# Patient Record
Sex: Female | Born: 1990 | Race: Black or African American | Hispanic: No | Marital: Single | Smoking: Never smoker
Health system: Southern US, Community
[De-identification: ages and names within clinical notes are randomized; demographics above are authoritative.]

## PROBLEM LIST (undated history)

## (undated) DIAGNOSIS — J069 Acute upper respiratory infection, unspecified: Secondary | ICD-10-CM

## (undated) DIAGNOSIS — J45909 Unspecified asthma, uncomplicated: Secondary | ICD-10-CM

## (undated) DIAGNOSIS — T783XXA Angioneurotic edema, initial encounter: Secondary | ICD-10-CM

## (undated) DIAGNOSIS — L509 Urticaria, unspecified: Secondary | ICD-10-CM

## (undated) DIAGNOSIS — B009 Herpesviral infection, unspecified: Secondary | ICD-10-CM

## (undated) DIAGNOSIS — R569 Unspecified convulsions: Secondary | ICD-10-CM

## (undated) DIAGNOSIS — I959 Hypotension, unspecified: Secondary | ICD-10-CM

## (undated) DIAGNOSIS — F32A Depression, unspecified: Secondary | ICD-10-CM

## (undated) HISTORY — DX: Acute upper respiratory infection, unspecified: J06.9

## (undated) HISTORY — DX: Angioneurotic edema, initial encounter: T78.3XXA

## (undated) HISTORY — DX: Urticaria, unspecified: L50.9

---

## 2014-11-10 ENCOUNTER — Emergency Department (HOSPITAL_COMMUNITY)
Admission: EM | Admit: 2014-11-10 | Discharge: 2014-11-10 | Disposition: A | Discharge status: Home / Self Care | Payer: Self-pay | Attending: Emergency Medicine | Admitting: Emergency Medicine

## 2014-11-10 ENCOUNTER — Encounter (HOSPITAL_COMMUNITY): Payer: Self-pay | Admitting: Emergency Medicine

## 2014-11-10 ENCOUNTER — Emergency Department (HOSPITAL_COMMUNITY): Payer: Self-pay

## 2014-11-10 DIAGNOSIS — Z3202 Encounter for pregnancy test, result negative: Secondary | ICD-10-CM | POA: Insufficient documentation

## 2014-11-10 DIAGNOSIS — R0789 Other chest pain: Secondary | ICD-10-CM | POA: Insufficient documentation

## 2014-11-10 DIAGNOSIS — Z7951 Long term (current) use of inhaled steroids: Secondary | ICD-10-CM | POA: Insufficient documentation

## 2014-11-10 DIAGNOSIS — R0781 Pleurodynia: Secondary | ICD-10-CM

## 2014-11-10 DIAGNOSIS — Z79899 Other long term (current) drug therapy: Secondary | ICD-10-CM | POA: Insufficient documentation

## 2014-11-10 DIAGNOSIS — J45901 Unspecified asthma with (acute) exacerbation: Secondary | ICD-10-CM | POA: Insufficient documentation

## 2014-11-10 HISTORY — DX: Unspecified asthma, uncomplicated: J45.909

## 2014-11-10 LAB — COMPREHENSIVE METABOLIC PANEL
ALT: 15 U/L (ref 14–54)
AST: 19 U/L (ref 15–41)
Albumin: 3.9 g/dL (ref 3.5–5.0)
Alkaline Phosphatase: 51 U/L (ref 38–126)
Anion gap: 8 (ref 5–15)
BUN: 12 mg/dL (ref 6–20)
CO2: 26 mmol/L (ref 22–32)
Calcium: 9.1 mg/dL (ref 8.9–10.3)
Chloride: 104 mmol/L (ref 101–111)
Creatinine, Ser: 0.81 mg/dL (ref 0.44–1.00)
GFR calc Af Amer: >60 mL/min (ref >60)
GFR calc non Af Amer: >60 mL/min (ref >60)
Glucose, Bld: 87 mg/dL (ref 65–99)
Potassium: 3.8 mmol/L (ref 3.5–5.1)
Sodium: 138 mmol/L (ref 135–145)
Total Bilirubin: 0.6 mg/dL (ref 0.3–1.2)
Total Protein: 7.2 g/dL (ref 6.5–8.1)

## 2014-11-10 LAB — D-DIMER, QUANTITATIVE: D-Dimer, Quant: 0.42 ug/mL-FEU (ref 0.00–0.48)

## 2014-11-10 LAB — CBC WITH DIFFERENTIAL/PLATELET
Basophils Absolute: 0 10*3/uL (ref 0.0–0.1)
Basophils Relative: 0 % (ref 0–1)
Eosinophils Absolute: 0.2 10*3/uL (ref 0.0–0.7)
Eosinophils Relative: 4 % (ref 0–5)
HCT: 37.5 % (ref 36.0–46.0)
Hemoglobin: 12.4 g/dL (ref 12.0–15.0)
Lymphocytes Relative: 38 % (ref 12–46)
Lymphs Abs: 2.1 10*3/uL (ref 0.7–4.0)
MCH: 27.1 pg (ref 26.0–34.0)
MCHC: 33.1 g/dL (ref 30.0–36.0)
MCV: 81.9 fL (ref 78.0–100.0)
Monocytes Absolute: 0.4 10*3/uL (ref 0.1–1.0)
Monocytes Relative: 7 % (ref 3–12)
Neutro Abs: 2.8 10*3/uL (ref 1.7–7.7)
Neutrophils Relative %: 51 % (ref 43–77)
Platelets: 288 10*3/uL (ref 150–400)
RBC: 4.58 MIL/uL (ref 3.87–5.11)
RDW: 12.7 % (ref 11.5–15.5)
WBC: 5.6 10*3/uL (ref 4.0–10.5)

## 2014-11-10 LAB — I-STAT TROPONIN, ED: Troponin i, poc: 0 ng/mL (ref 0.00–0.08)

## 2014-11-10 LAB — POC URINE PREG, ED: Preg Test, Ur: NEGATIVE

## 2014-11-10 MED ORDER — IPRATROPIUM-ALBUTEROL 0.5-2.5 (3) MG/3ML IN SOLN
3.0000 mL | Freq: Once | RESPIRATORY_TRACT | Status: AC
  Administered 2014-11-10: 3 mL via RESPIRATORY_TRACT
  Filled 2014-11-10: qty 3

## 2014-11-10 MED ORDER — TRAMADOL HCL 50 MG PO TABS: 50.0000 mg | ORAL_TABLET | Freq: Four times a day (QID) | ORAL | Status: DC | PRN

## 2014-11-10 MED ORDER — OXYCODONE-ACETAMINOPHEN 5-325 MG PO TABS
1.0000 | ORAL_TABLET | Freq: Once | ORAL | Status: AC
  Administered 2014-11-10: 1 via ORAL
  Filled 2014-11-10: qty 1

## 2014-11-10 NOTE — Discharge Instructions (Signed)
°Emergency Department Resource Guide °1) Find a Doctor and Pay Out of Pocket °Although you won't have to find out who is covered by your insurance plan, it is a good idea to ask around and get recommendations. You will then need to call the office and see if the doctor you have chosen will accept you as a new patient and what types of options they offer for patients who are self-pay. Some doctors offer discounts or will set up payment plans for their patients who do not have insurance, but you will need to ask so you aren't surprised when you get to your appointment. ° °2) Contact Your Local Health Department °Not all health departments have doctors that can see patients for sick visits, but many do, so it is worth a call to see if yours does. If you don't know where your local health department is, you can check in your phone book. The CDC also has a tool to help you locate your state's health department, and many state websites also have listings of all of their local health departments. ° °3) Find a Walk-in Clinic °If your illness is not likely to be very severe or complicated, you may want to try a walk in clinic. These are popping up all over the country in pharmacies, drugstores, and shopping centers. They're usually staffed by nurse practitioners or physician assistants that have been trained to treat common illnesses and complaints. They're usually fairly quick and inexpensive. However, if you have serious medical issues or chronic medical problems, these are probably not your best option. ° °No Primary Care Doctor: °- Call [facility] at  832-8000 - they can help you locate a primary care doctor that  accepts your insurance, provides certain services, etc. °- Physician Referral Service- 1-800-533-3463 ° °Chronic Pain Problems: °Organization         Address  Phone   Notes  °[facility]  (336) 297-2271 Patients need to be referred by their primary care doctor.  ° °Medication  Assistance: °Organization         Address  Phone   Notes  °[place] [facility] [address] °[place], [place] [address] (336) 641-8030 --Must be a resident of [place] °-- Must have NO insurance coverage whatsoever (no Medicaid/ Medicare, etc.) °-- The pt. MUST have a primary care doctor that directs their care regularly and follows them in the community °  °[facility]  (866) 331-1348   °[employer]  (888) 892-1162   ° °Agencies that provide inexpensive medical care: °Organization         Address  Phone   Notes  °[facility]  (336) 832-8035   °[facility]    (336) 832-7272   °[facility] [address] °[place], [place] [address] (336) 832-4777   °[facility] [address], °[place] (336) 271-4999   °[facility]    (336) 373-0678   °[facility]    (336) 272-1050   °[facility] ° [address], [place] Phone:  (336) 832-4444, Fax:  (336) 832-4440 Hours of Operation:  9 am - 6 pm, M-F.  Also accepts Medicaid/Medicare and self-pay.  °[facility] ° [address],  Phone: (336) 832-3150, Fax: (336) 832-3151. Hours of Operation:  8:30 am - 5:30 pm, M-F.  Also accepts Medicaid and self-pay.  °[facility] [place] [address], [place] Phone: (336) 878-6027   °[facility] [address], [place], [place] (336)723-1848, Ext. 123 Mondays & Thursdays: 7-9 AM.  First 15 patients are seen on a first come, first serve basis. °  ° °Medicaid-accepting [place] Providers: ° °Organization         Address  Phone   Notes  °[facility] [address], [place] (336) 641-2100 Also accepts self-pay patients.  °[facility] [address], [place] ° (336) 856-9996   °[facility] [address], [place]  (336) 288-8857   °[facility] [address] [place] [address], [place] (336) 299-7000   °Veita Bland [address], [place]  ° (336) 373-1557 Only accepts [place] Access Medicaid patients after they have their name applied to their card.  ° °Self-Pay (no insurance) in [place]: ° °Organization         Address  Phone   Notes  °Sickle Cell Patients, [facility] [address], [place] (336) 832-1970   °[facility] [address], [place] (336) 832-4400   °[facility] ° [address], [place] (336) 992-4800   °[facility]/Dr. Osei-Bonsu ° [address] [place] [address], [place] or [address], [place] (336) 841-8500 Phone number for both [place] and [place] locations is the same.  °[facility] [address], [place] (336) 299-0000   °[facility] [address] [place] [address], [place] or [address] (336) 852-7530 °(336) 878-2260   °[facility] [address], [place] (336) 350-1642, phone; (336) 294-5005, fax Sees patients 1st and 3rd Saturday of every month.  Must not qualify for public or private insurance (i.e. Medicaid, Medicare, [place] Health Choice, Veterans' Benefits) • Household income should be no more than 200% of the poverty level •The clinic cannot treat you if you are pregnant or think you are pregnant • Sexually transmitted diseases are not treated at the clinic.  ° ° °Dental Care: °Organization         Address  Phone  Notes  °[place] [facility] [address], [place] (336) 641-6152 Accepts children up to age 21 who are enrolled in Medicaid or [place] Health Choice; pregnant women with a Medicaid card; and children who have applied for Medicaid or [place] Health Choice, but were declined, whose parents can pay a reduced fee at time of service.  °[place]  [facility] [place]  [address], [place] (336) 641-7733 Accepts children up to age 21 who are enrolled in Medicaid or [place] Health Choice; pregnant women with a Medicaid card; and children who have applied for Medicaid or  Health Choice, but were declined, whose parents can pay a reduced fee at time of service.  °[facility] ° [address], [place] (336) 641-4533 Patients are seen by appointment only. Walk-ins are not accepted. [facility] will see patients 18 years of age and older. °Monday - Tuesday (8am-5pm) °Most Wednesdays (8:30-5pm) °$30 per visit, cash only  °[facility] ° [address], [place] (336) 641-4533 Patients are seen by appointment only. Walk-ins are not accepted. [facility] will see patients 18 years of age and older. °One   Wednesday Evening (Monthly: Volunteer Based).  $30 per visit, cash only  °[facility]  (919) 537-3737 for adults; Children under age 4, call Graduate Pediatric Dentistry at (919) [phone]. Children aged 4-14, please call (919) [phone] to request a pediatric application. ° Dental services are provided in all areas of dental care including fillings, crowns and bridges, complete and partial dentures, implants, gum treatment, root canals, and extractions. Preventive care is also provided. Treatment is provided to both adults and children. °Patients are selected via a lottery and there is often a waiting list. °  °[facility] [address], °[place] ° (336) 763-8833 [email or web address] °  °[facility] [address], [place], [place] (336)723-1848, Ext. 123 Second and Fourth Thursday of each month, opens at 6:30 AM; Clinic ends at 9 AM.  Patients are seen on a first-come first-served basis, and a limited number are seen during each clinic.  ° °[facility] ° [address], [place], [place] (336) 723-7904    Eligibility Requirements °You must have lived in [place], [place], or [place] counties for at least the last three months. °  You cannot be eligible for state or federal sponsored healthcare insurance, including Veterans Administration, Medicaid, or Medicare. °  You generally cannot be eligible for healthcare insurance through your employer.  °  How to apply: °Eligibility screenings are held every Tuesday and Wednesday afternoon from 1:00 pm until 4:00 pm. You do not need an appointment for the interview!  °[facility] [address], [place], [place] 336-631-2330   °[facility] [facility]  336-342-8273   °[facility]  336-703-3100   °[facility]  336-570-6415   ° °Behavioral Health Resources in the Community: °Intensive Outpatient Programs °Organization         Address  Phone  Notes  °[facility] [address], [place], [place] 336-878-6098   °[facility] [address], [place], [place] 336-832-9800   °[facility] [address], [place], [place] ° 336-882-2125   °[facility] [address],  °[place], [place] 1-800-853-5163 or 336-641-4981   °Substance Abuse Resources °Organization         Address  Phone  Notes  °[facility]  336-882-2125   °[facility]  336-784-9470   °[facility]  336-285-9073   °[facility]  336-845-3988   °Residential & Outpatient Substance Abuse Program  1-800-659-3381   °Psychological Services °Organization         Address  Phone  Notes  °[facility]  336- 832-9600   °[facility]  336- 378-7881   °[facility] [address], [place] 1-800-853-5163 or 336-641-4981   ° °Mobile Crisis Teams °Organization         Address  Phone  Notes  °[facility], Mobile Crisis Care Unit  1-877-626-1772   °Assertive °[facility] ° [address]  [place], [place] 336-834-9664   °Sharon DeEsch [address] °[place] 336-554-5454   ° °Self-Help/Support Groups °Organization         Address  Phone             Notes  °[facility]  - variety of support groups  336- 373-1402 Call for more information  °Narcotics Anonymous (NA), [facility] [address], °[place]   2 meetings at this location  ° °  Residential Treatment Programs °Organization         Address  Phone  Notes  °[facility] [address],    °[place]  1-866-801-8205   °[facility] ° [address], [place], [place] 704-293-8524   °[facility] [address], [place] 336-845-3988 Admissions: 8am-3pm M-F  °[facility] [address]    °[place], [place] 336-841-1104   °[facility] [address], [place], [place] 336-379-7146   °[facility] [address]  °[place], [place] 336-285-9073   °[facility] Intensive Outpatient [address], [place], [place] 336-852-3033   °[facility] [address]  °[place], [place] 1-877-615-2722 or 336-784-9470   °[facility] [address] [place], [place] 336-227-7417 Accepts Medicaid  °[facility] [address]  °[place] 1-800-659-3381 Substance Abuse/Addiction Treatment  ° °[place] Behavioral Health Resources °Organization         Address  Phone  Notes  °[facility]  (888) 581-9988   °Julie Brannon, PhD [address] [place], [place]   (336) 349-5553 or (336) 951-0000   °[facility]   [address] °[place], [place] (336) 349-4454   °[facility] [address], [place], [place] (336) 342-8316 Insurance/Medicaid/sponsorship through [facility]  °[facility] [address]                                    [place], [place] (336) 342-8316 Therapy/tele-psych/case    °[facility] [address]  ° [place], [place] (336) 349-2233    °Dr. Arfeen  (336) 349-4544   °[facility] [place]  [employer] [place] [facility] 1) [address], [place] °2) [address], [place] °3)  [address], [place] (336) 349-3220 °(336) 342-7768 ° °(336) 342-8140   °[place] [facility] (336) 342-1394 or (336) 342-3537 (After Hours)    ° ° °

## 2014-11-10 NOTE — ED Provider Notes (Signed)
CSN: [id number]     Arrival date & time 11/10/14  0827 History   First MD Initiated Contact with Patient 11/10/14 [phone]     Chief Complaint  Patient presents with  . Asthma     (Consider location/radiation/quality/duration/timing/severity/associated sxs/prior Treatment) Patient is a 24 y.o. female presenting with asthma and shortness of breath. The history is provided by the patient.  Asthma Associated symptoms include chest pain and shortness of breath. Pertinent negatives include no abdominal pain and no headaches.  Shortness of Breath Severity:  Mild Onset quality:  Sudden Duration:  1 day Timing:  Constant Progression:  Worsening Chronicity:  Recurrent Context comment:  At rest Relieved by:  Nothing Worsened by:  Nothing tried Ineffective treatments: inhaler, ibuprofen. Associated symptoms: chest pain and cough   Associated symptoms: no abdominal pain, no fever, no headaches, no neck pain and no vomiting     Past Medical History  Diagnosis Date  . Asthma    Past Surgical History  Procedure Laterality Date  . Cesarean section     No family history on file. History  Substance Use Topics  . Smoking status: Never Smoker   . Smokeless tobacco: Not on file  . Alcohol Use: No   OB History    No data available     Review of Systems  Constitutional: Negative for fever and fatigue.  HENT: Negative for congestion and drooling.   Eyes: Negative for pain.  Respiratory: Positive for cough and shortness of breath.   Cardiovascular: Positive for chest pain.  Gastrointestinal: Negative for nausea, vomiting, abdominal pain and diarrhea.  Genitourinary: Negative for dysuria and hematuria.  Musculoskeletal: Negative for back pain, gait problem and neck pain.  Skin: Negative for color change.  Neurological: Negative for dizziness and headaches.  Hematological: Negative for adenopathy.  Psychiatric/Behavioral: Negative for behavioral problems.  All other systems reviewed and  are negative.     Allergies  Effexor  Home Medications   Prior to Admission medications   Medication Sig Start Date End Date Taking? Authorizing Provider  albuterol (PROVENTIL HFA;VENTOLIN HFA) 108 (90 BASE) MCG/ACT inhaler Inhale 2 puffs into the lungs every 6 (six) hours as needed for wheezing or shortness of breath.   Yes Historical Provider, MD  budesonide-formoterol (SYMBICORT) 160-4.5 MCG/ACT inhaler Inhale 2 puffs into the lungs 2 (two) times daily.   Yes Historical Provider, MD  ibuprofen (ADVIL,MOTRIN) 200 MG tablet Take 400 mg by mouth every 6 (six) hours as needed for moderate pain.   Yes Historical Provider, MD   BP 105/64 mmHg  Pulse 63  Temp(Src) 98.4 F (36.9 C) (Oral)  Resp 20  SpO2 100%  LMP 10/19/2014 Physical Exam  Constitutional: She is oriented to person, place, and time. She appears well-developed and well-nourished.  HENT:  Head: Normocephalic.  Mouth/Throat: Oropharynx is clear and moist. No oropharyngeal exudate.  Eyes: Conjunctivae and EOM are normal. Pupils are equal, round, and reactive to light.  Neck: Normal range of motion. Neck supple.  Cardiovascular: Normal rate, regular rhythm, normal heart sounds and intact distal pulses.  Exam reveals no gallop and no friction rub.   No murmur heard. Pulmonary/Chest: Effort normal and breath sounds normal. No respiratory distress. She has no wheezes. She exhibits tenderness (mild tenderness to palpation of the left anterior chest below the left breast.).  Abdominal: Soft. Bowel sounds are normal. There is no tenderness. There is no rebound and no guarding.  Musculoskeletal: Normal range of motion. She exhibits no edema or tenderness.  Neurological: She is alert and oriented to person, place, and time.  Skin: Skin is warm and dry.  Psychiatric: She has a normal mood and affect. Her behavior is normal.  Nursing note and vitals reviewed.   ED Course  Procedures (including critical care time) Labs  Review Labs Reviewed  CBC WITH DIFFERENTIAL/PLATELET  COMPREHENSIVE METABOLIC PANEL  D-DIMER, QUANTITATIVE (NOT AT [facility])  POC URINE PREG, ED  Rosezena SensorI-STAT TROPOININ, ED    Imaging Review Dg Chest 2 View  11/10/2014   CLINICAL DATA:  Shortness of breath and left-sided chest pain for 1 day  EXAM: CHEST  2 VIEW  COMPARISON:  None.  FINDINGS: Lungs are clear. Heart size and pulmonary vascularity are normal. No adenopathy. No pneumothorax. No bone lesions. There is slight upper lumbar levoscoliosis.  IMPRESSION: No abnormality noted beyond slight upper lumbar levoscoliosis.   Electronically Signed   By: Bretta BangWilliam  Woodruff III M.D.   On: 11/10/2014 09:27     EKG Interpretation   Date/Time:  Wednesday November 10 2014 09:32:30 EDT Ventricular Rate:  63 PR Interval:  141 QRS Duration: 89 QT Interval:  421 QTC Calculation: 431 R Axis:   33 Text Interpretation:  Sinus rhythm Atrial premature complexes in couplets  Confirmed by Thanos Cousineau  MD, Marvine Encalade ([id number]) on 11/10/2014 9:44:35 AM      MDM   Final diagnoses:  Pleuritic pain    9:18 AM 24 y.o. female with a history of asthma and pleurisy who presents with left-sided pleuritic chest pain under her left breast since yesterday. She notes she has had similar symptoms several times per month since she was 24 years old. She also notes a history of a blood clot in her leg previously. She is not on anticoagulation. She states the pain started yesterday suddenly while at work. She initially had some wheezing but this resolved with albuterol inhaler. Vital signs unremarkable here. Low risk wells. Will get labs/imaging, pain control, d-dimer. Lung sounds clear.   12:53 PM: I interpreted/reviewed the labs and/or imaging which were non-contributory.  Pt continues to appear well.  I have discussed the diagnosis/risks/treatment options with the patient and believe the pt to be eligible for discharge home to follow-up with and estab w/ a pcp. We also discussed  returning to the ED immediately if new or worsening sx occur. We discussed the sx which are most concerning (e.g., worsening pain, sob, fever) that necessitate immediate return. Medications administered to the patient during their visit and any new prescriptions provided to the patient are listed below.  Medications given during this visit Medications  ipratropium-albuterol (DUONEB) 0.5-2.5 (3) MG/3ML nebulizer solution 3 mL (3 mLs Nebulization Given 11/10/14 0940)  oxyCODONE-acetaminophen (PERCOCET/ROXICET) 5-325 MG per tablet 1 tablet (1 tablet Oral Given 11/10/14 0929)    New Prescriptions   TRAMADOL (ULTRAM) 50 MG TABLET    Take 1 tablet (50 mg total) by mouth every 6 (six) hours as needed.     Purvis SheffieldForrest Columbus Ice, MD 11/10/14 1254

## 2014-11-10 NOTE — ED Notes (Signed)
Per pt, history of asthma, states she was diagnosed with pleurisy-recently moved here from [place]-wheezing and chest tightness

## 2014-11-12 ENCOUNTER — Emergency Department (HOSPITAL_COMMUNITY)
Admission: EM | Admit: 2014-11-12 | Discharge: 2014-11-12 | Disposition: A | Discharge status: Home / Self Care | Payer: Self-pay | Attending: Emergency Medicine | Admitting: Emergency Medicine

## 2014-11-12 ENCOUNTER — Encounter (HOSPITAL_COMMUNITY): Payer: Self-pay | Admitting: Emergency Medicine

## 2014-11-12 DIAGNOSIS — B9689 Other specified bacterial agents as the cause of diseases classified elsewhere: Secondary | ICD-10-CM

## 2014-11-12 DIAGNOSIS — R0781 Pleurodynia: Secondary | ICD-10-CM

## 2014-11-12 DIAGNOSIS — Z3202 Encounter for pregnancy test, result negative: Secondary | ICD-10-CM | POA: Insufficient documentation

## 2014-11-12 DIAGNOSIS — N760 Acute vaginitis: Secondary | ICD-10-CM | POA: Insufficient documentation

## 2014-11-12 DIAGNOSIS — Z79899 Other long term (current) drug therapy: Secondary | ICD-10-CM | POA: Insufficient documentation

## 2014-11-12 DIAGNOSIS — J45901 Unspecified asthma with (acute) exacerbation: Secondary | ICD-10-CM | POA: Insufficient documentation

## 2014-11-12 DIAGNOSIS — R0789 Other chest pain: Secondary | ICD-10-CM | POA: Insufficient documentation

## 2014-11-12 LAB — URINALYSIS, ROUTINE W REFLEX MICROSCOPIC
Bilirubin Urine: NEGATIVE
Glucose, UA: NEGATIVE mg/dL
Ketones, ur: NEGATIVE mg/dL
Nitrite: NEGATIVE
Protein, ur: NEGATIVE mg/dL
Specific Gravity, Urine: 1.019 (ref 1.005–1.030)
Urobilinogen, UA: 0.2 mg/dL (ref 0.0–1.0)
pH: 6.5 (ref 5.0–8.0)

## 2014-11-12 LAB — URINE MICROSCOPIC-ADD ON

## 2014-11-12 LAB — WET PREP, GENITAL
Trich, Wet Prep: NONE SEEN
Yeast Wet Prep HPF POC: NONE SEEN

## 2014-11-12 LAB — PREGNANCY, URINE: Preg Test, Ur: NEGATIVE

## 2014-11-12 MED ORDER — IPRATROPIUM-ALBUTEROL 0.5-2.5 (3) MG/3ML IN SOLN
3.0000 mL | RESPIRATORY_TRACT | Status: DC
  Administered 2014-11-12: 3 mL via RESPIRATORY_TRACT
  Filled 2014-11-12: qty 3

## 2014-11-12 MED ORDER — METRONIDAZOLE 500 MG PO TABS: 500.0000 mg | ORAL_TABLET | Freq: Two times a day (BID) | ORAL | Status: DC

## 2014-11-12 MED ORDER — ALBUTEROL SULFATE HFA 108 (90 BASE) MCG/ACT IN AERS
2.0000 | INHALATION_SPRAY | Freq: Once | RESPIRATORY_TRACT | Status: AC
  Administered 2014-11-12: 2 via RESPIRATORY_TRACT
  Filled 2014-11-12: qty 6.7

## 2014-11-12 MED ORDER — OXYCODONE-ACETAMINOPHEN 5-325 MG PO TABS: 1.0000 | ORAL_TABLET | Freq: Once | ORAL | Status: DC

## 2014-11-12 NOTE — ED Notes (Signed)
Unable to obtain labs due to pt not laying still in bed rn aware

## 2014-11-12 NOTE — ED Notes (Signed)
RN and phlebotomist attempted lab draw.

## 2014-11-12 NOTE — ED Provider Notes (Signed)
CSN: [id number]     Arrival date & time 11/12/14  1734 History   First MD Initiated Contact with Patient 11/12/14 1744     Chief Complaint  Patient presents with  . Chest Pain  . Vaginal Discharge     (Consider location/radiation/quality/duration/timing/severity/associated sxs/prior Treatment) HPI Danielle Velez is a 24 y.o. female who comes in for evaluation of chest pain and vaginal discharge. Patient states that she has had "pleuritic chest pain" chronically throughout her life, was seen in the ED 2 days ago and diagnosed with the same. She reports left-sided rib pain under her axilla that is exactly the same as her previous pleuritic chest pain. She denies any nausea, vomiting, diaphoresis, or shortness of breath. She also reports a history of asthma and is out of her albuterol inhaler. She also reports associated vaginal discharge that she just noted recently. Characterizes a white thick discharge without any overt smell. Last menstrual period June 5 and was "spotty" she attributes this to recently coming off birth control.  Past Medical History  Diagnosis Date  . Asthma    Past Surgical History  Procedure Laterality Date  . Cesarean section     History reviewed. No pertinent family history. History  Substance Use Topics  . Smoking status: Never Smoker   . Smokeless tobacco: Not on file  . Alcohol Use: No   OB History    No data available     Review of Systems A 10 point review of systems was completed and was negative except for pertinent positives and negatives as mentioned in the history of present illness     Allergies  Effexor  Home Medications   Prior to Admission medications   Medication Sig Start Date End Date Taking? Authorizing Provider  albuterol (PROVENTIL HFA;VENTOLIN HFA) 108 (90 BASE) MCG/ACT inhaler Inhale 2 puffs into the lungs every 6 (six) hours as needed for wheezing or shortness of breath.   Yes Historical Provider, MD  budesonide-formoterol  (SYMBICORT) 160-4.5 MCG/ACT inhaler Inhale 2 puffs into the lungs 2 (two) times daily.   Yes Historical Provider, MD  ibuprofen (ADVIL,MOTRIN) 200 MG tablet Take 400 mg by mouth every 6 (six) hours as needed for moderate pain.   Yes Historical Provider, MD  traMADol (ULTRAM) 50 MG tablet Take 1 tablet (50 mg total) by mouth every 6 (six) hours as needed. 11/10/14  Yes Purvis Sheffield, MD  metroNIDAZOLE (FLAGYL) 500 MG tablet Take 1 tablet (500 mg total) by mouth 2 (two) times daily. 11/12/14   Joycie Peek, PA-C   BP 123/76 mmHg  Pulse 73  Temp(Src) 98.5 F (36.9 C) (Oral)  Resp 16  SpO2 100%  LMP 10/19/2014 Physical Exam  Constitutional: She is oriented to person, place, and time. She appears well-developed and well-nourished.  HENT:  Head: Normocephalic and atraumatic.  Mouth/Throat: Oropharynx is clear and moist.  Eyes: Conjunctivae are normal. Pupils are equal, round, and reactive to light. Right eye exhibits no discharge. Left eye exhibits no discharge. No scleral icterus.  Neck: Neck supple.  Cardiovascular: Normal rate, regular rhythm and normal heart sounds.   Pulmonary/Chest: Effort normal and breath sounds normal. No respiratory distress. She has no rales.  Mild inspiratory wheezing. No evidence of rash or distress. No other adventitious lung sounds  Abdominal: Soft. There is no tenderness.  Genitourinary:  Chaperone was present for the entire genital exam. No lesions or rashes appreciated on vulva. Cervix visualized on speculum exam and appropriate cultures sampled. Scant blood in vaginal vault.  Discharge: mild white Upon bi manual exam- mild bil TTP of the adnexa, no cervical motion tenderness. No fullness or masses appreciated. No abnormalities appreciated in structural anatomy.   Musculoskeletal: She exhibits no tenderness.  Neurological: She is alert and oriented to person, place, and time.  Cranial Nerves II-XII grossly intact  Skin: Skin is warm and dry. No rash  noted.  Psychiatric: She has a normal mood and affect.  Nursing note and vitals reviewed.   ED Course  Procedures (including critical care time) Labs Review Labs Reviewed  WET PREP, GENITAL - Abnormal; Notable for the following:    Clue Cells Wet Prep HPF POC MODERATE (*)    WBC, Wet Prep HPF POC FEW (*)    All other components within normal limits  URINALYSIS, ROUTINE W REFLEX MICROSCOPIC (NOT AT [facility]) - Abnormal; Notable for the following:    APPearance CLOUDY (*)    Hgb urine dipstick TRACE (*)    Leukocytes, UA SMALL (*)    All other components within normal limits  URINE MICROSCOPIC-ADD ON - Abnormal; Notable for the following:    Squamous Epithelial / LPF FEW (*)    All other components within normal limits  PREGNANCY, URINE  GC/CHLAMYDIA PROBE AMP ([place]) NOT AT [facility]    Imaging Review No results found.   EKG Interpretation   Date/Time:  Friday November 12 2014 17:41:11 EDT Ventricular Rate:  75 PR Interval:  136 QRS Duration: 85 QT Interval:  370 QTC Calculation: 413 R Axis:   48 Text Interpretation:  Sinus arrhythmia Probable left atrial enlargement  Low voltage, precordial leads Borderline T abnormalities, anterior leads  When compared with ECG of 11/10/2014 Borderline Nonspecific T wave  abnormality is now present Confirmed by [facility]  MD, Nicholos JohnsKATHLEEN [phone]([id number]) on  11/12/2014 6:25:25 PM     Meds given in ED:  Medications  albuterol (PROVENTIL HFA;VENTOLIN HFA) 108 (90 BASE) MCG/ACT inhaler 2 puff (2 puffs Inhalation Given 11/12/14 2009)    Discharge Medication List as of 11/12/2014  7:58 PM    START taking these medications   Details  metroNIDAZOLE (FLAGYL) 500 MG tablet Take 1 tablet (500 mg total) by mouth 2 (two) times daily., Starting 11/12/2014, Until Discontinued, Print       Filed Vitals:   11/12/14 1739 11/12/14 2040  BP: 101/66 123/76  Pulse: 75 73  Temp: 97.5 F (36.4 C) 98.5 F (36.9 C)  TempSrc: Oral Oral  Resp: 16 16  SpO2: 97% 100%      MDM  Vitals stable - WNL -afebrile Pt resting comfortably in ED. PE--physical exam is not concerning. Normal lung exam. Labwork--moderate clue cells on wet prep, otherwise labs noncontributory Patient had full lab workup done 2 days ago for same problem without any any acute changes. D-dimer was negative. Chest x-ray with no acute cardio pulmonary pathology  DDX--patient reports discomfort that is chronic for her and unchanged from previous episodes. She is only concerned that the pain medicine that she is using now has not relieving her pain. Encouraged her to follow-up with primary care and continue to use medicine she received in ED 2 days ago. Will treat BV and DC with albuterol inhaler. [facility] the patient may establish primary care.  I discussed all relevant lab findings and imaging results with pt and they verbalized understanding. Discussed f/u with PCP within 48 hrs and return precautions, pt very amenable to plan.  Final diagnoses:  BV (bacterial vaginosis)  Pleuritic chest pain      Joycie Peek, PA-C 11/12/14 2349  Samuel Jester, DO 11/15/14 [phone]

## 2014-11-12 NOTE — ED Notes (Signed)
Patient ambulated to room from triage independently without difficulty.

## 2014-11-12 NOTE — ED Notes (Signed)
Pt reports L side CP since Wednesday with SOB. This am L hand was numb. Pt speaking in full sentences with no difficulty. Also reports thick vaginal discharge.

## 2014-11-12 NOTE — Progress Notes (Signed)
EDCM spoke to patient at bedside. Patient confirms she does not have a pcp or insurance living in [place].  EDCM provided patient with pamphlet to [facility], informed patient of services there and walk in times.  EDCM also provided patient with list of pcps who accept self pay patients, list of discount pharmacies and websites needymeds.org and GoodRX.com for medication assistance, phone number to inquire about the orange card, phone number to inquire about Mediciad, phone number to inquire about the Affordable Care Act, financial resources in the community such as local churches, [employer], urban ministries, and dental assistance for uninsured patients.  Patient thankful for resources.  No further EDCM needs at this time. 

## 2014-11-12 NOTE — ED Notes (Signed)
Lab called to draw blood

## 2014-11-12 NOTE — Discharge Instructions (Signed)
Bacterial Vaginosis Bacterial vaginosis is a vaginal infection that occurs when the normal balance of bacteria in the vagina is disrupted. It results from an overgrowth of certain bacteria. This is the most common vaginal infection in women of childbearing age. Treatment is important to prevent complications, especially in pregnant women, as it can cause a premature delivery. CAUSES  Bacterial vaginosis is caused by an increase in harmful bacteria that are normally present in smaller amounts in the vagina. Several different kinds of bacteria can cause bacterial vaginosis. However, the reason that the condition develops is not fully understood. RISK FACTORS Certain activities or behaviors can put you at an increased risk of developing bacterial vaginosis, including:  Having a new sex partner or multiple sex partners.  Douching.  Using an intrauterine device (IUD) for contraception. Women do not get bacterial vaginosis from toilet seats, bedding, swimming pools, or contact with objects around them. SIGNS AND SYMPTOMS  Some women with bacterial vaginosis have no signs or symptoms. Common symptoms include:  Grey vaginal discharge.  A fishlike odor with discharge, especially after sexual intercourse.  Itching or burning of the vagina and vulva.  Burning or pain with urination. DIAGNOSIS  Your health care provider will take a medical history and examine the vagina for signs of bacterial vaginosis. A sample of vaginal fluid may be taken. Your health care provider will look at this sample under a microscope to check for bacteria and abnormal cells. A vaginal pH test may also be done.  TREATMENT  Bacterial vaginosis may be treated with antibiotic medicines. These may be given in the form of a pill or a vaginal cream. A second round of antibiotics may be prescribed if the condition comes back after treatment.  HOME CARE INSTRUCTIONS   Only take over-the-counter or prescription medicines as  directed by your health care provider.  If antibiotic medicine was prescribed, take it as directed. Make sure you finish it even if you start to feel better.  Do not have sex until treatment is completed.  Tell all sexual partners that you have a vaginal infection. They should see their health care provider and be treated if they have problems, such as a mild rash or itching.  Practice safe sex by using condoms and only having one sex partner. SEEK MEDICAL CARE IF:   Your symptoms are not improving after 3 days of treatment.  You have increased discharge or pain.  You have a fever. MAKE SURE YOU:   Understand these instructions.  Will watch your condition.  Will get help right away if you are not doing well or get worse. FOR MORE INFORMATION  Centers for Disease Control and Prevention, Division of STD Prevention: SolutionApps.co.zawww.cdc.gov/std American Sexual Health Association (ASHA): www.ashastd.org  Document Released: 04/30/2005 Document Revised: 02/18/2013 Document Reviewed: 12/10/2012 [facility] Patient Information 2015 [employer], [place]. This information is not intended to replace advice given to you by your health care provider. Make sure you discuss any questions you have with your health care provider.  Pleurodynia Pleurodynia is a sharp pain in the muscles between your ribs (intercostal muscles). This condition makes it painful to breathe. Pleurodynia is sometimes described as an iron grip around the rib cage. Pleurodynia attacks are unpredictable. CAUSES  Pleurodynia is commonly caused by a viral infection. A virus, called coxsackievirus B, attacks the intercostal muscles. However, getting pleurodynia from this virus is rare. Most people infected with coxsackievirus B have no symptoms. In some people, the virus causes a mild sore throat, cough,  or diarrhea. Coxsackievirus B can live in body fluids, such as saliva and mucus. It is easily spread from person to person through coughing or  sneezing. Coming in contact with the stool of an infected person can also spread the virus. SYMPTOMS  Symptoms usually start 3 to 6 days after you have been infected with the virus. Very bad chest pain is the main symptom of pleurodynia. The pain is usually felt on one side of the body, along the lower ribs. It starts suddenly and may last from a few seconds to 1 minute. It is hard to breathe when the pain strikes. You might feel pain again a few minutes or hours later. In most cases, the pain keeps coming back for 3 to 5 days. Then it goes away. In some cases, the pain keeps coming back every so often for up to 1 month. Other symptoms of pleurodynia may include:   Fever.  Rapid heartbeat.  Sore throat.  Cough.  Headache.  Stomach pain.  Nausea.  Vomiting.  Diarrhea.  Feeling very tired.  Rash.  For males, pain in the testicles. DIAGNOSIS  If you have had very bad chest pain, your caregiver will probably order some tests to determine whether you have pleurodynia. These tests may include:  A throat swab. Your caregiver may rub the back of your throat with a cotton swab. The cotton swab can then be tested for coxsackievirus B.  Urine and stool samples. These samples will be tested for coxsackievirus B.  Blood tests. These tests can tell if you have muscle damage.  Chest X-rays.  Electrocardiography (ECG). This test checks your heartbeat. TREATMENT  There is no treatment for an infection caused by coxsackievirus B. However, pleurodynia usually goes away on its own. It may take up to 1 month to fully recover. You may be given nonsteroidal anti-inflammatory drugs (NSAIDs) to control your pain. If your chest pain continues, you may need to see a pain specialist to discuss possibly using nerve block injections to relieve your pain. HOME CARE INSTRUCTIONS  Only take over-the-counter or prescription medicines for pain, fever, or discomfort as directed by your caregiver.  Return  to your regular activities slowly.  Wash your hands often. This helps prevent coxsackievirus B from spreading.  Do not smoke.  Keep all follow-up appointments as directed by your caregiver. SEEK MEDICAL CARE IF:  You have new symptoms.  Your symptoms are getting worse.  You develop a cough.  You have a sore throat.  You have a rash.  You have abdominal pain.  You vomit.  You have diarrhea. SEEK IMMEDIATE MEDICAL CARE IF:  You have very bad chest pain that is getting worse.  You have trouble breathing.  You have a fever. MAKE SURE YOU:  Understand these instructions.  Will watch your condition.  Will get help right away if you are not doing well or get worse. Document Released: 04/19/2011 Document Revised: 07/23/2011 Document Reviewed: 04/19/2011 [facility] Patient Information 2015 [place], [place]. This information is not intended to replace advice given to you by your health care provider. Make sure you discuss any questions you have with your health care provider.

## 2014-11-16 ENCOUNTER — Telehealth (HOSPITAL_BASED_OUTPATIENT_CLINIC_OR_DEPARTMENT_OTHER): Payer: Self-pay | Admitting: Emergency Medicine

## 2014-11-16 LAB — GC/CHLAMYDIA PROBE AMP (~~LOC~~) NOT AT ARMC
Chlamydia: POSITIVE — AB
Neisseria Gonorrhea: NEGATIVE

## 2014-11-16 NOTE — Telephone Encounter (Signed)
Chart handoff to EDP for treatment plan + chlamydia

## 2014-11-17 ENCOUNTER — Telehealth (HOSPITAL_COMMUNITY): Payer: Self-pay

## 2014-11-17 NOTE — ED Notes (Signed)
Spoke with pt. Verified Id. Informed of lab results. Medication called to [facility] on [address].  Advised to abstain from sexual activity x 10 days after taking medication and to notify partner(s) for testing and treatment.

## 2014-11-17 NOTE — ED Notes (Signed)
Chart returned from EDP office. Chart reviewed by Ebbie Ridgehris Lawyer. Pt needs zithromax 1 gram po x 1. Attempting to contact pt.

## 2015-02-26 ENCOUNTER — Encounter (HOSPITAL_COMMUNITY): Payer: Self-pay | Admitting: Emergency Medicine

## 2015-02-26 ENCOUNTER — Emergency Department (HOSPITAL_COMMUNITY)
Admission: EM | Admit: 2015-02-26 | Discharge: 2015-02-27 | Disposition: A | Discharge status: Home / Self Care | Payer: BLUE CROSS/BLUE SHIELD | Attending: Emergency Medicine | Admitting: Emergency Medicine

## 2015-02-26 DIAGNOSIS — A5901 Trichomonal vulvovaginitis: Secondary | ICD-10-CM | POA: Insufficient documentation

## 2015-02-26 DIAGNOSIS — J45909 Unspecified asthma, uncomplicated: Secondary | ICD-10-CM | POA: Insufficient documentation

## 2015-02-26 DIAGNOSIS — Z79899 Other long term (current) drug therapy: Secondary | ICD-10-CM | POA: Insufficient documentation

## 2015-02-26 DIAGNOSIS — R51 Headache: Secondary | ICD-10-CM | POA: Insufficient documentation

## 2015-02-26 DIAGNOSIS — R1030 Lower abdominal pain, unspecified: Secondary | ICD-10-CM | POA: Diagnosis present

## 2015-02-26 DIAGNOSIS — Z3202 Encounter for pregnancy test, result negative: Secondary | ICD-10-CM | POA: Diagnosis not present

## 2015-02-26 DIAGNOSIS — N939 Abnormal uterine and vaginal bleeding, unspecified: Secondary | ICD-10-CM | POA: Diagnosis not present

## 2015-02-26 NOTE — ED Notes (Signed)
Pt arrived to the ED with a complaint of lower abdominal pain and cramping.  Pt states she also has been having clear vaginal discharge. Pt also has had slight spotting this am.  Pt complains of a headache. Pt states that he symptoms have been present for 2 days.  Pt states upper leg cramping also has been going on for two days.

## 2015-02-27 LAB — URINALYSIS, ROUTINE W REFLEX MICROSCOPIC
Bilirubin Urine: NEGATIVE
Glucose, UA: NEGATIVE mg/dL
Ketones, ur: NEGATIVE mg/dL
Nitrite: NEGATIVE
Protein, ur: NEGATIVE mg/dL
Specific Gravity, Urine: 1.015 (ref 1.005–1.030)
Urobilinogen, UA: 1 mg/dL (ref 0.0–1.0)
pH: 7 (ref 5.0–8.0)

## 2015-02-27 LAB — CBC
HCT: 38.9 % (ref 36.0–46.0)
Hemoglobin: 13 g/dL (ref 12.0–15.0)
MCH: 27 pg (ref 26.0–34.0)
MCHC: 33.4 g/dL (ref 30.0–36.0)
MCV: 80.7 fL (ref 78.0–100.0)
Platelets: 323 10*3/uL (ref 150–400)
RBC: 4.82 MIL/uL (ref 3.87–5.11)
RDW: 13.4 % (ref 11.5–15.5)
WBC: 7.4 10*3/uL (ref 4.0–10.5)

## 2015-02-27 LAB — COMPREHENSIVE METABOLIC PANEL
ALT: 15 U/L (ref 14–54)
AST: 23 U/L (ref 15–41)
Albumin: 4.4 g/dL (ref 3.5–5.0)
Alkaline Phosphatase: 63 U/L (ref 38–126)
Anion gap: 8 (ref 5–15)
BUN: 15 mg/dL (ref 6–20)
CO2: 23 mmol/L (ref 22–32)
Calcium: 9.2 mg/dL (ref 8.9–10.3)
Chloride: 108 mmol/L (ref 101–111)
Creatinine, Ser: 0.84 mg/dL (ref 0.44–1.00)
GFR calc Af Amer: >60 mL/min (ref >60)
GFR calc non Af Amer: >60 mL/min (ref >60)
Glucose, Bld: 92 mg/dL (ref 65–99)
Potassium: 4.1 mmol/L (ref 3.5–5.1)
Sodium: 139 mmol/L (ref 135–145)
Total Bilirubin: 0.4 mg/dL (ref 0.3–1.2)
Total Protein: 8.2 g/dL — ABNORMAL HIGH (ref 6.5–8.1)

## 2015-02-27 LAB — LIPASE, BLOOD: Lipase: 50 U/L (ref 22–51)

## 2015-02-27 LAB — RAPID HIV SCREEN (HIV 1/2 AB+AG)
HIV 1/2 Antibodies: NONREACTIVE
HIV-1 P24 Antigen - HIV24: NONREACTIVE

## 2015-02-27 LAB — URINE MICROSCOPIC-ADD ON

## 2015-02-27 LAB — WET PREP, GENITAL
Clue Cells Wet Prep HPF POC: NONE SEEN
Yeast Wet Prep HPF POC: NONE SEEN

## 2015-02-27 LAB — RPR: RPR Ser Ql: NONREACTIVE

## 2015-02-27 LAB — POC URINE PREG, ED: Preg Test, Ur: NEGATIVE

## 2015-02-27 MED ORDER — METRONIDAZOLE 500 MG PO TABS: 500.0000 mg | ORAL_TABLET | Freq: Two times a day (BID) | ORAL | Status: DC

## 2015-02-27 MED ORDER — METRONIDAZOLE 500 MG PO TABS
ORAL_TABLET | ORAL | Status: DC
Start: 2015-02-27 — End: 2015-02-27
  Filled 2015-02-27: qty 1

## 2015-02-27 MED ORDER — METRONIDAZOLE 500 MG PO TABS
500.0000 mg | ORAL_TABLET | Freq: Once | ORAL | Status: AC
  Administered 2015-02-27: 500 mg via ORAL

## 2015-02-27 NOTE — Discharge Instructions (Signed)
Trichomoniasis Trichomoniasis is an infection caused by an organism called Trichomonas. The infection can affect both women and men. In women, the outer female genitalia and the vagina are affected. In men, the penis is mainly affected, but the prostate and other reproductive organs can also be involved. Trichomoniasis is a sexually transmitted infection (STI) and is most often passed to another person through sexual contact.  RISK FACTORS  Having unprotected sexual intercourse.  Having sexual intercourse with an infected partner. SIGNS AND SYMPTOMS  Symptoms of trichomoniasis in women include:  Abnormal gray-green frothy vaginal discharge.  Itching and irritation of the vagina.  Itching and irritation of the area outside the vagina. Symptoms of trichomoniasis in men include:   Penile discharge with or without pain.  Pain during urination. This results from inflammation of the urethra. DIAGNOSIS  Trichomoniasis may be found during a Pap test or physical exam. Your health care provider may use one of the following methods to help diagnose this infection:  Testing the pH of the vagina with a test tape.  Using a vaginal swab test that checks for the Trichomonas organism. A test is available that provides results within a few minutes.  Examining a urine sample.  Testing vaginal secretions. Your health care provider may test you for other STIs, including HIV. TREATMENT   You may be given medicine to fight the infection. Women should inform their health care provider if they could be or are pregnant. Some medicines used to treat the infection should not be taken during pregnancy.  Your health care provider may recommend over-the-counter medicines or creams to decrease itching or irritation.  Your sexual partner will need to be treated if infected.  Your health care provider may test you for infection again 3 months after treatment. HOME CARE INSTRUCTIONS   Take medicines only as  directed by your health care provider.  Take over-the-counter medicine for itching or irritation as directed by your health care provider.  Do not have sexual intercourse while you have the infection.  Women should not douche or wear tampons while they have the infection.  Discuss your infection with your partner. Your partner may have gotten the infection from you, or you may have gotten it from your partner.  Have your sex partner get examined and treated if necessary.  Practice safe, informed, and protected sex.  See your health care provider for other STI testing. SEEK MEDICAL CARE IF:   You still have symptoms after you finish your medicine.  You develop abdominal pain.  You have pain when you urinate.  You have bleeding after sexual intercourse.  You develop a rash.  Your medicine makes you sick or makes you throw up (vomit). MAKE SURE YOU:  Understand these instructions.  Will watch your condition.  Will get help right away if you are not doing well or get worse.   This information is not intended to replace advice given to you by your health care provider. Make sure you discuss any questions you have with your health care provider.   Document Released: 10/24/2000 Document Revised: 05/21/2014 Document Reviewed: 02/09/2013 Elsevier Interactive Patient Education 2016 [employer].  

## 2015-02-27 NOTE — ED Provider Notes (Signed)
CSN: [id number]     Arrival date & time 02/26/15  2124 History  By signing my name below, I, Marica Otter, attest that this documentation has been prepared under the direction and in the presence of Azalia Bilis, MD. Electronically Signed: Marica Otter, ED Scribe. 02/27/2015. 12:58 AM.  Chief Complaint  Patient presents with  . Abdominal Pain   The history is provided by the patient. No language interpreter was used.   PCP: No primary care provider on file. HPI Comments: Danielle Velez is a 24 y.o. female, with PMHx noted below, who presents to the Emergency Department with multiple complaints.   First, pt complains of 7/10, sharp/cramping, lower abd pain onset two days ago. Pt notes her menstrual cycle ended recently. Associated sx include intermittent nausea.   Second, pt complains of clear/white vaginal discharge onset two days ago with associated vaginal itching (worsens after urinating) and spotting (one episode yesterday).   Third, pt complains of headache onset two days ago.   Lastly, pt complains of cramping, bilateral upper leg pain onset two days ago.   Pt reports taking ibuprofen and ingesting cranberry juice at home with minimal relief.   Pt denies fever, chills, dysuria, vomiting or any other Sx at this time.   Past Medical History  Diagnosis Date  . Asthma    Past Surgical History  Procedure Laterality Date  . Cesarean section     History reviewed. No pertinent family history. Social History  Substance Use Topics  . Smoking status: Never Smoker   . Smokeless tobacco: None  . Alcohol Use: No   OB History    No data available     Review of Systems  Constitutional: Negative for fever and chills.  Gastrointestinal: Positive for abdominal pain. Negative for vomiting.  Genitourinary: Positive for vaginal bleeding and vaginal discharge. Negative for dysuria.  Musculoskeletal: Positive for myalgias (Upper leg cramping ).  Neurological: Positive for headaches.   All other systems reviewed and are negative.  Allergies  Effexor  Home Medications   Prior to Admission medications   Medication Sig Start Date End Date Taking? Authorizing Provider  albuterol (PROVENTIL HFA;VENTOLIN HFA) 108 (90 BASE) MCG/ACT inhaler Inhale 2 puffs into the lungs every 6 (six) hours as needed for wheezing or shortness of breath.    Historical Provider, MD  budesonide-formoterol (SYMBICORT) 160-4.5 MCG/ACT inhaler Inhale 2 puffs into the lungs 2 (two) times daily.    Historical Provider, MD  ibuprofen (ADVIL,MOTRIN) 200 MG tablet Take 400 mg by mouth every 6 (six) hours as needed for moderate pain.    Historical Provider, MD  metroNIDAZOLE (FLAGYL) 500 MG tablet Take 1 tablet (500 mg total) by mouth 2 (two) times daily. 11/12/14   Joycie Peek, PA-C  traMADol (ULTRAM) 50 MG tablet Take 1 tablet (50 mg total) by mouth every 6 (six) hours as needed. 11/10/14   Purvis Sheffield, MD   Triage Vitals: BP 114/59 mmHg  Pulse 64  Temp(Src) 98.2 F (36.8 C) (Oral)  Resp 16  Ht 5' (1.524 m)  Wt 172 lb (78.019 kg)  BMI 33.59 kg/m2  SpO2 99%  LMP 02/21/2015 (Within Days) Physical Exam  Constitutional: She is oriented to person, place, and time. She appears well-developed and well-nourished. No distress.  HENT:  Head: Normocephalic and atraumatic.  Eyes: EOM are normal.  Neck: Normal range of motion.  Cardiovascular: Normal rate, regular rhythm and normal heart sounds.   Pulmonary/Chest: Effort normal and breath sounds normal.  Abdominal: Soft. She exhibits  no distension. There is tenderness in the suprapubic area.  Genitourinary:   Exam performed by Azalia BilisKevin Haim Hansson, MD,  exam chaperoned Date: 02/27/2015 Pelvic exam: normal external genitalia without evidence of trauma. VULVA: normal appearing vulva with no masses, tenderness or lesion. VAGINA: normal appearing vagina with normal color and discharge, no lesions. CERVIX: normal appearing cervix without lesions, cervical  motion tenderness absent, cervical os closed with out purulent discharge; vaginal discharge - white, Wet prep and DNA probe for chlamydia and GC obtained.   ADNEXA: normal adnexa in size, nontender and no masses UTERUS: uterus is normal size, shape, consistency and nontender.    Musculoskeletal: Normal range of motion.  Neurological: She is alert and oriented to person, place, and time.  Skin: Skin is warm and dry.  Psychiatric: She has a normal mood and affect. Judgment normal.  Nursing note and vitals reviewed.   ED Course  Procedures (including critical care time) DIAGNOSTIC STUDIES: Oxygen Saturation is 99% on ra, nl by my interpretation.    COORDINATION OF CARE 12:53 AM: Discussed treatment plan which includes pelvic exam with pt at bedside; patient verbalizes understanding and agrees with treatment plan. Pt also offered meds for pain and nausea but declined.   Labs Review Labs Reviewed  WET PREP, GENITAL - Abnormal; Notable for the following:    Trich, Wet Prep FEW (*)    WBC, Wet Prep HPF POC MANY (*)    All other components within normal limits  URINALYSIS, ROUTINE W REFLEX MICROSCOPIC (NOT AT [facility]) - Abnormal; Notable for the following:    APPearance CLOUDY (*)    Hgb urine dipstick TRACE (*)    Leukocytes, UA LARGE (*)    All other components within normal limits  COMPREHENSIVE METABOLIC PANEL - Abnormal; Notable for the following:    Total Protein 8.2 (*)    All other components within normal limits  LIPASE, BLOOD  CBC  URINE MICROSCOPIC-ADD ON  RAPID HIV SCREEN (HIV 1/2 AB+AG)  RPR  POC URINE PREG, ED  GC/CHLAMYDIA PROBE AMP ([place]) NOT AT [facility]   I have personally reviewed and evaluated these lab results as part of my medical decision-making.  MDM   Final diagnoses:  None    Patient with trichomoniasis.  Patient be started on Flagyl.  Gynecologic follow-up.  Patient understands return to ER for new or worsening symptoms.  I, Genowefa Morga M,  personally performed the services described in this documentation. All medical record entries made by the scribe were at my direction and in my presence.  I have reviewed the chart and discharge instructions and agree that the record reflects my personal performance and is accurate and complete. Deedra Pro M.  02/27/2015. 2:23 AM.       Azalia BilisKevin Demar Shad, MD 02/27/15 [phone]

## 2015-02-28 LAB — GC/CHLAMYDIA PROBE AMP (~~LOC~~) NOT AT ARMC
Chlamydia: NEGATIVE
Neisseria Gonorrhea: NEGATIVE

## 2015-03-09 ENCOUNTER — Encounter: Payer: Self-pay | Admitting: Allergy and Immunology

## 2015-03-09 ENCOUNTER — Ambulatory Visit (INDEPENDENT_AMBULATORY_CARE_PROVIDER_SITE_OTHER): Payer: BLUE CROSS/BLUE SHIELD | Admitting: Allergy and Immunology

## 2015-03-09 VITALS — BP 110/66 | HR 64 | Temp 98.7°F | Resp 16 | Ht 60.24 in | Wt 168.7 lb

## 2015-03-09 DIAGNOSIS — J4541 Moderate persistent asthma with (acute) exacerbation: Secondary | ICD-10-CM | POA: Insufficient documentation

## 2015-03-09 DIAGNOSIS — J45901 Unspecified asthma with (acute) exacerbation: Secondary | ICD-10-CM

## 2015-03-09 DIAGNOSIS — L501 Idiopathic urticaria: Secondary | ICD-10-CM | POA: Diagnosis not present

## 2015-03-09 DIAGNOSIS — J3089 Other allergic rhinitis: Secondary | ICD-10-CM | POA: Diagnosis not present

## 2015-03-09 HISTORY — DX: Moderate persistent asthma with (acute) exacerbation: J45.41

## 2015-03-09 MED ORDER — BUDESONIDE-FORMOTEROL FUMARATE 160-4.5 MCG/ACT IN AERO: 2.0000 | INHALATION_SPRAY | Freq: Two times a day (BID) | RESPIRATORY_TRACT | Status: DC

## 2015-03-09 MED ORDER — ALBUTEROL SULFATE HFA 108 (90 BASE) MCG/ACT IN AERS: 2.0000 | INHALATION_SPRAY | RESPIRATORY_TRACT | Status: DC | PRN

## 2015-03-09 MED ORDER — IPRATROPIUM BROMIDE 0.02 % IN SOLN
0.5000 mg | Freq: Once | RESPIRATORY_TRACT | Status: AC
Start: 2015-03-09 — End: 2015-03-09
  Administered 2015-03-09: 0.5 mg via RESPIRATORY_TRACT

## 2015-03-09 MED ORDER — EPINEPHRINE 0.3 MG/0.3ML IJ SOAJ: 0.3000 mg | Freq: Once | INTRAMUSCULAR | Status: DC

## 2015-03-09 MED ORDER — LEVALBUTEROL HCL 1.25 MG/3ML IN NEBU
1.2500 mg | INHALATION_SOLUTION | Freq: Once | RESPIRATORY_TRACT | Status: AC
  Administered 2015-03-09: 1.25 mg via RESPIRATORY_TRACT

## 2015-03-09 NOTE — Assessment & Plan Note (Signed)
   We were unable to perform skin tests today due to recent administration of antihistamine.   The patient is scheduled to return next week for allergy skin testing after having been off of antihistamines for at least 3 days.  Further recommendations will be made at that time based upon skin test results.

## 2015-03-09 NOTE — Assessment & Plan Note (Addendum)
   Prednisone has been provided, 20 mg x 4 days, 10 mg x1 day, then stop.  A sample and prescription have been provided for Symbicort 160/4.5 g, 2 inhalations twice a day.To maximize pulmonary deposition, a spacer has been provided along with instructions for its proper administration with an HFA inhaler.  Continue albuterol HFA, 1-2 inhalations every 4-6 hours as needed and 15 minutes prior to exercise.  Subjective and objective measures of pulmonary function will be followed and the treatment plan will be adjusted accordingly.

## 2015-03-09 NOTE — Patient Instructions (Addendum)
Asthma with acute exacerbation  Prednisone has been provided, 20 mg x 4 days, 10 mg x1 day, then stop.  A sample and prescription have been provided for Symbicort 160/4.5 g, 2 inhalations twice a day.To maximize pulmonary deposition, a spacer has been provided along with instructions for its proper administration with an HFA inhaler.  Continue albuterol HFA, 1-2 inhalations every 4-6 hours as needed and 15 minutes prior to exercise.  Subjective and objective measures of pulmonary function will be followed and the treatment plan will be adjusted accordingly.  Idiopathic urticaria  We were unable to perform skin tests today due to recent administration of antihistamine.   The patient is scheduled to return next week for allergy skin testing after having been off of antihistamines for at least 3 days.  Further recommendations will be made at that time based upon skin test results.  Allergic rhinitis  The patient is scheduled to return next week for allergy skin testing after having been off of antihistamines for at least 3 days.  Further recommendations will be made at that time based upon skin test results.    Return in about 2 weeks (around 03/23/2015), or On November 8 for skin test. Be off of all antihistamines for at least 3 days prior to the skin test.

## 2015-03-09 NOTE — Assessment & Plan Note (Signed)
   The patient is scheduled to return next week for allergy skin testing after having been off of antihistamines for at least 3 days.  Further recommendations will be made at that time based upon skin test results. 

## 2015-03-09 NOTE — Progress Notes (Signed)
History of present illness: HPI Comments: Danielle Velez is a 24 y.o. female who presents today for her initial evaluation of hives, asthma, and rhinitis.  Over the past year, Danielle Velez has experienced recurrent episodes of hives. Typical distribution includes the chest, back, arms and legs.  The lesions are described as erythematous, raised, and pruritic.  Individual hives last less than 24 hours without leaving residual pigmentation or bruising. The patient does not experience concomitant cardiopulmonary or GI symptoms. The patient has not experienced unexpected weight loss, recurrent fevers or drenching night sweats. No specific medication, food or environmental triggers have been identified. The symptoms do not seem to" correlate with NSAIDs use. Symptoms do not seem to correlate with emotional stress. The patient did not have signs or symptoms of infection at the time of symptom onset. Danielle Velez has tried to control symptoms with the following medications: OTC antihistamines. These medications have offered adequate temporary relief of symptoms. The patient has not required Emergency Room evaluation and treatment for these symptoms. Skin biopsy has not been performed.   Danielle Velez experiences nasal congestion, rhinorrhea, postnasal drainage, and sinus pressure.  These symptoms occur year round but seem more severe in the springtime.  She reports that she has for sinus infection per year on average.  She was diagnosed with asthma as an infant.  Specific triggers for her asthma include exercise, upper respiratory tract infections, cold weather, and weather changes.  She had been on Symbicort 160/4.5 g, 2 inhalations twice a day with adequate control of her asthma, however she discontinued this medication 2 months ago when she moved to [place] from [place].  Currently she has been experiencing increased symptoms and albuterol requirement.  She has asthma symptoms daily and nocturnal awakenings due to  lower respiratory symptoms 2 or 3 times per week.   Assessment and plan: Asthma with acute exacerbation  Prednisone has been provided, 20 mg x 4 days, 10 mg x1 day, then stop.  A sample and prescription have been provided for Symbicort 160/4.5 g, 2 inhalations twice a day.To maximize pulmonary deposition, a spacer has been provided along with instructions for its proper administration with an HFA inhaler.  Continue albuterol HFA, 1-2 inhalations every 4-6 hours as needed and 15 minutes prior to exercise.  Subjective and objective measures of pulmonary function will be followed and the treatment plan will be adjusted accordingly.  Idiopathic urticaria  We were unable to perform skin tests today due to recent administration of antihistamine.   The patient is scheduled to return next week for allergy skin testing after having been off of antihistamines for at least 3 days.  Further recommendations will be made at that time based upon skin test results.  Allergic rhinitis  The patient is scheduled to return next week for allergy skin testing after having been off of antihistamines for at least 3 days.  Further recommendations will be made at that time based upon skin test results.    Medications ordered this encounter: Meds ordered this encounter  Medications  . levalbuterol (XOPENEX) nebulizer solution 1.25 mg    Sig:   . ipratropium (ATROVENT) nebulizer solution 0.5 mg    Sig:     Diagnositics: Spirometry: Obstructive pattern with significant postbronchodilator improvement. Allergy skin testing: We were unable to perform skin tests today due to recent administration of antihistamine.     Physical examination: Blood pressure 110/66, pulse 64, temperature 98.7 F (37.1 C), temperature source Oral, resp. rate 16, height 5' 0.24" (1.53 m), weight  168 lb 10.4 oz (76.5 kg), last menstrual period 02/21/2015.  General: Alert, interactive, in no acute distress. HEENT: TMs pearly  gray, turbinates moderately edematous without discharge, post-pharynx erythematous. Neck: Supple without lymphadenopathy. Lungs: Mildly decreased breath sounds bilaterally without wheezing, rhonchi or rales. CV: Normal S1, S2 without murmurs. Abdomen: Nondistended, nontender. Skin: Warm and dry, without lesions or rashes. Extremities:  No clubbing, cyanosis or edema. Neuro:   Grossly intact.  Review of systems: Review of Systems  Constitutional: Negative for fever, chills and weight loss.  HENT: Negative for nosebleeds.   Eyes: Negative for blurred vision.  Respiratory: Negative for hemoptysis.   Cardiovascular: Negative for chest pain.  Gastrointestinal: Negative for diarrhea and constipation.  Genitourinary: Negative for dysuria.  Musculoskeletal: Negative for myalgias and joint pain.  Neurological: Negative for dizziness.  Endo/Heme/Allergies: Does not bruise/bleed easily.    Past medical history: Past Medical History  Diagnosis Date  . Asthma   . Angio-edema   . Recurrent upper respiratory infection (URI)   . Urticaria     Past surgical history: Past Surgical History  Procedure Laterality Date  . Cesarean section    . Cesarean section  09/16/2010    Family history: Family History  Problem Relation Age of Onset  . Asthma Maternal Grandmother   . Allergic rhinitis Neg Hx   . Angioedema Neg Hx   . Atopy Neg Hx   . Eczema Neg Hx   . Immunodeficiency Neg Hx   . Urticaria Neg Hx     Social history: Social History   Social History  . Marital Status: Single    Spouse Name: N/A  . Number of Children: N/A  . Years of Education: N/A   Occupational History  . Not on file.   Social History Main Topics  . Smoking status: Never Smoker   . Smokeless tobacco: Never Used  . Alcohol Use: No  . Drug Use: No  . Sexual Activity: Not on file   Other Topics Concern  . Not on file   Social History Narrative   Environmental History:  Danielle Velez lives in a  23 year old condominium with carpeting throughout, window air conditioning units, and central heat.  She is a nonsmoker and is not exposed to significant secondhand cigarette smoke.  Known medication allergies: Allergies  Allergen Reactions  . Effexor [Venlafaxine] Other (See Comments)    Shaky   . Other Hives    DAIRY and WHEAT.  REACTION HIVES AND ITCHING.  Marland Kitchen. Shellfish Allergy Itching and Swelling    Outpatient medications:   Medication List       This list is accurate as of: 03/09/15  5:19 PM.  Always use your most recent med list.               albuterol 108 (90 BASE) MCG/ACT inhaler  Commonly known as:  PROVENTIL HFA;VENTOLIN HFA  Inhale 2 puffs into the lungs every 6 (six) hours as needed for wheezing or shortness of breath.     budesonide-formoterol 160-4.5 MCG/ACT inhaler  Commonly known as:  SYMBICORT  Inhale 2 puffs into the lungs 2 (two) times daily. Patient is out of this medication     diphenhydrAMINE 25 MG tablet  Commonly known as:  BENADRYL  Take 50 mg by mouth every 6 (six) hours as needed (for allergic reation).     EPIPEN 2-PAK 0.3 mg/0.3 mL Soaj injection  Generic drug:  EPINEPHrine  Inject 0.3 mg into the muscle once.     ibuprofen 200  MG tablet  Commonly known as:  ADVIL,MOTRIN  Take 400 mg by mouth every 6 (six) hours as needed for moderate pain.     metroNIDAZOLE 500 MG tablet  Commonly known as:  FLAGYL  Take 1 tablet (500 mg total) by mouth 2 (two) times daily.        I appreciate the opportunity to take part in this [facility] care. Please do not hesitate to contact me with questions.  Sincerely,   R. Jorene Guest, MD

## 2015-03-17 ENCOUNTER — Other Ambulatory Visit: Payer: Self-pay

## 2015-03-17 DIAGNOSIS — J45901 Unspecified asthma with (acute) exacerbation: Secondary | ICD-10-CM

## 2015-03-17 DIAGNOSIS — L501 Idiopathic urticaria: Secondary | ICD-10-CM

## 2015-03-17 MED ORDER — EPINEPHRINE 0.3 MG/0.3ML IJ SOAJ: 0.3000 mg | Freq: Once | INTRAMUSCULAR | Status: DC

## 2015-03-17 MED ORDER — BUDESONIDE-FORMOTEROL FUMARATE 160-4.5 MCG/ACT IN AERO: 2.0000 | INHALATION_SPRAY | Freq: Two times a day (BID) | RESPIRATORY_TRACT | Status: DC

## 2015-03-17 MED ORDER — ALBUTEROL SULFATE HFA 108 (90 BASE) MCG/ACT IN AERS: 2.0000 | INHALATION_SPRAY | RESPIRATORY_TRACT | Status: DC | PRN

## 2015-03-17 NOTE — Telephone Encounter (Signed)
PATIENT CAME IN TO LET US KNOW SHE DID NOT RECEIVE HER MEDS TO [facility] ON [address]. PLEASE SEND ALL MEDS TO THIS [facility], NOT THE ONE ON [address]  THANKS

## 2015-03-17 NOTE — Telephone Encounter (Signed)
[facility] [address] and changed pharmacy in epic. Patient notified.

## 2015-03-22 ENCOUNTER — Other Ambulatory Visit: Payer: Self-pay | Admitting: *Deleted

## 2015-03-22 ENCOUNTER — Ambulatory Visit (INDEPENDENT_AMBULATORY_CARE_PROVIDER_SITE_OTHER): Payer: BLUE CROSS/BLUE SHIELD | Admitting: Allergy and Immunology

## 2015-03-22 ENCOUNTER — Encounter: Payer: Self-pay | Admitting: Allergy and Immunology

## 2015-03-22 VITALS — BP 140/90 | HR 80 | Resp 16

## 2015-03-22 DIAGNOSIS — L501 Idiopathic urticaria: Secondary | ICD-10-CM

## 2015-03-22 DIAGNOSIS — T7800XA Anaphylactic reaction due to unspecified food, initial encounter: Secondary | ICD-10-CM

## 2015-03-22 DIAGNOSIS — J3089 Other allergic rhinitis: Secondary | ICD-10-CM | POA: Diagnosis not present

## 2015-03-22 DIAGNOSIS — J4540 Moderate persistent asthma, uncomplicated: Secondary | ICD-10-CM | POA: Diagnosis not present

## 2015-03-22 DIAGNOSIS — K2970 Gastritis, unspecified, without bleeding: Secondary | ICD-10-CM | POA: Diagnosis not present

## 2015-03-22 DIAGNOSIS — J45901 Unspecified asthma with (acute) exacerbation: Secondary | ICD-10-CM

## 2015-03-22 HISTORY — DX: Anaphylactic reaction due to unspecified food, initial encounter: T78.00XA

## 2015-03-22 MED ORDER — LEVOCETIRIZINE DIHYDROCHLORIDE 5 MG PO TABS: 5.0000 mg | ORAL_TABLET | Freq: Every evening | ORAL | Status: DC

## 2015-03-22 MED ORDER — FLUTICASONE PROPIONATE 50 MCG/ACT NA SUSP: 2.0000 | Freq: Every day | NASAL | Status: DC

## 2015-03-22 NOTE — Progress Notes (Signed)
History of present illness: HPI Comments: Danielle Velez is a 24 y.o. female with asthma, chronic urticaria, and rhinitis who presents today for follow up and allergy skin testing.  She was unable to undergo skin testing during her initial evaluation 2 weeks ago due to asthma exacerbation and recent administration of antihistamine.  She reports that with the prednisone burst/taper and Symbicort that her asthma symptoms improved significantly and have been well-controlled over the past 10 days.  She reports that she still experiences hives daily.  Despite this, she discontinued antihistamines 3 days ago in anticipation of today's skin tests.  She continues to experience nasal congestion, rhinorrhea, postnasal drainage, and occasional sinus pressure.  She reports that she avoids shellfish because in the past she has experienced dyspnea and the sensation of her throat closing with the consumption of shellfish.  She has access to an epinephrine autoinjector 2 pack.   Assessment and plan: Idiopathic urticaria Chronic urticaria. There is no obvious etiology identified. Skin tests to select food allergens were negative today, with the exception of shellfish which she avoids. NSAIDs and emotional stress commonly exacerbate urticaria but are not the underlying etiology in this case. Physical urticarias are negative by history (i.e. pressure-induced, temperature, vibration, solar, etc.). There are no concomitant symptoms concerning for anaphylaxis or constitutional symptoms worrisome for an underlying malignancy. We will rule out other potential etiologies with labs. For symptom relief, patient is to take oral antihistamines as directed.  The following labs have been ordered: FCeRI antibody, TSH, anti-thyroglobulin antibody, thyroid peroxidase antibody, tryptase, urea breath test, ESR, ANA, and galactose-alpha-1,3-galactose IgE level.  The patient will be called with further recommendations after lab results have  returned.  Instructions have been provided and discussed for H1/H2 receptor blockade with titration to find lowest effective dose.  A prescription has been provided for levocetirizine, 5 mg daily as needed.  A journal is to be kept recording any foods eaten, beverages consumed, medications taken within a 6 hour period prior to the onset of symptoms, as well as record activities being performed, and environmental conditions. For any symptoms concerning for anaphylaxis, epinephrine is to be administered and 911 is to be called immediately.  Food allergy Food allergy. The patient's history suggests shellfish allergy and positive skin test results today confirm this diagnosis.  Meticulous avoidance of shellfish as discussed.  Continue to have access to epinephrine autoinjector 2 pack.  A food allergy action plan has been provided and discussed.  Medic Alert identification is recommended.   Allergic rhinitis Perennial and seasonal allergic rhinoconjunctivitis.  Aeroallergen avoidance measures have been discussed and provided in written form.  A prescription has been provided for fluticasone nasal spray, 2 sprays per nostril daily as needed. Proper nasal spray technique has been discussed and demonstrated.  A prescription has been provided for levocetirizine 5 mg daily as needed.  If allergen avoidance measures and medications fail to adequately relieve symptoms, we will consider immunotherapy.   Moderate persistent asthma Improved and controlled with current regimen.  For now, continue Symbicort 160/4.5, 2 inhalations via spacer device twice a day, and albuterol HFA, 1-2 inhalations every 4-6 hours as needed.  Subjective and objective measures of pulmonary function will be followed and the treatment plan will be adjusted accordingly.    Medications ordered this encounter: Meds ordered this encounter  Medications  . levocetirizine (XYZAL) 5 MG tablet    Sig: Take 1 tablet (5 mg  total) by mouth every evening.    Dispense:  30 tablet  Refill:  5  . fluticasone (FLONASE) 50 MCG/ACT nasal spray    Sig: Place 2 sprays into both nostrils daily.    Dispense:  16 g    Refill:  5    Diagnositics: Food allergen skin tests: Positive to shellfish mix and shrimp. Environmental skin tests: Aeroallergen skin tests are positive to grass pollen, weed pollen, ragweed pollen, tree pollen, mold, cat hair, dog epithelia, dust mite, cockroach antigen. Spirometry: FVC is 2.60 L (96% predicted) and FEV1 is 2.36 L (98% predicted).    Physical examination: Blood pressure 140/90, pulse 80, resp. rate 16, last menstrual period 02/21/2015.  General: Alert, interactive, in no acute distress. HEENT: TMs pearly gray, turbinates moderately edematous with clear discharge, post-pharynx moderately erythematous. Neck: Supple without lymphadenopathy. Lungs: Clear to auscultation without wheezing, rhonchi or rales. CV: Normal S1, S2 without murmurs. Skin: Small, scattered urticarial eruptions on the neck and along the hairline of the scalp.  The following portions of the patient's history were reviewed and updated as appropriate: allergies, current medications, past family history, past medical history, past social history, past surgical history and problem list.  Review of systems: Constitutional: Negative for fever, chills and weight loss.  HENT: Negative for nosebleeds.   Eyes: Negative for blurred vision.  Respiratory: Negative for hemoptysis.   Cardiovascular: Negative for chest pain.  Gastrointestinal: Negative for diarrhea and constipation.  Genitourinary: Negative for dysuria.  Musculoskeletal: Negative for myalgias and joint pain.  Neurological: Negative for dizziness.  Endo/Heme/Allergies: Does not bruise/bleed easily.   Outpatient medications:   Medication List       This list is accurate as of: 03/22/15  5:53 PM.  Always use your most recent med list.                albuterol 108 (90 BASE) MCG/ACT inhaler  Commonly known as:  PROVENTIL HFA;VENTOLIN HFA  Inhale 2 puffs into the lungs every 4 (four) hours as needed for wheezing or shortness of breath (cough).     budesonide-formoterol 160-4.5 MCG/ACT inhaler  Commonly known as:  SYMBICORT  Inhale 2 puffs into the lungs 2 (two) times daily. Patient is out of this medication     diphenhydrAMINE 25 MG tablet  Commonly known as:  BENADRYL  Take 50 mg by mouth every 6 (six) hours as needed (for allergic reation).     EPINEPHrine 0.3 mg/0.3 mL Soaj injection  Commonly known as:  EPIPEN 2-PAK  Inject 0.3 mLs (0.3 mg total) into the muscle once.     fluticasone 50 MCG/ACT nasal spray  Commonly known as:  FLONASE  Place 2 sprays into both nostrils daily.     ibuprofen 200 MG tablet  Commonly known as:  ADVIL,MOTRIN  Take 400 mg by mouth every 6 (six) hours as needed for moderate pain.     levocetirizine 5 MG tablet  Commonly known as:  XYZAL  Take 1 tablet (5 mg total) by mouth every evening.     metroNIDAZOLE 500 MG tablet  Commonly known as:  FLAGYL  Take 1 tablet (500 mg total) by mouth 2 (two) times daily.        Known medication allergies: Allergies  Allergen Reactions  . Effexor [Venlafaxine] Other (See Comments)    Shaky   . Other Hives    DAIRY and WHEAT.  REACTION HIVES AND ITCHING.  Marland Kitchen Shellfish Allergy Itching and Swelling    I appreciate the opportunity to take part in this [facility] care. Please do not hesitate to  contact me with questions.  Sincerely,   R. Edgar Frisk, MD

## 2015-03-22 NOTE — Assessment & Plan Note (Signed)
Food allergy. The patient's history suggests shellfish allergy and positive skin test results today confirm this diagnosis.  Meticulous avoidance of shellfish as discussed.  Continue to have access to epinephrine autoinjector 2 pack.  A food allergy action plan has been provided and discussed.  Medic Alert identification is recommended.

## 2015-03-22 NOTE — Assessment & Plan Note (Addendum)
Perennial and seasonal allergic rhinoconjunctivitis.  Aeroallergen avoidance measures have been discussed and provided in written form.  A prescription has been provided for fluticasone nasal spray, 2 sprays per nostril daily as needed. Proper nasal spray technique has been discussed and demonstrated.  A prescription has been provided for levocetirizine 5 mg daily as needed.  If allergen avoidance measures and medications fail to adequately relieve symptoms, we will consider immunotherapy.

## 2015-03-22 NOTE — Assessment & Plan Note (Addendum)
Improved and controlled with current regimen.  For now, continue Symbicort 160/4.5, 2 inhalations via spacer device twice a day, and albuterol HFA, 1-2 inhalations every 4-6 hours as needed.  Subjective and objective measures of pulmonary function will be followed and the treatment plan will be adjusted accordingly.

## 2015-03-22 NOTE — Patient Instructions (Addendum)
Idiopathic urticaria Chronic urticaria. There is no obvious etiology identified. Skin tests to select food allergens were negative today, with the exception of shellfish which she avoids. NSAIDs and emotional stress commonly exacerbate urticaria but are not the underlying etiology in this case. Physical urticarias are negative by history (i.e. pressure-induced, temperature, vibration, solar, etc.). There are no concomitant symptoms concerning for anaphylaxis or constitutional symptoms worrisome for an underlying malignancy. We will rule out other potential etiologies with labs. For symptom relief, patient is to take oral antihistamines as directed.  The following labs have been ordered: FCeRI antibody, TSH, anti-thyroglobulin antibody, thyroid peroxidase antibody, tryptase, urea breath test, ESR, ANA, and galactose-alpha-1,3-galactose IgE level.  The patient will be called with further recommendations after lab results have returned.  Instructions have been provided and discussed for H1/H2 receptor blockade with titration to find lowest effective dose.  A prescription has been provided for levocetirizine, 5 mg daily as needed.  A journal is to be kept recording any foods eaten, beverages consumed, medications taken within a 6 hour period prior to the onset of symptoms, as well as record activities being performed, and environmental conditions. For any symptoms concerning for anaphylaxis, epinephrine is to be administered and 911 is to be called immediately.  Food allergy Food allergy. The patient's history suggests shellfish allergy and positive skin test results today confirm this diagnosis.  Meticulous avoidance of shellfish as discussed.  Continue to have access to epinephrine autoinjector 2 pack.  A food allergy action plan has been provided and discussed.  Medic Alert identification is recommended.   Allergic rhinitis Perennial and seasonal allergic rhinoconjunctivitis.  Aeroallergen  avoidance measures have been discussed and provided in written form.  A prescription has been provided for fluticasone nasal spray, 2 sprays per nostril daily as needed. Proper nasal spray technique has been discussed and demonstrated.  A prescription has been provided for levocetirizine 5 mg daily as needed.  If allergen avoidance measures and medications fail to adequately relieve symptoms, we will consider immunotherapy.   Moderate persistent asthma Improved and controlled with current regimen.  For now, continue Symbicort 160/4.5, 2 inhalations via spacer device twice a day, and albuterol HFA, 1-2 inhalations every 4-6 hours as needed.  Subjective and objective measures of pulmonary function will be followed and the treatment plan will be adjusted accordingly.    Return in about 4 weeks (around 04/19/2015), or if symptoms worsen or fail to improve.    Urticaria (Hives)  . Levocetirizine (Xyzal) 5 mg in morning and Cetirizine (Zyrtec) 92m at night and ranitidine (Zantac) 150 mg twice a day. If no symptoms for 7-14 days then decrease to. . Levocetirizine (Xyzal) 5 mg in morning and Cetirizine (Zyrtec) 185mat night and ranitidine (Zantac) 150 mg once a day.  If no symptoms for 7-14 days then decrease to. . Levocetirizine (Xyzal) 5 mg in morning and Cetirizine (Zyrtec) 1046mt night.  If no symptoms for 7-14 days then decrease to. . Levocetirizine (Xyzal) 5 mg once a day.  May use Benadryl (diphenhydramine) as needed for breakthrough symptoms       If symptoms return, then step up dosage  Reducing Pollen Exposure  The American Academy of Allergy, Asthma and Immunology suggests the following steps to reduce your exposure to pollen during allergy seasons.    1. Do not hang sheets or clothing out to dry; pollen may collect on these items. 2. Do not mow lawns or spend time around freshly cut grass; mowing stirs up pollen.  3. Keep windows closed at night.  Keep car windows closed  while driving. 4. Minimize morning activities outdoors, a time when pollen counts are usually at their highest. 5. Stay indoors as much as possible when pollen counts or humidity is high and on windy days when pollen tends to remain in the air longer. 6. Use air conditioning when possible.  Many air conditioners have filters that trap the pollen spores. 7. Use a HEPA room air filter to remove pollen form the indoor air you breathe.   Control of House Dust Mite Allergen  House dust mites play a major role in allergic asthma and rhinitis.  They occur in environments with high humidity wherever human skin, the food for dust mites is found. High levels have been detected in dust obtained from mattresses, pillows, carpets, upholstered furniture, bed covers, clothes and soft toys.  The principal allergen of the house dust mite is found in its feces.  A gram of dust may contain 1,000 mites and 250,000 fecal particles.  Mite antigen is easily measured in the air during house cleaning activities.    1. Encase mattresses, including the box spring, and pillow, in an air tight cover.  Seal the zipper end of the encased mattresses with wide adhesive tape. 2. Wash the bedding in water of 130 degrees Farenheit weekly.  Avoid cotton comforters/quilts and flannel bedding: the most ideal bed covering is the dacron comforter. 3. Remove all upholstered furniture from the bedroom. 4. Remove carpets, carpet padding, rugs, and non-washable window drapes from the bedroom.  Wash drapes weekly or use plastic window coverings. 5. Remove all non-washable stuffed toys from the bedroom.  Wash stuffed toys weekly. 6. Have the room cleaned frequently with a vacuum cleaner and a damp dust-mop.  The patient should not be in a room which is being cleaned and should wait 1 hour after cleaning before going into the room. 7. Close and seal all heating outlets in the bedroom.  Otherwise, the room will become filled with dust-laden air.   An electric heater can be used to heat the room. 8. Reduce indoor humidity to less than 50%.  Do not use a humidifier.  Control of Dog or Cat Allergen  Avoidance is the best way to manage a dog or cat allergy. If you have a dog or cat and are allergic to dog or cats, consider removing the dog or cat from the home. If you have a dog or cat but don't want to find it a new home, or if your family wants a pet even though someone in the household is allergic, here are some strategies that may help keep symptoms at bay:  1. Keep the pet out of your bedroom and restrict it to only a few rooms. Be advised that keeping the dog or cat in only one room will not limit the allergens to that room. 2. Don't pet, hug or kiss the dog or cat; if you do, wash your hands with soap and water. 3. High-efficiency particulate air (HEPA) cleaners run continuously in a bedroom or living room can reduce allergen levels over time. 4. Regular use of a high-efficiency vacuum cleaner or a central vacuum can reduce allergen levels. 5. Giving your dog or cat a bath at least once a week can reduce airborne allergen.  Control of Mold Allergen  Mold and fungi can grow on a variety of surfaces provided certain temperature and moisture conditions exist.  Outdoor molds grow on plants, decaying vegetation  and soil.  The major outdoor mold, Alternaria dn Cladosporium, are found in very high numbers during hot and dry conditions.  Generally, a late Summer - Fall peak is seen for common outdoor fungal spores.  Rain will temporarily lower outdoor mold spore count, but counts rise rapidly when the rainy period ends.  The most important indoor molds are Aspergillus and Penicillium.  Dark, humid and poorly ventilated basements are ideal sites for mold growth.  The next most common sites of mold growth are the bathroom and the kitchen.  Outdoor [employer] 3. Use air conditioning and keep windows closed 4. Avoid exposure to decaying  vegetation. 5. Avoid leaf raking. 6. Avoid grain handling. 7. Consider wearing a face mask if working in moldy areas.  Indoor Mold Control 1. Maintain humidity below 50%. 2. Clean washable surfaces with 5% bleach solution. 3. Remove sources e.g. Contaminated carpets.  Control of Cockroach Allergen  Cockroach allergen has been identified as an important cause of acute attacks of asthma, especially in urban settings.  There are fifty-five species of cockroach that exist in the [place], however only three, the [place], [place] species produce allergen that can affect patients with Asthma.  Allergens can be obtained from fecal particles, egg casings and secretions from cockroaches.    1. Remove food sources. 2. Reduce access to water. 3. Seal access and entry points. 4. Spray runways with 0.5-1% Diazinon or Chlorpyrifos 5. Blow boric acid power under stoves and refrigerator. 6. Place bait stations (hydramethylnon) at feeding sites.

## 2015-03-22 NOTE — Assessment & Plan Note (Addendum)
Chronic urticaria. There is no obvious etiology identified. Skin tests to select food allergens were negative today, with the exception of shellfish which she avoids. NSAIDs and emotional stress commonly exacerbate urticaria but are not the underlying etiology in this case. Physical urticarias are negative by history (i.e. pressure-induced, temperature, vibration, solar, etc.). There are no concomitant symptoms concerning for anaphylaxis or constitutional symptoms worrisome for an underlying malignancy. We will rule out other potential etiologies with labs. For symptom relief, patient is to take oral antihistamines as directed.  The following labs have been ordered: FCeRI antibody, TSH, anti-thyroglobulin antibody, thyroid peroxidase antibody, tryptase, urea breath test, ESR, ANA, and galactose-alpha-1,3-galactose IgE level.  The patient will be called with further recommendations after lab results have returned.  Instructions have been provided and discussed for H1/H2 receptor blockade with titration to find lowest effective dose.  A prescription has been provided for levocetirizine, 5 mg daily as needed.  A journal is to be kept recording any foods eaten, beverages consumed, medications taken within a 6 hour period prior to the onset of symptoms, as well as record activities being performed, and environmental conditions. For any symptoms concerning for anaphylaxis, epinephrine is to be administered and 911 is to be called immediately.

## 2015-04-12 ENCOUNTER — Ambulatory Visit: Payer: BLUE CROSS/BLUE SHIELD | Admitting: Allergy and Immunology

## 2015-04-19 ENCOUNTER — Ambulatory Visit: Payer: BLUE CROSS/BLUE SHIELD | Admitting: Allergy and Immunology

## 2015-04-26 ENCOUNTER — Ambulatory Visit: Payer: BLUE CROSS/BLUE SHIELD | Admitting: Allergy and Immunology

## 2015-07-28 ENCOUNTER — Emergency Department (HOSPITAL_COMMUNITY)
Admission: EM | Admit: 2015-07-28 | Discharge: 2015-07-28 | Disposition: A | Discharge status: Home / Self Care | Payer: Managed Care, Other (non HMO) | Source: Home / Self Care | Attending: Family Medicine | Admitting: Family Medicine

## 2015-07-28 ENCOUNTER — Other Ambulatory Visit (HOSPITAL_COMMUNITY)
Admission: RE | Admit: 2015-07-28 | Discharge: 2015-07-28 | Disposition: A | Discharge status: Home / Self Care | Payer: Managed Care, Other (non HMO) | Source: Ambulatory Visit | Attending: Family Medicine | Admitting: Family Medicine

## 2015-07-28 ENCOUNTER — Encounter (HOSPITAL_COMMUNITY): Payer: Self-pay

## 2015-07-28 DIAGNOSIS — J45901 Unspecified asthma with (acute) exacerbation: Secondary | ICD-10-CM

## 2015-07-28 DIAGNOSIS — J4521 Mild intermittent asthma with (acute) exacerbation: Secondary | ICD-10-CM | POA: Diagnosis not present

## 2015-07-28 LAB — POCT RAPID STREP A: Streptococcus, Group A Screen (Direct): NEGATIVE

## 2015-07-28 MED ORDER — ALBUTEROL SULFATE HFA 108 (90 BASE) MCG/ACT IN AERS: 2.0000 | INHALATION_SPRAY | RESPIRATORY_TRACT | Status: DC | PRN

## 2015-07-28 MED ORDER — PREDNISONE 10 MG PO TABS: 50.0000 mg | ORAL_TABLET | Freq: Every day | ORAL | Status: DC

## 2015-07-28 MED ORDER — HYDROCODONE-HOMATROPINE 5-1.5 MG/5ML PO SYRP: 5.0000 mL | ORAL_SOLUTION | Freq: Four times a day (QID) | ORAL | Status: DC | PRN

## 2015-07-28 NOTE — Discharge Instructions (Signed)
It is a pleasure to see you today.    For the cough related to an asthma exacerbation, I am prescribing you PREDNISONE 10mg  tablets, take 5 tablets by mouth daily for a total of seven days.   Albuterol inhaler 2 puffs every 4 hours as needed for shortness of breath.   Hycodan cough syrup, 1 teaspoon by mouth every six hours as needed for cough.   I recommend you establish care with a primary care practice in the area.  I am giving you the contact information for one such practice.

## 2015-07-28 NOTE — ED Provider Notes (Signed)
CSN: [id number]     Arrival date & time 07/28/15  1840 History   First MD Initiated Contact with Patient 07/28/15 2052     Chief Complaint  Patient presents with  . Cough   (Consider location/radiation/quality/duration/timing/severity/associated sxs/prior Treatment) Patient is a 25 y.o. female presenting with cough. The history is provided by the patient. No language interpreter was used.  Cough Patient presents with 2 days of hoarseness, scratchy sore throat, and dry cough that is reminscent of her asthma.  She recently had a flu like illness 2 weeks ago, seemed to improve until a few days ago.  Has not had rhinorrhea with this illness. Some shortness of breath with exertion, pain along diaphragm and chest wall with coughing. Son age 854 in day care, is sick with cold.   Social Hx moved here recently from [place].  No contact with cigarette smoke. No established PCP in the area.   Past Medical History  Diagnosis Date  . Asthma   . Angio-edema   . Recurrent upper respiratory infection (URI)   . Urticaria    Past Surgical History  Procedure Laterality Date  . Cesarean section    . Cesarean section  09/16/2010   Family History  Problem Relation Age of Onset  . Asthma Maternal Grandmother   . Allergic rhinitis Neg Hx   . Angioedema Neg Hx   . Atopy Neg Hx   . Eczema Neg Hx   . Immunodeficiency Neg Hx   . Urticaria Neg Hx    Social History  Substance Use Topics  . Smoking status: Never Smoker   . Smokeless tobacco: Never Used  . Alcohol Use: No   OB History    No data available     Review of Systems  Respiratory: Positive for cough.     Allergies  Effexor; Other; and Shellfish allergy  Home Medications   Prior to Admission medications   Medication Sig Start Date End Date Taking? Authorizing Provider  albuterol (PROVENTIL HFA;VENTOLIN HFA) 108 (90 Base) MCG/ACT inhaler Inhale 2 puffs into the lungs every 4 (four) hours as needed for wheezing or shortness of breath  (cough). 07/28/15   Barbaraann BarthelJames O Drayson Dorko, MD  budesonide-formoterol [facility](SYMBICORT) 160-4.5 MCG/ACT inhaler Inhale 2 puffs into the lungs 2 (two) times daily. Patient is out of this medication 03/17/15   Cristal Fordalph Carter Bobbitt, MD  diphenhydrAMINE (BENADRYL) 25 MG tablet Take 50 mg by mouth every 6 (six) hours as needed (for allergic reation).    Historical Provider, MD  EPINEPHrine (EPIPEN 2-PAK) 0.3 mg/0.3 mL IJ SOAJ injection Inject 0.3 mLs (0.3 mg total) into the muscle once. 03/17/15   Cristal Fordalph Carter Bobbitt, MD  fluticasone (FLONASE) 50 MCG/ACT nasal spray Place 2 sprays into both nostrils daily. 03/22/15   Cristal Fordalph Carter Bobbitt, MD  HYDROcodone-homatropine [facility](HYCODAN) 5-1.5 MG/5ML syrup Take 5 mLs by mouth every 6 (six) hours as needed for cough. 07/28/15   Barbaraann BarthelJames O Yanisa Goodgame, MD  ibuprofen (ADVIL,MOTRIN) 200 MG tablet Take 400 mg by mouth every 6 (six) hours as needed for moderate pain.    Historical Provider, MD  levocetirizine (XYZAL) 5 MG tablet Take 1 tablet (5 mg total) by mouth every evening. 03/22/15   Cristal Fordalph Carter Bobbitt, MD  metroNIDAZOLE (FLAGYL) 500 MG tablet Take 1 tablet (500 mg total) by mouth 2 (two) times daily. Patient not taking: Reported on 03/09/2015 02/27/15   Azalia BilisKevin Campos, MD  predniSONE (DELTASONE) 10 MG tablet Take 5 tablets (50 mg total) by mouth daily with breakfast.  07/28/15   Barbaraann Barthel, MD   Meds Ordered and Administered this Visit  Medications - No data to display  BP 125/76 mmHg  Pulse 64  Temp(Src) 98.7 F (37.1 C) (Oral)  Resp 16  SpO2 98% No data found.   Physical Exam  Constitutional: She appears well-developed and well-nourished. No distress.  HENT:  Head: Normocephalic and atraumatic.  Right Ear: External ear normal.  Left Ear: External ear normal.  Nose: Nose normal.  Mouth/Throat: Oropharynx is clear and moist. No oropharyngeal exudate.  Eyes: Conjunctivae and EOM are normal. Pupils are equal, round, and reactive to light. Right eye exhibits no discharge. Left eye  exhibits no discharge.  Neck: Normal range of motion. Neck supple. No tracheal deviation present. No thyromegaly present.  Cardiovascular: Normal rate, regular rhythm and normal heart sounds.   No murmur heard. Pulmonary/Chest: Effort normal. No stridor. No respiratory distress. She has no wheezes. She has no rales. She exhibits no tenderness.  Speaking fluidly in no apparent distress. Occasionally takes deep breath, speaking in full sentences with no increased work of breathing.   Good air movement on exam; no wheezes or rales audible.   Abdominal: Soft.  Lymphadenopathy:    She has cervical adenopathy.  Skin: She is not diaphoretic.    ED Course  Procedures (including critical care time)  Labs Review Labs Reviewed  POCT RAPID STREP A    Imaging Review No results found.   Visual Acuity Review  Right Eye Distance:   Left Eye Distance:   Bilateral Distance:    Right Eye Near:   Left Eye Near:    Bilateral Near:         MDM   1. Asthma with acute exacerbation, mild intermittent   2. Asthma with acute exacerbation, unspecified asthma severity    Patient with cough and associated chest wall pain; moving air well on exam, no wheezes.  POx 98% on room air.  Prednisone /day burst for 5 days. Last time she had this was nearly 1 year ago, not hospitalized for asthma since childhood.  Albuterol HFA 2 puffs q4 hrs as needed Hycodan cough syrup for cough suppression, to sleep.  Tylenol and motrin as needed for soreness.  To establish PCP.   Paula Compton, MD   Barbaraann Barthel, MD 07/28/15 2111

## 2015-07-28 NOTE — ED Notes (Signed)
Patient complains of cough congestion sore throat and some tightness in her chest Patient does have asthma and used her inhaler about two hours ago

## 2015-07-31 LAB — CULTURE, GROUP A STREP (THRC)

## 2015-09-02 ENCOUNTER — Other Ambulatory Visit: Payer: Self-pay | Admitting: Nurse Practitioner

## 2015-09-02 DIAGNOSIS — N6452 Nipple discharge: Secondary | ICD-10-CM

## 2015-09-15 ENCOUNTER — Other Ambulatory Visit: Payer: Managed Care, Other (non HMO)

## 2015-09-19 ENCOUNTER — Ambulatory Visit
Admission: RE | Admit: 2015-09-19 | Discharge: 2015-09-19 | Disposition: A | Discharge status: Home / Self Care | Payer: Managed Care, Other (non HMO) | Source: Ambulatory Visit | Attending: Nurse Practitioner | Admitting: Nurse Practitioner

## 2015-09-19 DIAGNOSIS — N6452 Nipple discharge: Secondary | ICD-10-CM

## 2015-09-22 ENCOUNTER — Other Ambulatory Visit: Payer: Self-pay

## 2015-09-22 ENCOUNTER — Telehealth: Payer: Self-pay

## 2015-09-22 DIAGNOSIS — J45901 Unspecified asthma with (acute) exacerbation: Secondary | ICD-10-CM

## 2015-09-22 MED ORDER — BUDESONIDE-FORMOTEROL FUMARATE 160-4.5 MCG/ACT IN AERO: 2.0000 | INHALATION_SPRAY | Freq: Two times a day (BID) | RESPIRATORY_TRACT | Status: DC

## 2015-09-22 NOTE — Telephone Encounter (Signed)
Patient  Stated she received a sample of symbicort and would like  It sent in as a prescription to pick up from [facility] [address]  Please Advise

## 2015-09-22 NOTE — Telephone Encounter (Signed)
Sent in symbicort with 5 refills

## 2015-10-14 ENCOUNTER — Emergency Department (HOSPITAL_COMMUNITY)
Admission: EM | Admit: 2015-10-14 | Discharge: 2015-10-15 | Disposition: A | Discharge status: Home / Self Care | Payer: Managed Care, Other (non HMO) | Attending: Emergency Medicine | Admitting: Emergency Medicine

## 2015-10-14 ENCOUNTER — Encounter (HOSPITAL_COMMUNITY): Payer: Self-pay | Admitting: Oncology

## 2015-10-14 DIAGNOSIS — X58XXXA Exposure to other specified factors, initial encounter: Secondary | ICD-10-CM | POA: Insufficient documentation

## 2015-10-14 DIAGNOSIS — Z791 Long term (current) use of non-steroidal anti-inflammatories (NSAID): Secondary | ICD-10-CM | POA: Diagnosis not present

## 2015-10-14 DIAGNOSIS — Y939 Activity, unspecified: Secondary | ICD-10-CM | POA: Insufficient documentation

## 2015-10-14 DIAGNOSIS — Y999 Unspecified external cause status: Secondary | ICD-10-CM | POA: Insufficient documentation

## 2015-10-14 DIAGNOSIS — Y929 Unspecified place or not applicable: Secondary | ICD-10-CM | POA: Diagnosis not present

## 2015-10-14 DIAGNOSIS — T192XXA Foreign body in vulva and vagina, initial encounter: Secondary | ICD-10-CM | POA: Insufficient documentation

## 2015-10-14 DIAGNOSIS — Z79899 Other long term (current) drug therapy: Secondary | ICD-10-CM | POA: Diagnosis not present

## 2015-10-14 DIAGNOSIS — J45909 Unspecified asthma, uncomplicated: Secondary | ICD-10-CM | POA: Insufficient documentation

## 2015-10-14 NOTE — ED Provider Notes (Signed)
CSN: [id number]     Arrival date & time 10/14/15  2043 History  By signing my name below, I, Ronney Lion, attest that this documentation has been prepared under the direction and in the presence of [employer], PA-C. Electronically Signed: Ronney Lion, ED Scribe. 10/14/2015. 12:19 AM.    Chief Complaint  Patient presents with  . Foreign Body in Vagina   The history is provided by the patient. No language interpreter was used.   HPI Comments: Danielle Velez is a 25 y.o. female who presents to the Emergency Department complaining of gradual-onset, constant, gradually worsening, burning vaginal pain and swelling due to a foreign body in her vagina that began about 10 hours ago, after inserting an herbal vaginal detox product into her vagina about 16 hours ago, at 8:30 AM. She also reports associated, watery, slightly brown, vaginal discharge. Patient states she had ordered a product online called Yoni Goddess Vaginal Detox pearls. Per product package that she brought in with her, she was supposed to insert only one pearl into her vagina for 72 hours, but she reports she had inserted three pearls at once, following instructions that she had read on the internet. She states she had attempted to remove the product after her pain developed, but was unable to, as her vagina was swollen. She reports no symptoms prior to today. She denies nausea, vomiting, or fever.   Past Medical History  Diagnosis Date  . Asthma   . Angio-edema   . Recurrent upper respiratory infection (URI)   . Urticaria    Past Surgical History  Procedure Laterality Date  . Cesarean section    . Cesarean section  09/16/2010   Family History  Problem Relation Age of Onset  . Asthma Maternal Grandmother   . Allergic rhinitis Neg Hx   . Angioedema Neg Hx   . Atopy Neg Hx   . Eczema Neg Hx   . Immunodeficiency Neg Hx   . Urticaria Neg Hx    Social History  Substance Use Topics  . Smoking status: Never Smoker   . Smokeless  tobacco: Never Used  . Alcohol Use: No   OB History    No data available      Review of Systems  Constitutional: Negative for fever.  Gastrointestinal: Negative for nausea and vomiting.  Genitourinary: Positive for vaginal discharge and vaginal pain.  All other systems reviewed and are negative.   Allergies  Effexor; Other; and Shellfish allergy  Home Medications   Prior to Admission medications   Medication Sig Start Date End Date Taking? Authorizing Provider  albuterol (PROVENTIL HFA;VENTOLIN HFA) 108 (90 Base) MCG/ACT inhaler Inhale 2 puffs into the lungs every 4 (four) hours as needed for wheezing or shortness of breath (cough). 07/28/15  Yes Barbaraann Barthel, MD  budesonide-formoterol [facility]) 160-4.5 MCG/ACT inhaler Inhale 2 puffs into the lungs 2 (two) times daily. Patient is out of this medication 09/22/15  Yes Cristal Ford, MD  EPINEPHrine (EPIPEN 2-PAK) 0.3 mg/0.3 mL IJ SOAJ injection Inject 0.3 mLs (0.3 mg total) into the muscle once. 03/17/15  Yes Cristal Ford, MD  ibuprofen (ADVIL,MOTRIN) 200 MG tablet Take 400 mg by mouth every 6 (six) hours as needed for moderate pain.   Yes Historical Provider, MD  fluticasone (FLONASE) 50 MCG/ACT nasal spray Place 2 sprays into both nostrils daily. Patient not taking: Reported on 10/14/2015 03/22/15   Cristal Ford, MD  HYDROcodone-homatropine [facility]) 5-1.5 MG/5ML syrup Take 5 mLs by mouth every  6 (six) hours as needed for cough. Patient not taking: Reported on 10/14/2015 07/28/15   Barbaraann BarthelJames O Breen, MD  levocetirizine (XYZAL) 5 MG tablet Take 1 tablet (5 mg total) by mouth every evening. Patient not taking: Reported on 10/14/2015 03/22/15   Cristal Fordalph Carter Bobbitt, MD  predniSONE (DELTASONE) 20 MG tablet Take 2 tablets (40 mg total) by mouth daily. 10/15/15   Antony MaduraKelly Savina Olshefski, PA-C   BP 128/70 mmHg  Pulse 66  Temp(Src) 98.8 F (37.1 C) (Oral)  Resp 16   Physical Exam  Constitutional: She is oriented to person, place, and  time. She appears well-developed and well-nourished. No distress.  HENT:  Head: Normocephalic and atraumatic.  Eyes: Conjunctivae and EOM are normal. No scleral icterus.  Neck: Normal range of motion.  Cardiovascular: Normal rate, regular rhythm and intact distal pulses.   Pulmonary/Chest: Effort normal. No respiratory distress.  Respirations even and unlabored  Abdominal: Soft. She exhibits no distension. There is no tenderness. There is no rebound.  Soft, nontender, nondistended.  Genitourinary: There is no rash, tenderness, lesion or injury on the right labia. There is no rash, tenderness, lesion or injury on the left labia. There is tenderness in the vagina. No bleeding in the vagina. There is a foreign body in the vagina. Vaginal discharge (scant white/clear) found.  Significant vaginal wall edema. Able to palpate foreign bodies in the vaginal canal. Unable to remove manually. Speculum able to be carefully inserted in order to visualize foreign bodies. Foreign bodies removed with ring forceps. No bleeding noted.  Musculoskeletal: Normal range of motion.  Neurological: She is alert and oriented to person, place, and time. She exhibits normal muscle tone. Coordination normal.  Skin: Skin is warm and dry. No rash noted. She is not diaphoretic. No erythema. No pallor.  Psychiatric: She has a normal mood and affect. Her behavior is normal.  Nursing note and vitals reviewed.   ED Course  .Foreign Body Removal Date/Time: 10/15/2015 2:52 AM Performed by: Antony MaduraHUMES, Cannon Arreola Authorized by: Antony MaduraHUMES, Jaeshaun Riva Consent: The procedure was performed in an emergent situation. Verbal consent obtained. Written consent not obtained. Risks and benefits: risks, benefits and alternatives were discussed Consent given by: patient Patient understanding: patient states understanding of the procedure being performed Patient consent: the patient's understanding of the procedure matches consent given Procedure consent:  procedure consent matches procedure scheduled Relevant documents: relevant documents present and verified Test results: test results available and properly labeled Site marked: the operative site was marked Imaging studies: imaging studies available Required items: required blood products, implants, devices, and special equipment available Patient identity confirmed: verbally with patient and arm band Time out: Immediately prior to procedure a "time out" was called to verify the correct patient, procedure, equipment, support staff and site/side marked as required. Body area: vagina Patient sedated: no Patient restrained: no Patient cooperative: yes Localization method: palpated and visualized. Removal mechanism: ring forceps Complexity: complex (made complicated by degree of vaginal edema) 3 objects recovered. Objects recovered: Yoni pearls Post-procedure assessment: foreign body removed Patient tolerance: Patient tolerated the procedure well with no immediate complications   (including critical care time)  COORDINATION OF CARE: 12:08 AM - Discussed treatment plan with pt at bedside which includes removal of pearls from vagina. Pt verbalized understanding and agreed to plan.   MDM   Final diagnoses:  Foreign body in vagina, initial encounter    [age] year old female presents to the emergency department for significant vaginal edema and vaginal itching after inserting 3 Yoni pearls in  her vaginal canal today. After multiple attempts, foreign bodies were finally able to be visualized and removed with ring forceps. Patient tolerated well with no immediate complications. No residual foreign bodies noted. Given degree of vaginal edema, will start patient on a 5 day course of prednisone to try and lessen inflammation. Have advised NSAIDs for pain and OB/GYN follow-up for reevaluation on Monday. Return precautions given at discharge. Patient agreeable to plan with no unaddressed concerns;  discharged in satisfactory condition.  I personally performed the services described in this documentation, which was scribed in my presence. The recorded information has been reviewed and is accurate.       Antony Madura, PA-C 10/15/15 0258  Richardean Canal, MD 10/15/15 [phone]

## 2015-10-14 NOTE — ED Notes (Signed)
Pt used a natural herbal vaginal detox.  Per the directions on the package she inserted product into the vagina and was to leave in place for 72 hours.  Pt c/o swelling and pain to her vagina.  Pt states she has tried to remove product however was unsuccessful d/t how painful it was.

## 2015-10-15 MED ORDER — PREDNISONE 20 MG PO TABS: 40.0000 mg | ORAL_TABLET | Freq: Every day | ORAL | Status: DC

## 2015-10-15 MED ORDER — NAPROXEN 500 MG PO TABS
500.0000 mg | ORAL_TABLET | Freq: Once | ORAL | Status: AC
  Administered 2015-10-15: 500 mg via ORAL
  Filled 2015-10-15: qty 1

## 2015-10-15 NOTE — ED Notes (Signed)
Pelvic ready at bedside. 

## 2015-10-15 NOTE — Discharge Instructions (Signed)
Vaginal Foreign Body °A vaginal foreign body is any object that gets stuck or left inside the vagina. Vaginal foreign bodies left in the vagina for a long time can cause irritation and infection. In most cases, symptoms go away once the vaginal foreign body is found and removed. Rarely, a foreign object can break through the walls of the vagina and cause a serious infection inside the abdomen.  °CAUSES  °The most common vaginal foreign bodies are: °· Tampons. °· Contraceptive devices. °· Toilet tissue left in the vagina. °· Small objects that were placed in the vagina out of curiosity and got stuck. °· A result of sexual abuse.  °SIGNS AND SYMPTOMS °· Light vaginal bleeding. °· Blood-tinged vaginal fluid (discharge). °· Vaginal discharge that smells bad. °· Vaginal itching or burning. °· Redness, swelling, or rash near the opening of the vagina. °· Abdominal pain. °· Fever. °· Burning or frequent urination. °DIAGNOSIS  °Your health care provider may be able to diagnose a vaginal foreign body based on the information you provide, your symptoms, and a physical exam. Your health care provider may also perform the following tests to check for infection: °· A swab of the discharge to check under a microscope for bacteria (culture). °· A urine culture. °· An examination of the vagina with a small, lighted scope (vaginoscopy). °· Imaging tests to get a picture of the inside of your vagina, such as: °¨ Ultrasound. °¨ X-ray. °¨ MRI. °TREATMENT  °In most cases, a vaginal foreign body can be easily removed and the symptoms usually go away very quickly. Other treatment may include:  °· If the vaginal foreign body is not easily removed, medicine may be given to make you go to sleep (general anesthesia) to have the object removed. °· Emergency surgery may be necessary if an infection spreads through the walls of the vagina into the abdomen (acute abdomen). This is rare. °· You may need to take antibiotic medicine if you have a  vaginal or urinary tract infection. °HOME CARE INSTRUCTIONS  °· Take medicines only as directed by your health care provider. °· If you were prescribed an antibiotic medicine, finish it all even if you start to feel better. °· Do not have sex or use tampons until your health care provider says it is okay. °· Do not douche or use vaginal rinses unless your health care provider recommends it. °· Keep all follow-up visits as directed by your health care provider. This is important. °SEEK MEDICAL CARE IF: °· You have abdominal pain or burning pain when urinating. °· You have a fever. °SEEK IMMEDIATE MEDICAL CARE IF: °· You have heavy vaginal bleeding or discharge.   °· You have very bad abdominal pain.   °MAKE SURE YOU: °· Understand these instructions. °· Will watch your condition. °· Will get help right away if you are not doing well or get worse. °  °This information is not intended to replace advice given to you by your health care provider. Make sure you discuss any questions you have with your health care provider. °  °Document Released: 09/14/2013 Document Reviewed: 09/14/2013 °Elsevier Interactive Patient Education ©2016 [employer] Inc[employer] ° °

## 2015-10-29 ENCOUNTER — Encounter (HOSPITAL_COMMUNITY): Payer: Self-pay | Admitting: *Deleted

## 2015-10-29 ENCOUNTER — Ambulatory Visit (HOSPITAL_COMMUNITY)
Admission: EM | Admit: 2015-10-29 | Discharge: 2015-10-29 | Disposition: A | Discharge status: Home / Self Care | Payer: Managed Care, Other (non HMO) | Attending: Family Medicine | Admitting: Family Medicine

## 2015-10-29 DIAGNOSIS — L500 Allergic urticaria: Secondary | ICD-10-CM | POA: Diagnosis not present

## 2015-10-29 DIAGNOSIS — Z91010 Allergy to peanuts: Secondary | ICD-10-CM

## 2015-10-29 MED ORDER — DIPHENHYDRAMINE HCL 50 MG/ML IJ SOLN: 50.0000 mg | Freq: Once | INTRAMUSCULAR | Status: DC

## 2015-10-29 MED ORDER — EPINEPHRINE HCL 1 MG/ML IJ SOLN
0.3000 mg | Freq: Once | INTRAMUSCULAR | Status: AC
  Administered 2015-10-29: 0.3 mg via INTRAMUSCULAR

## 2015-10-29 MED ORDER — EPINEPHRINE HCL 1 MG/ML IJ SOLN
INTRAMUSCULAR | Status: AC
  Filled 2015-10-29: qty 1

## 2015-10-29 MED ORDER — METHYLPREDNISOLONE SODIUM SUCC 125 MG IJ SOLR
125.0000 mg | Freq: Once | INTRAMUSCULAR | Status: AC
  Administered 2015-10-29: 125 mg via INTRAMUSCULAR

## 2015-10-29 MED ORDER — PREDNISONE 50 MG PO TABS: ORAL_TABLET | ORAL | Status: DC

## 2015-10-29 MED ORDER — METHYLPREDNISOLONE SODIUM SUCC 125 MG IJ SOLR
INTRAMUSCULAR | Status: AC
  Filled 2015-10-29: qty 2

## 2015-10-29 NOTE — ED Notes (Signed)
Pt  Is  Allergic  To  Peanuts    And  She  Ate a  Peanut    Bar      Yesterday      She  Developed  Hives   And  Is  itching   And  Has   Some  Swelling            she  Took  An  Epi  Pen last  Pm            As  Well  As  Benadryl

## 2015-10-29 NOTE — ED Provider Notes (Signed)
CSN: [id number]     Arrival date & time 10/29/15  1313 History   None    Chief Complaint  Patient presents with  . Urticaria   (Consider location/radiation/quality/duration/timing/severity/associated sxs/prior Treatment) Patient is a 25 y.o. female presenting with urticaria. The history is provided by the patient.  Urticaria This is a new problem. The current episode started yesterday (known peanut allergy and ate bar  thurs which triggered rxn.). The problem has not changed since onset.Pertinent negatives include no shortness of breath. Associated symptoms comments: Pruritis, hives.    Past Medical History  Diagnosis Date  . Asthma   . Angio-edema   . Recurrent upper respiratory infection (URI)   . Urticaria    Past Surgical History  Procedure Laterality Date  . Cesarean section    . Cesarean section  09/16/2010   Family History  Problem Relation Age of Onset  . Asthma Maternal Grandmother   . Allergic rhinitis Neg Hx   . Angioedema Neg Hx   . Atopy Neg Hx   . Eczema Neg Hx   . Immunodeficiency Neg Hx   . Urticaria Neg Hx    Social History  Substance Use Topics  . Smoking status: Never Smoker   . Smokeless tobacco: Never Used  . Alcohol Use: No   OB History    No data available     Review of Systems  Constitutional: Negative.   Respiratory: Negative for shortness of breath.   All other systems reviewed and are negative.   Allergies  Effexor; Other; and Shellfish allergy  Home Medications   Prior to Admission medications   Medication Sig Start Date End Date Taking? Authorizing Provider  albuterol (PROVENTIL HFA;VENTOLIN HFA) 108 (90 Base) MCG/ACT inhaler Inhale 2 puffs into the lungs every 4 (four) hours as needed for wheezing or shortness of breath (cough). 07/28/15   Barbaraann Barthel, MD  budesonide-formoterol [facility]) 160-4.5 MCG/ACT inhaler Inhale 2 puffs into the lungs 2 (two) times daily. Patient is out of this medication 09/22/15   Cristal Ford,  MD  EPINEPHrine (EPIPEN 2-PAK) 0.3 mg/0.3 mL IJ SOAJ injection Inject 0.3 mLs (0.3 mg total) into the muscle once. 03/17/15   Cristal Ford, MD  fluticasone (FLONASE) 50 MCG/ACT nasal spray Place 2 sprays into both nostrils daily. Patient not taking: Reported on 10/14/2015 03/22/15   Cristal Ford, MD  HYDROcodone-homatropine [facility]) 5-1.5 MG/5ML syrup Take 5 mLs by mouth every 6 (six) hours as needed for cough. Patient not taking: Reported on 10/14/2015 07/28/15   Barbaraann Barthel, MD  ibuprofen (ADVIL,MOTRIN) 200 MG tablet Take 400 mg by mouth every 6 (six) hours as needed for moderate pain.    Historical Provider, MD  levocetirizine (XYZAL) 5 MG tablet Take 1 tablet (5 mg total) by mouth every evening. Patient not taking: Reported on 10/14/2015 03/22/15   Cristal Ford, MD  predniSONE (DELTASONE) 50 MG tablet Start on sun, take until finished.1 tab daily for 2 days then 1/2 tab daily for 2 days. 10/29/15   Linna Hoff, MD   Meds Ordered and Administered this Visit   Medications  diphenhydrAMINE (BENADRYL) injection 50 mg (not administered)  EPINEPHrine (ADRENALIN) injection 0.3 mg (not administered)  methylPREDNISolone sodium succinate (SOLU-MEDROL) 125 mg/2 mL injection 125 mg (not administered)    BP 87/52 mmHg  Pulse 69  Temp(Src) 98.6 F (37 C) (Oral)  SpO2 96% No data found.   Physical Exam  Constitutional: She is oriented to person, place, and  time. She appears well-developed and well-nourished. No distress.  HENT:  Mouth/Throat: Oropharynx is clear and moist.  Neck: Normal range of motion. Neck supple.  Cardiovascular: Normal rate, regular rhythm, normal heart sounds and intact distal pulses.   Pulmonary/Chest: Effort normal and breath sounds normal.  Neurological: She is alert and oriented to person, place, and time.  Skin: Skin is warm and dry.  Nursing note and vitals reviewed.   ED Course  Procedures (including critical care time)  Labs Review Labs  Reviewed - No data to display  Imaging Review No results found.   Visual Acuity Review  Right Eye Distance:   Left Eye Distance:   Bilateral Distance:    Right Eye Near:   Left Eye Near:    Bilateral Near:         MDM   1. Allergy to peanuts        Linna HoffJames D Ecko Beasley, MD 10/29/15 [phone]

## 2015-11-01 ENCOUNTER — Ambulatory Visit (INDEPENDENT_AMBULATORY_CARE_PROVIDER_SITE_OTHER): Payer: Managed Care, Other (non HMO) | Admitting: Allergy and Immunology

## 2015-11-01 ENCOUNTER — Encounter: Payer: Self-pay | Admitting: Allergy and Immunology

## 2015-11-01 VITALS — BP 108/72 | HR 72 | Resp 20

## 2015-11-01 DIAGNOSIS — J4540 Moderate persistent asthma, uncomplicated: Secondary | ICD-10-CM

## 2015-11-01 DIAGNOSIS — T7800XA Anaphylactic reaction due to unspecified food, initial encounter: Secondary | ICD-10-CM

## 2015-11-01 DIAGNOSIS — J3089 Other allergic rhinitis: Secondary | ICD-10-CM | POA: Diagnosis not present

## 2015-11-01 DIAGNOSIS — L509 Urticaria, unspecified: Secondary | ICD-10-CM

## 2015-11-01 MED ORDER — MONTELUKAST SODIUM 10 MG PO TABS: 10.0000 mg | ORAL_TABLET | Freq: Every day | ORAL | Status: DC

## 2015-11-01 NOTE — Progress Notes (Signed)
Follow-up Note  Referring Provider: No ref. provider found Primary Provider: No PCP Per Patient Date of Office Visit: 11/01/2015  Subjective:   Danielle Velez (DOB: 15-Sep-1990) is a 25 y.o. female who [facility] in re-evaluation of the following:  HPI: Danielle Velez presents to this clinic in evaluation of urticaria. She's been seen by Dr. Nunzio Cobbs in the past for intermittent issues with idiopathic urticaria. Apparently her last outbreak was approximately 4 months ago and she did quite well without any medications until the past week at which time she developed diffuse urticaria requiring her to go to the urgent care center and get treated with systemic steroid shot and prednisone administration. She still continues to have problems in the face of this therapy even though she also is adding in Benadryl multiple times a day. There is no obvious provoking factor giving rise to this issue. She has not had an infectious disease and she does not think that she is pregnant. However, it should be noted that she did start herbal life shake about 2 weeks prior to the onset of her urticaria and she started 6 different herbal life supplement pills on the day of her urticaria. She did discontinue these agents after 2 days of urticaria. She did eat a peanut bar a few days before her urticaria but apparently on previous skin tests she was not allergic to peanut. She does have history of food allergy directed against shellfish. 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 She does have good control of these conditions while using her nasal steroid and inhaled steroid and long-acting bronchodilator combination.     Medication List           albuterol 108 (90 Base) MCG/ACT inhaler  Commonly known as:  PROVENTIL  HFA;VENTOLIN HFA  Inhale 2 puffs into the lungs every 4 (four) hours as needed for wheezing or shortness of breath (cough).     budesonide-formoterol 160-4.5 MCG/ACT inhaler  Commonly known as:  SYMBICORT  Inhale 2 puffs into the lungs 2 (two) times daily. Patient is out of this medication     diphenhydrAMINE 25 MG tablet  Commonly known as:  BENADRYL  Take 50 mg by mouth every 4 (four) hours as needed.     EPINEPHrine 0.3 mg/0.3 mL Soaj injection  Commonly known as:  EPIPEN 2-PAK  Inject 0.3 mLs (0.3 mg total) into the muscle once.     fluticasone 50 MCG/ACT nasal spray  Commonly known as:  FLONASE  Place 2 sprays into both nostrils daily.     levocetirizine 5 MG tablet  Commonly known as:  XYZAL  Take 1 tablet (5 mg total) by mouth every evening.     predniSONE 50 MG tablet  Commonly known as:  DELTASONE  Start on sun, take until finished.1 tab daily for 2 days then 1/2 tab daily for 2 days.        Past Medical History  Diagnosis Date  . Asthma   . Angio-edema   . Recurrent upper respiratory infection (URI)   . Urticaria     Past Surgical History  Procedure Laterality Date  . Cesarean section    . Cesarean section  09/16/2010    Allergies  Allergen Reactions  . Effexor [Venlafaxine] Other (See Comments)    Shaky   . Other  Hives    DAIRY and WHEAT.  REACTION HIVES AND ITCHING.  Marland Kitchen. Shellfish Allergy Itching and Swelling    Review of systems negative except as noted in HPI / PMHx or noted below:  Review of Systems  Constitutional: Negative.   HENT: Negative.   Eyes: Negative.   Respiratory: Negative.   Cardiovascular: Negative.   Gastrointestinal: Negative.   Genitourinary: Negative.   Musculoskeletal: Negative.   Skin: Negative.   Neurological: Negative.   Endo/Heme/Allergies: Negative.   Psychiatric/Behavioral: Negative.      Objective:   Filed Vitals:   11/01/15 0810  BP: 108/72  Pulse: 72  Resp: 20          Physical Exam    Constitutional: She is well-developed, well-nourished, and in no distress.  HENT:  Head: Normocephalic.  Right Ear: Tympanic membrane, external ear and ear canal normal.  Left Ear: Tympanic membrane, external ear and ear canal normal.  Nose: Nose normal. No mucosal edema or rhinorrhea.  Mouth/Throat: Uvula is midline, oropharynx is clear and moist and mucous membranes are normal. No oropharyngeal exudate.  Eyes: Conjunctivae are normal.  Neck: Trachea normal. No tracheal tenderness present. No tracheal deviation present. No thyromegaly present.  Cardiovascular: Normal rate, regular rhythm, S1 normal, S2 normal and normal heart sounds.   No murmur heard. Pulmonary/Chest: Breath sounds normal. No stridor. No respiratory distress. She has no wheezes. She has no rales.  Musculoskeletal: She exhibits no edema.  Lymphadenopathy:       Head (right side): No tonsillar adenopathy present.       Head (left side): No tonsillar adenopathy present.    She has no cervical adenopathy.  Neurological: She is alert. Gait normal.  Skin: Rash (Urticaria forearms bilaterally) noted. She is not diaphoretic. No erythema. Nails show no clubbing.  Psychiatric: Mood and affect normal.    Diagnostics:    Spirometry was performed and demonstrated an FEV1 of 2.09 at 88 % of predicted.  The patient had an Asthma Control Test with the following results: ACT Total Score: 21.    Assessment and Plan:   1. Urticaria   2. Allergy with anaphylaxis due to food, initial encounter   3. Moderate persistent asthma, uncomplicated   4. Allergic rhinitis     1. Every day utilize the following medications:   A. cetirizine 10 mg one tablet twice a day  B. ranitidine 150 mg one tablet twice a day  C. montelukast 10 mg one tablet once a day  2. Finish out prednisone taper  3. May continue to use Benadryl as needed  4. Continue Symbicort 160- 2 inhalations twice a day   5. Continue nasal fluticasone 1-2 sprays each  nostril once a day  6. Continue Proventil or Ventolin HFA if needed  7. Continue EpiPen if needed  8. Follow-up with Dr. Nunzio CobbsBobbitt in 2 weeks or sooner if problem  It is not entirely clear what has set off Danielle Velez's immune system but certainly something has and we'll treat her with the medications noted above and if she continues to have issues over the course the next week or social require further evaluation and treatment. I've asked her to follow with Dr. Nunzio CobbsBobbitt concerning this issue. Obviously she is going to stay away from her herbal life products at this point in time.  Laurette SchimkeEric Delaila Nand, MD [facility]

## 2015-11-01 NOTE — Patient Instructions (Addendum)
  1. Every day utilize the following medications:   A. cetirizine 10 mg one tablet twice a day  B. ranitidine 150 mg one tablet twice a day  C. montelukast 10 mg one tablet once a day  2. Finish out prednisone taper  3. May continue to use Benadryl as needed  4. Continue Symbicort 160- 2 inhalations twice a day   5. Continue nasal fluticasone 1-2 sprays each nostril once a day  6. Continue Proventil or Ventolin HFA if needed  7. Continue EpiPen if needed  8. Follow-up with Dr. Nunzio CobbsBobbitt in 2 weeks or sooner if problem

## 2016-04-19 ENCOUNTER — Encounter: Payer: Self-pay | Admitting: Allergy & Immunology

## 2016-04-19 ENCOUNTER — Ambulatory Visit (INDEPENDENT_AMBULATORY_CARE_PROVIDER_SITE_OTHER): Payer: Managed Care, Other (non HMO) | Admitting: Allergy & Immunology

## 2016-04-19 ENCOUNTER — Encounter (INDEPENDENT_AMBULATORY_CARE_PROVIDER_SITE_OTHER): Payer: Self-pay

## 2016-04-19 VITALS — BP 112/76 | HR 72 | Temp 98.4°F | Resp 16 | Ht 60.75 in | Wt 190.2 lb

## 2016-04-19 DIAGNOSIS — T781XXD Other adverse food reactions, not elsewhere classified, subsequent encounter: Secondary | ICD-10-CM

## 2016-04-19 DIAGNOSIS — L501 Idiopathic urticaria: Secondary | ICD-10-CM | POA: Diagnosis not present

## 2016-04-19 DIAGNOSIS — J3089 Other allergic rhinitis: Secondary | ICD-10-CM | POA: Diagnosis not present

## 2016-04-19 DIAGNOSIS — J4540 Moderate persistent asthma, uncomplicated: Secondary | ICD-10-CM

## 2016-04-19 DIAGNOSIS — R091 Pleurisy: Secondary | ICD-10-CM | POA: Diagnosis not present

## 2016-04-19 MED ORDER — ALBUTEROL SULFATE HFA 108 (90 BASE) MCG/ACT IN AERS: 2.0000 | INHALATION_SPRAY | RESPIRATORY_TRACT | 4 refills | Status: DC | PRN

## 2016-04-19 MED ORDER — BUDESONIDE-FORMOTEROL FUMARATE 160-4.5 MCG/ACT IN AERO: 2.0000 | INHALATION_SPRAY | Freq: Two times a day (BID) | RESPIRATORY_TRACT | 5 refills | Status: DC

## 2016-04-19 MED ORDER — MONTELUKAST SODIUM 10 MG PO TABS: 10.0000 mg | ORAL_TABLET | Freq: Every day | ORAL | 5 refills | Status: DC

## 2016-04-19 NOTE — Patient Instructions (Addendum)
1. Pleurisy - recurrent (intractable to ibuprofen) - We did provide a packet of steroids today: 20mg  prednisone twice daily for four days with 20mg  once on the 5th day - Continue with ibuprofen 600mg  every 6-8 hours.  2. Moderate persistent asthma, uncomplicated - Daily controller medication(s): Symbicort 160/4.5 one puff twice daily with spacer - Rescue medications: ProAir 4 puffs every 4-6 hours as needed - Changes during respiratory infections or worsening symptoms: increase Symbicort 160/4.5 to 2 puffs twice daily for TWO WEEKS. - Asthma control goals:  * Full participation in all desired activities (may need albuterol before activity) * Albuterol use two time or less a week on average (not counting use with activity) * Cough interfering with sleep two time or less a month * Oral steroids no more than once a year * No hospitalizations  3. Return in about 3 months (around 07/18/2016).  Please inform us of any Emergency Department visits, hospitalizations, or changes in symptoms. Call us before going to the ED for breathing or allergy symptoms since we might be able to fit you in for a sick visit. Feel free to contact us anytime with any questions, problems, or concerns.  It was a pleasure to meet you today! Have a wonderful holiday season!   Websites that have reliable patient information: 1. American Academy of Asthma, Allergy, and Immunology: www.aaaai.org 2. Food Allergy Research and Education (FARE): foodallergy.org 3. Mothers of Asthmatics: http://www.asthmacommunitynetwork.org 4. American College of Allergy, Asthma, and Immunology: www.acaai.org

## 2016-04-19 NOTE — Progress Notes (Addendum)
FOLLOW UP  Date of Service/Encounter:  04/19/16   Assessment:   Moderate persistent asthma, uncomplicated  Recurrent pleurisy  Idiopathic urticaria  Allergic rhinitis  Adverse food reaction (shellfish)   Asthma Reportables:  Severity: moderate persistent  Risk: high due to recurrent prednisone use  Control: not well controlled  Seasonal Influenza Vaccine: yes    Plan/Recommendations:   1. Pleurisy - recurrent (currently intractable to ibuprofen) - We did provide a packet of steroids today: 52m prednisone twice daily for four days with 2100monce on the 5th day - Continue with ibuprofen 60040mvery 6-8 hours. - Recurrent pleurisy can be seen autoimmune conditions such as lupus or auto-inflammatory syndromes such as Familial Mediterranean fever.  - Also in the differential are vocal cord dysfunction, therefore an ENT referral could prove helpful.  - Review of her chart after the visit shows that there are some labs ordered from her visit in November 2016 that were never collected, including inflammatory markers and an ANA. - The above testing could be enlightening, therefore I will have our staff call Ms. Mehlhoff to recommend that she get a CRP, ESR, CBC with differential, and an ANA.  2. Moderate persistent asthma, uncomplicated - Daily controller medication(s): Symbicort 160/4.5 one puff twice daily with spacer - Rescue medications: ProAir 4 puffs every 4-6 hours as needed - Changes during respiratory infections or worsening symptoms: increase Symbicort 160/4.5 to 2 puffs twice daily for TWO WEEKS. - Asthma control goals:  * Full participation in all desired activities (may need albuterol before activity) * Albuterol use two time or less a week on average (not counting use with activity) * Cough interfering with sleep two time or less a month * Oral steroids no more than once a year * No hospitalizations  3. Return in about 3 months (around 07/18/2016).  Subjective:    KenDestany Severns a [age]o. female presenting today for follow up of  Chief Complaint  Patient presents with  . Asthma    chest pain for about a week, she has to sleep with her left arm up due to the pain- patient states she has had 4 asthma attacks in the week  . Wheezing    couple of days  . Cough    for about a week/ dry cough  .  [place] a history of the following: Patient Active Problem List   Diagnosis Date Noted  . Moderate persistent asthma 03/22/2015  . Food allergy 03/22/2015  . Asthma with acute exacerbation 03/09/2015  . Idiopathic urticaria 03/09/2015  . Allergic rhinitis 03/09/2015    History obtained from: chart review and patient.  [name] referred by No PCP Per Patient.     KenKimaya a [age]o. female presenting for a sick visit. She was last seen by Dr. KozNeldon Mc June 2017. At that time, she was complaining of urticaria. There did not seem to be a common trigger although she had recently started her Her[employer] Life protein shakes and supplements. Dr. KozNeldon Mccommended the histamine blockade with cetirizine 10 mg twice a day, ranitidine 150 mg twice a day, as well as Singulair 10 mg daily. Prior to that, she had been evaluated by Dr. BobVerlin Fester November 2016. At that time, she was positive to grasses, weeds, ragweed, trees, mold, cat, dog, dust mite, and cockroach. Food testing was positive to shellfish which was consistent with her history. It was also recommended that she keep a journal describing any foods and  triggers for her symptoms.  Since last visit, she has generally done well. However, she has developed chest pain for the past 1-2 weeks without much improvement. She does have a history of recurrent pleurisy. She estimates that she has pleurisy approximately 40 times per calendar year. Typically, she treats this with ibuprofen with resolution of her chest pain over the course of one to 2 days. She has been using ibuprofen now for a couple of  weeks without much improvement in her symptoms. She has been having these episodes of pleurisy since she was teenager. She has seen a pulmonologist approximately 3 years ago and was treated with prednisone with resolution of her symptoms. At home, she does have 6-7 bottles of unfinished prednisone. She has since used these up with previous episodes of pleurisy and has no more prednisone at home to treat this current episode. She does not have a history of fevers, joint pains, or unexplained rashes aside from the urticaria. She is not aware of any autoimmune issues. She has never seen a rheumatologist.  In further characterization of this chest pain, she can re-create the pain with palpation of the left side of her chest extending from absolutely her midsternum near her nipple all the way around to the back of her shoulder. This is exactly the similar pain that she has when she has been diagnosed with pleurisy in the past. There is no overlying rash in this area. There is no itching sensation. Over the past week, she does report that she has had for "asthma attacks". Essentially, she has shortness of breath requiring the use of her rescue inhaler which she has done 4 times this past week. She has tried using her inhaler for the chest pain which is providing minimal relief.  She reports that she has had chest pain constantly. She has been taking ibuprofen which helps transiently. When she tries to yawn she is unable to get it out. She feels that her breaths get "struck". She feels that she has been having asthma attacks (three on Tuesday and one yesterday). She has been using her rescue inhaler which does not provide much relief.   She does endorse some stress in her life. 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 However, her stress is no different than it has been in the past year. One notable change is that she had her Nexplanon removed in the  past month. Following this, she did have some back pains and cramps, but these pains were different than her current chest pain.  She does think that her asthma for the most part has been under good control. Currently, she is using Symbicort 160/4.5 one puff twice daily. She does note that use of Provera over the past few days has not helped her chest pain. She has been able to speak in full sentences and has not missed any work. She has not sought help in the ED setting.   Otherwise, there have been no changes to her past medical history, surgical history, family history, or social history.    Review of Systems: a 14-point review of systems is pertinent for what is mentioned in HPI.  Otherwise, all other systems were negative. Constitutional: negative other than that listed in the HPI Eyes: negative other than that listed in the HPI Ears, nose, mouth, throat, and face: negative other than that listed in the HPI Respiratory: negative other than that listed in the HPI Cardiovascular: negative  other than that listed in the HPI Gastrointestinal: negative other than that listed in the HPI Genitourinary: negative other than that listed in the HPI Integument: negative other than that listed in the HPI Hematologic: negative other than that listed in the HPI Musculoskeletal: negative other than that listed in the HPI Neurological: negative other than that listed in the HPI Allergy/Immunologic: negative other than that listed in the HPI    Objective:   Blood pressure 112/76, pulse 72, temperature 98.4 F (36.9 C), temperature source Oral, resp. rate 16, height 5' 0.75" (1.543 m), weight 190 lb 3.2 oz (86.3 kg). Body mass index is 36.23 kg/m.   Physical Exam:  General: Alert, interactive, in no acute distress. Cooperative with the exam. Speaks in full sentences. Appears fairly comfortable. Eyes: No conjunctival injection present on the right, No conjunctival injection on the left and PERRL  bilaterally Ears: Right TM pearly gray with normal light reflex and Left TM pearly gray with normal light reflex.  Nose/Throat: External nose within normal limits, turbinates edematous and pale with clear discharge, post-pharynx erythematous. Neck: Supple without thyromegaly. Lungs: Clear to auscultation without wheezing, rhonchi or rales. No increased work of breathing. She does appear comfortable when she takes a deep breath. There is pain that is reproducible when her chest is palpated directly. CV: Physiologic splitting of S1/S2, no murmurs. Capillary refill <2 seconds.  Abdomen: Nondistended, nontender. No guarding or rebound tenderness. Bowel sounds faint and present in all fields  Skin: Warm and dry, without lesions or rashes. Extremities:  No clubbing, cyanosis or edema. Neuro:   Grossly intact. No focal deficits appreciated. Responsive to questions.   Diagnostic studies:  Spirometry: results normal (FEV1: 2.15%, FVC: 87%, FEV1/FVC: 2.58%). Compared to her values obtained at the last visit, her values are completely stable. Last albuterol was given early this morning (>4 hours ago). Spirometry consistent with normal pattern.   Allergy Studies:  None     Salvatore Marvel, MD [facility]

## 2016-04-20 ENCOUNTER — Telehealth: Payer: Self-pay

## 2016-04-20 NOTE — Telephone Encounter (Signed)
Clld pt - per provider - advsd of lab work needed due to her pleurisy. Pt stated she works in [place] - advsd she can pick up lab requisition at the [place] office. Pt stated she would.

## 2016-04-23 ENCOUNTER — Telehealth: Payer: Self-pay | Admitting: *Deleted

## 2016-04-23 NOTE — Telephone Encounter (Signed)
Clld pt - LMOVM lab req will be at the [place] office for her to pick up on Tuesday.

## 2016-04-23 NOTE — Telephone Encounter (Signed)
-----   Message from Alfonse SpruceJoel Louis Gallagher, MD sent at 04/19/2016  6:06 PM EST ----- Hey there!   Could someone call Ms. Folsom to let her know that I ordered labs due to her history of recurrent pleurisy? Thanks!   Malachi BondsJoel Gallagher, MD [facility] [place] [place]

## 2016-05-23 ENCOUNTER — Encounter: Payer: Self-pay | Admitting: Allergy & Immunology

## 2016-05-23 ENCOUNTER — Ambulatory Visit (INDEPENDENT_AMBULATORY_CARE_PROVIDER_SITE_OTHER): Payer: Managed Care, Other (non HMO) | Admitting: Allergy & Immunology

## 2016-05-23 VITALS — BP 122/88 | HR 62 | Temp 97.8°F

## 2016-05-23 DIAGNOSIS — J3089 Other allergic rhinitis: Secondary | ICD-10-CM

## 2016-05-23 DIAGNOSIS — J019 Acute sinusitis, unspecified: Secondary | ICD-10-CM | POA: Diagnosis not present

## 2016-05-23 DIAGNOSIS — R091 Pleurisy: Secondary | ICD-10-CM | POA: Diagnosis not present

## 2016-05-23 DIAGNOSIS — J4541 Moderate persistent asthma with (acute) exacerbation: Secondary | ICD-10-CM | POA: Diagnosis not present

## 2016-05-23 MED ORDER — AMOXICILLIN-POT CLAVULANATE 875-125 MG PO TABS: 1.0000 | ORAL_TABLET | Freq: Two times a day (BID) | ORAL | 0 refills | Status: AC

## 2016-05-23 MED ORDER — ALBUTEROL SULFATE HFA 108 (90 BASE) MCG/ACT IN AERS: 4.0000 | INHALATION_SPRAY | RESPIRATORY_TRACT | 2 refills | Status: DC | PRN

## 2016-05-23 NOTE — Patient Instructions (Addendum)
1. Pleurisy - recurrent (intractable to ibuprofen) -I would like you to get that lab work when you get a chance.  - I want to make sure that you do not have a rheumatological disease going on.   2. Moderate persistent asthma, with acute exacerbation - Lung function was consistent with an asthma attack today. - Start the prednisone pack prescribed by Urgent Care. - Daily controller medication(s): Symbicort 160/4.5 one puff twice daily with spacer - Rescue medications: ProAir 4 puffs every 4-6 hours as needed or albuterol nebulizer one vial puffs every 4-6 hours as needed - Changes during respiratory infections or worsening symptoms: increase Symbicort 160/4.5 to 2 puffs twice daily for TWO WEEKS. - Asthma control goals:  * Full participation in all desired activities (may need albuterol before activity) * Albuterol use two time or less a week on average (not counting use with activity) * Cough interfering with sleep two time or less a month * Oral steroids no more than once a year * No hospitalizations  3. Sinus infection - Start Augmentin one tablet twice daily for two weeks. - Continue with Mucinex.  - Try using nasal saline rinses.   4. Allergic rhinitis - Start Flonase one spray per nostril daily. - Continue with Zyrtec 10mg  daily. - I would recommend starting allergy shots. - Talk to your insurance company about coverage and then call us to let us know what you decide for sure.   5. Return in about 3 months (around 08/21/2016).  Please inform us of any Emergency Department visits, hospitalizations, or changes in symptoms. Call us before going to the ED for breathing or allergy symptoms since we might be able to fit you in for a sick visit. Feel free to contact us anytime with any questions, problems, or concerns.  It was a pleasure to see you again today! Sorry we had to see each other under these circumstances.   Websites that have reliable patient information: 1. American  Academy of Asthma, Allergy, and Immunology: www.aaaai.org 2. Food Allergy Research and Education (FARE): foodallergy.org 3. Mothers of Asthmatics: http://www.asthmacommunitynetwork.org 4. American College of Allergy, Asthma, and Immunology: www.acaai.org

## 2016-05-23 NOTE — Progress Notes (Signed)
FOLLOW UP  Date of Service/Encounter:  05/23/16   Assessment:   Moderate persistent asthma with acute exacerbation  Acute sinusitis  Allergic rhinitis   Pleurisy   Asthma Reportables:  Severity: moderate persistent  Risk: high Control: not well controlled  Seasonal Influenza Vaccine: refused    Plan/Recommendations:   1. Pleurisy - recurrent (intractable to ibuprofen) - I encouraged [name] to get the lab work that was ordered at the last visit. - This is very important for ensuring that there is not a rheumatological process going on.   2. Moderate persistent asthma, with acute exacerbation - Lung function was consistent with an asthma attack today. - She did receive on IM dose of DepoMedrol today in clinic. - I recommended that she start the prednisone pack prescribed by Urgent Care. - Considerations for future control of her asthma: allergen immunotherapy, anti-IL5 agents (AEC 200 in October 2016) - Daily controller medication(s): Symbicort 160/4.5 one puff twice daily with spacer - Rescue medications: ProAir 4 puffs every 4-6 hours as needed or albuterol nebulizer one vial puffs every 4-6 hours as needed - Changes during respiratory infections or worsening symptoms: increase Symbicort 160/4.5 to 2 puffs twice daily for TWO WEEKS. - Asthma control goals:  * Full participation in all desired activities (may need albuterol before activity) * Albuterol use two time or less a week on average (not counting use with activity) * Cough interfering with sleep two time or less a month * Oral steroids no more than once a year * No hospitalizations  3. Sinus infection - Start Augmentin one tablet twice daily for two weeks. - Continue with Mucinex (samples provided) - I recommended using nasal saline rinses.   4. Allergic rhinitis - Start Flonase one spray per nostril daily. - Continue with Zyrtec 10mg  daily. - I recommended starting allergy shots (last testing  performed in November 2016 and was positive to grasses, weeds, trees, molds, dust mite, cat, dog, and cockroach - Information provided to patient so that she can contact her insurance company to check on coverage. - Patient will call us back when she makes a firm decision.   5. Return in about 3 months (around 08/21/2016).    Subjective:   Danielle Velez is a 26 y.o. female presenting today for follow up of  Chief Complaint  Patient presents with  . Follow-up    Sick visit. SOB, wheezing since Sunday. C/O tightness in chest.     Danielle Velez has a history of the following: Patient Active Problem List   Diagnosis Date Noted  . Moderate persistent asthma 03/22/2015  . Food allergy 03/22/2015  . Asthma with acute exacerbation 03/09/2015  . Idiopathic urticaria 03/09/2015  . Allergic rhinitis 03/09/2015    History obtained from: chart review and patient.  Danielle Velez was referred by No PCP Per Patient.     Danielle Velez is a 26 y.o. female presenting for a sick visit. She was last seen by myself in December 2017. At that time, we continued her on Symbicort 160 one puff twice daily. She has a history of recurrent pleurisy and we gave her a small burst of steroids. We recommended getting some lab work to look for autoimmune conditions that predispose to pleurisy, but it appears that these have not been done.  Since last visit, she has not done well especially over the last 4-5 days. She developed a sore throat on Saturday and then had body aches with chills. She felt at first that she had  the flu. Yesterday she developed some chest pain (different from her pleurisy) and then today developed some shortness of breath that was relieved by albuterol. She went to Urgent Care last night where she received an albuterol nebulizer treatment and was given a prednisone burst which she has not started yet. 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 She  endorses postnasal drip and sinus pain with discharge.   Allergic rhinitis is not well controlled with the current regimen. She was on allergy shots for a period of time when she lived in [place] and did feel some improvement with this. She is open to starting them again but has not checked with her insurance company to see what is covered.   Otherwise, there have been no changes to her past medical history, surgical history, family history, or social history.    Review of Systems: a 14-point review of systems is pertinent for what is mentioned in HPI.  Otherwise, all other systems were negative. Constitutional: negative other than that listed in the HPI Eyes: negative other than that listed in the HPI Ears, nose, mouth, throat, and face: negative other than that listed in the HPI Respiratory: negative other than that listed in the HPI Cardiovascular: negative other than that listed in the HPI Gastrointestinal: negative other than that listed in the HPI Genitourinary: negative other than that listed in the HPI Integument: negative other than that listed in the HPI Hematologic: negative other than that listed in the HPI Musculoskeletal: negative other than that listed in the HPI Neurological: negative other than that listed in the HPI Allergy/Immunologic: negative other than that listed in the HPI    Objective:   Blood pressure 122/88, pulse 62, temperature 97.8 F (36.6 C), temperature source Oral, SpO2 92 %. There is no height or weight on file to calculate BMI.   Physical Exam:  General: Alert, interactive, in mild distress. Not speaking in full sentences initially.  Eyes: No conjunctival injection present on the right, No conjunctival injection present on the left, PERRL bilaterally, No discharge on the right, No discharge on the left and No Horner-Trantas dots present Ears: Right TM pearly gray with normal light reflex, Left TM pearly gray with normal light reflex, Right TM  intact without perforation and Left TM intact without perforation.  Nose/Throat: External nose within normal limits, nasal crease present and septum midline, turbinates markedly edematous and pale with clear discharge, post-pharynx markedly erythematous with cobblestoning in the posterior oropharynx. Tonsils 2+ without exudates Neck: Supple without thyromegaly. Lungs: Decreased breath sounds with expiratory wheezing bilaterally. Increased work of breathing. CV: Normal S1/S2, no murmurs. Capillary refill <2 seconds.  Skin: Warm and dry, without lesions or rashes. Neuro:   Grossly intact. No focal deficits appreciated. Responsive to questions.   Diagnostic studies:  Spirometry: results abnormal (FEV1: 1.75/61%, FVC: 2.20/67%, FEV1/FVC: 80%).    Spirometry consistent with mixed obstructive and restrictive disease. Albuterol/Atrovent nebulizer treatment but no post-bronchodilator was obtained.   Allergy Studies: None     Malachi Bonds, MD [facility]

## 2016-06-04 MED ORDER — METHYLPREDNISOLONE ACETATE 80 MG/ML IJ SUSP
80.0000 mg | Freq: Once | INTRAMUSCULAR | Status: AC
  Administered 2016-05-23: 80 mg via INTRAMUSCULAR

## 2016-06-04 NOTE — Addendum Note (Signed)
Addended by: Clifton JamesLARK, Graciana Sessa L on: 06/04/2016 09:00 AM   Modules accepted: Orders

## 2016-07-18 ENCOUNTER — Ambulatory Visit: Payer: Managed Care, Other (non HMO) | Admitting: Allergy & Immunology

## 2016-07-19 ENCOUNTER — Encounter (INDEPENDENT_AMBULATORY_CARE_PROVIDER_SITE_OTHER): Payer: Self-pay

## 2016-07-19 ENCOUNTER — Ambulatory Visit (INDEPENDENT_AMBULATORY_CARE_PROVIDER_SITE_OTHER): Payer: Managed Care, Other (non HMO) | Admitting: Allergy & Immunology

## 2016-07-19 ENCOUNTER — Encounter: Payer: Self-pay | Admitting: Allergy & Immunology

## 2016-07-19 VITALS — BP 98/62 | HR 70 | Resp 16 | Ht 60.0 in | Wt 190.0 lb

## 2016-07-19 DIAGNOSIS — J4540 Moderate persistent asthma, uncomplicated: Secondary | ICD-10-CM

## 2016-07-19 DIAGNOSIS — R091 Pleurisy: Secondary | ICD-10-CM | POA: Insufficient documentation

## 2016-07-19 DIAGNOSIS — J3089 Other allergic rhinitis: Secondary | ICD-10-CM | POA: Diagnosis not present

## 2016-07-19 DIAGNOSIS — T781XXD Other adverse food reactions, not elsewhere classified, subsequent encounter: Secondary | ICD-10-CM

## 2016-07-19 DIAGNOSIS — L501 Idiopathic urticaria: Secondary | ICD-10-CM | POA: Diagnosis not present

## 2016-07-19 HISTORY — DX: Pleurisy: R09.1

## 2016-07-19 MED ORDER — OMALIZUMAB 150 MG ~~LOC~~ SOLR
300.0000 mg | SUBCUTANEOUS | Status: DC
  Administered 2016-07-19 – 2019-09-16 (×11): 300 mg via SUBCUTANEOUS

## 2016-07-19 NOTE — Patient Instructions (Addendum)
1. Pleurisy - recurrent (intractable to ibuprofen) - Please get those labs today which might tell us why you are having pleurisy.   - I want to make sure that you do not have a rheumatological disease going on.   2. Moderate persistent asthma with current episode of pleurisy - Lung function was normal today, and I think you will do better with the Symbicort restarted.  - Prednisone pack provided today to start in case symptoms worsen. - Daily controller medication(s): Symbicort 160/4.5 two puffs twice daily with spacer - Rescue medications: ProAir 4 puffs every 4-6 hours as needed or albuterol nebulizer one vial puffs every 4-6 hours as needed - Changes during respiratory infections or worsening symptoms: add Asmanex 100mcg with spacer two puffs twice daily for two weeks (sample provided) - Asthma control goals:  * Full participation in all desired activities (may need albuterol before activity) * Albuterol use two time or less a week on average (not counting use with activity) * Cough interfering with sleep two time or less a month * Oral steroids no more than once a year * No hospitalizations  3. Allergic rhinitis - Continue with Flonase one spray per nostril daily. - Continue with Zyrtec 10mg  daily. - I would recommend starting allergy shots. - Talk to your insurance company about coverage and then call us to let us know what you decide for sure.   4. Food allergies - Continue to avoid shellfish, milk products (intolerance only), and wheat products. - EpiPen is up to date.   5. Urticaria (hives) - We will work on Insurance claims handlersubmission for Xolair (monthly injection). - You can take Zyrtec every day.   6. Return in about 3 months (around 10/19/2016).  Please inform us of any Emergency Department visits, hospitalizations, or changes in symptoms. Call us before going to the ED for breathing or allergy symptoms since we might be able to fit you in for a sick visit. Feel free to contact us anytime  with any questions, problems, or concerns.  It was a pleasure to see you again today! Sorry we had to see each other under these circumstances.   Websites that have reliable patient information: 1. American Academy of Asthma, Allergy, and Immunology: www.aaaai.org 2. Food Allergy Research and Education (FARE): foodallergy.org 3. Mothers of Asthmatics: http://www.asthmacommunitynetwork.org 4. American College of Allergy, Asthma, and Immunology: www.acaai.org

## 2016-07-19 NOTE — Progress Notes (Signed)
FOLLOW UP  Date of Service/Encounter:  07/19/16   Assessment:   Moderate persistent asthma without complication  Chronic idiopathic urticaria  Allergic rhinitis  Recurrent pleurisy  Adverse food reaction   Asthma Reportables:  Severity: moderate persistent  Risk: high due to recurrent steroid bursts Control: well controlled    Plan/Recommendations:   1. Pleurisy - recurrent (intractable to ibuprofen)  - I did encourage Danielle Velez to get the labs previously ordered (CRP, ESR, CBC with differential, ANA)  - DDx still includes autoimmune diseases such as lupus or autoinflammatory syndromes.  - The new history of a blood clot is also concerning for an autoimmune etiology.  - I will do some research into a steroid-sparing agent to help with the recurrent pleurisy, although I will likely refer to rheumatology to co-management since they have more experience with this.   - Will await lab results before placing referral.   2. Moderate persistent asthma with current episode of pleurisy - Lung function was normal today, and I think you will do better with the Symbicort restarted.  - Prednisone pack provided today to start in case symptoms worsen. - Daily controller medication(s): Symbicort 160/4.5 two puffs twice daily with spacer - Rescue medications: ProAir 4 puffs every 4-6 hours as needed or albuterol nebulizer one vial puffs every 4-6 hours as needed - Changes during respiratory infections or worsening symptoms: add Asmanex 134mg with spacer two puffs twice daily for two weeks (sample provided) - Asthma control goals:  * Full participation in all desired activities (may need albuterol before activity) * Albuterol use two time or less a week on average (not counting use with activity) * Cough interfering with sleep two time or less a month * Oral steroids no more than once a year * No hospitalizations  3. Allergic rhinitis - Continue with Flonase one spray per nostril  daily. - Continue with Zyrtec 172mdaily. - I would recommend starting allergy shots, but the ability to receive them in her work schedule has been a concern. - [place] [facility] [place]  4. Food allergies - Continue to avoid shellfish, milk products (intolerance only), and wheat products (intolerance only)  - EpiPen is up to date.   5. Chronic idiopathic urticaria - We will work on suNeurosurgeonor Xolair. - First dose (30067mgiven today.  - I did encourage her to restart her Zyrtec to help with the urticaria in the mean time.   6. Return in about 3 months (around 10/19/2016).    Subjective:   Danielle Velez a 26 63o. female presenting today for follow up of  Chief Complaint  Patient presents with  . Asthma    needs symbicort refills. feels like asthma has worsened since running out of Symbicort  . Urticaria    worsened as of late. benardyl is not even helping.     Danielle Velez a history of the following: Patient Active Problem List   Diagnosis Date Noted  . Recurrent pleurisy 07/19/2016  . Moderate persistent asthma 03/22/2015  . Food allergy 03/22/2015  . Asthma with acute exacerbation 03/09/2015  . Idiopathic urticaria 03/09/2015  . Allergic rhinitis 03/09/2015    History obtained from: chart review and patient.  Danielle Chiquitos referred by No PCP Per Patient.     Danielle Velez a 26 43o. female presenting for a follow up visit. She was last seen in January 2018. At that time, she had spirometry that was consistent  with an asthma exacerbation. We gave her a dose of IM Depo-Medrol in clinic and she started a prednisone pack that was prescribed in urgent care. We had discussed starting antiviral 5 agents as a means of helping her control her symptoms. We diagnosed her with sinusitis and started her on Augmentin. She has a history of allergic rhinitis and was continued on Flonase one spray per nostril daily as well as  cetirizine 68m daily.   Since the last visit, she has mostly done well. She ran out of Symbicort two weeks ago and her symptoms have worsened. She has been using her rescue inhaler more often. When she is on it she does well however. Danielle Velez's asthma has been well controlled. Shehas not required rescue medication, experienced nocturnal awakenings due to lower respiratory symptoms, nor have activities of daily living been limited. She has not needed prednisone or any ED or UC visits for her asthma since the last visit.  She does have chest pain today but this is typical for her chest pain. Steroids are the only thin that seem to help. She typically have chest pain around every other week or so. She estimates that she needs prednisone every other month for her pleurisy.   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 Last seizure that she had was years ago in 2009. She has only ever had three for her entire life. She has tried ibuprofen without improvement. Pleurisy is always on the left side. It is not always associated with an asthma exacerbation. She has not gotten the labs we sent for autoimmune etiologies at this time but is planning on going today.  Interestingly, she also has a history of a blood clot diagnosed incidentally when she was in her teenage years.   She also is having hives today which have not been responsive to high dose antihistamines. 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 She is allergic to wheat (dose dependent), shellfish (anaphylaxis).   Otherwise, there have been no changes to her past medical history, surgical history, family history, or social history.    Review of Systems: a 14-point review of systems is pertinent for what is mentioned in HPI.  Otherwise, all other systems were negative. Constitutional: negative other than that listed in the HPI Eyes: negative  other than that listed in the HPI Ears, nose, mouth, throat, and face: negative other than that listed in the HPI Respiratory: negative other than that listed in the HPI Cardiovascular: negative other than that listed in the HPI Gastrointestinal: negative other than that listed in the HPI Genitourinary: negative other than that listed in the HPI Integument: negative other than that listed in the HPI Hematologic: negative other than that listed in the HPI Musculoskeletal: negative other than that listed in the HPI Neurological: negative other than that listed in the HPI Allergy/Immunologic: negative other than that listed in the HPI    Objective:   Blood pressure 98/62, pulse 70, resp. rate 16, height 5' (1.524 m), weight 190 lb (86.2 kg). Body mass index is 37.11 kg/m.   Physical Exam:  General: Alert, interactive, in no acute distress. Pleasant but in mild distress with the current urticarial outbreak.  Eyes: No conjunctival injection present on the right, No conjunctival injection present on the left, PERRL bilaterally, No discharge on the right, No discharge on the left and No Horner-Trantas dots present Ears: Right TM pearly  gray with normal light reflex, Left TM pearly gray with normal light reflex, Right TM intact without perforation and Left TM intact without perforation.  Nose/Throat: External nose within normal limits and septum midline, turbinates edematous and pale with thick discharge, post-pharynx erythematous with cobblestoning in the posterior oropharynx. Tonsils 2+ without exudates Neck: Supple without thyromegaly. Lungs: Clear to auscultation without wheezing, rhonchi or rales. No increased work of breathing. CV: Normal S1/S2, no murmurs. Capillary refill <2 seconds.  Skin: Scattered erythematous urticarial type lesions primarily located bilateral arms as well as neck and upper chest , nonvesicular. Neuro:   Grossly intact. No focal deficits appreciated. Responsive to  questions.   Diagnostic studies:  Spirometry: results normal (FEV1: 1.66/70%, FVC: 2.33/87%, FEV1/FVC: 71%).    Spirometry consistent with normal pattern.    Allergy Studies: None     Salvatore Marvel, MD [facility]

## 2016-08-21 ENCOUNTER — Ambulatory Visit (INDEPENDENT_AMBULATORY_CARE_PROVIDER_SITE_OTHER): Payer: Managed Care, Other (non HMO) | Admitting: *Deleted

## 2016-08-21 DIAGNOSIS — L501 Idiopathic urticaria: Secondary | ICD-10-CM | POA: Diagnosis not present

## 2016-08-21 DIAGNOSIS — J4540 Moderate persistent asthma, uncomplicated: Secondary | ICD-10-CM

## 2016-08-23 ENCOUNTER — Ambulatory Visit: Payer: Managed Care, Other (non HMO) | Admitting: Allergy & Immunology

## 2016-09-18 ENCOUNTER — Ambulatory Visit: Payer: Managed Care, Other (non HMO)

## 2016-09-30 ENCOUNTER — Encounter (HOSPITAL_COMMUNITY): Payer: Self-pay | Admitting: Oncology

## 2016-09-30 ENCOUNTER — Emergency Department (HOSPITAL_COMMUNITY): Payer: Managed Care, Other (non HMO)

## 2016-09-30 ENCOUNTER — Emergency Department (HOSPITAL_COMMUNITY)
Admission: EM | Admit: 2016-09-30 | Discharge: 2016-09-30 | Disposition: A | Discharge status: Home / Self Care | Payer: Managed Care, Other (non HMO) | Attending: Emergency Medicine | Admitting: Emergency Medicine

## 2016-09-30 DIAGNOSIS — J45909 Unspecified asthma, uncomplicated: Secondary | ICD-10-CM | POA: Diagnosis not present

## 2016-09-30 DIAGNOSIS — R1013 Epigastric pain: Secondary | ICD-10-CM | POA: Insufficient documentation

## 2016-09-30 DIAGNOSIS — Z79899 Other long term (current) drug therapy: Secondary | ICD-10-CM | POA: Diagnosis not present

## 2016-09-30 LAB — CBC
HCT: 37.3 % (ref 36.0–46.0)
Hemoglobin: 12.5 g/dL (ref 12.0–15.0)
MCH: 27.1 pg (ref 26.0–34.0)
MCHC: 33.5 g/dL (ref 30.0–36.0)
MCV: 80.9 fL (ref 78.0–100.0)
Platelets: 301 10*3/uL (ref 150–400)
RBC: 4.61 MIL/uL (ref 3.87–5.11)
RDW: 12.5 % (ref 11.5–15.5)
WBC: 9.5 10*3/uL (ref 4.0–10.5)

## 2016-09-30 LAB — URINALYSIS, ROUTINE W REFLEX MICROSCOPIC
Bilirubin Urine: NEGATIVE
Glucose, UA: NEGATIVE mg/dL
Ketones, ur: 5 mg/dL — AB
Nitrite: NEGATIVE
Protein, ur: 30 mg/dL — AB
Specific Gravity, Urine: 1.018 (ref 1.005–1.030)
pH: 5 (ref 5.0–8.0)

## 2016-09-30 LAB — COMPREHENSIVE METABOLIC PANEL
ALT: 36 U/L (ref 14–54)
AST: 37 U/L (ref 15–41)
Albumin: 3.7 g/dL (ref 3.5–5.0)
Alkaline Phosphatase: 61 U/L (ref 38–126)
Anion gap: 11 (ref 5–15)
BUN: 7 mg/dL (ref 6–20)
CO2: 23 mmol/L (ref 22–32)
Calcium: 8.8 mg/dL — ABNORMAL LOW (ref 8.9–10.3)
Chloride: 103 mmol/L (ref 101–111)
Creatinine, Ser: 0.8 mg/dL (ref 0.44–1.00)
GFR calc Af Amer: >60 mL/min (ref >60)
GFR calc non Af Amer: >60 mL/min (ref >60)
Glucose, Bld: 103 mg/dL — ABNORMAL HIGH (ref 65–99)
Potassium: 3.5 mmol/L (ref 3.5–5.1)
Sodium: 137 mmol/L (ref 135–145)
Total Bilirubin: 0.5 mg/dL (ref 0.3–1.2)
Total Protein: 7.5 g/dL (ref 6.5–8.1)

## 2016-09-30 LAB — LIPASE, BLOOD: Lipase: 29 U/L (ref 11–51)

## 2016-09-30 LAB — POC URINE PREG, ED: Preg Test, Ur: NEGATIVE

## 2016-09-30 MED ORDER — ONDANSETRON HCL 4 MG/2ML IJ SOLN
4.0000 mg | Freq: Once | INTRAMUSCULAR | Status: AC
  Administered 2016-09-30: 4 mg via INTRAVENOUS
  Filled 2016-09-30: qty 2

## 2016-09-30 MED ORDER — OMEPRAZOLE 20 MG PO CPDR: 20.0000 mg | DELAYED_RELEASE_CAPSULE | Freq: Every day | ORAL | 0 refills | Status: DC

## 2016-09-30 MED ORDER — PANTOPRAZOLE SODIUM 40 MG IV SOLR
40.0000 mg | Freq: Once | INTRAVENOUS | Status: AC
  Administered 2016-09-30: 40 mg via INTRAVENOUS
  Filled 2016-09-30: qty 40

## 2016-09-30 MED ORDER — FENTANYL CITRATE (PF) 100 MCG/2ML IJ SOLN
50.0000 ug | Freq: Once | INTRAMUSCULAR | Status: AC
  Administered 2016-09-30: 50 ug via INTRAVENOUS
  Filled 2016-09-30: qty 2

## 2016-09-30 NOTE — ED Notes (Signed)
Pt also reports being dx w/ a UTI and BV on Thursday and started taking flagyl and another antibiotic she is unsure of the name of on Friday.

## 2016-09-30 NOTE — ED Triage Notes (Signed)
Pt states that after eating pizza yesterday at approximately 1630 she developed upper abdominal pain, nausea and vomiting.  Pt is unsteady on her feet.  Reports feeling dizzy.  Pt rates pain 10/10, sharp and sore in nature.

## 2016-09-30 NOTE — ED Provider Notes (Signed)
[facility]-EMERGENCY DEPT Provider Note   CSN: [id number] Arrival date & time: 09/30/16  4098     History   Chief Complaint Chief Complaint  Patient presents with  . Abdominal Pain    HPI Danielle Velez is a 26 y.o. female.  The history is provided by the patient.  Abdominal Pain   This is a new problem. The current episode started yesterday. The problem occurs constantly. The problem has been rapidly worsening. The pain is associated with eating. The pain is located in the epigastric region and RUQ. The quality of the pain is sharp. The pain is severe. Associated symptoms include nausea and vomiting. Pertinent negatives include fever, diarrhea and dysuria. The symptoms are aggravated by certain positions and palpation. Nothing relieves the symptoms.  patient has abdominal pain since yesterday after eating pizza She reports nausea/vomiting She has never had this before She reports pain radiates to her back   Past Medical History:  Diagnosis Date  . Angio-edema   . Asthma   . Recurrent upper respiratory infection (URI)   . Urticaria     Patient Active Problem List   Diagnosis Date Noted  . Recurrent pleurisy 07/19/2016  . Moderate persistent asthma 03/22/2015  . Food allergy 03/22/2015  . Asthma with acute exacerbation 03/09/2015  . Idiopathic urticaria 03/09/2015  . Allergic rhinitis 03/09/2015    Past Surgical History:  Procedure Laterality Date  . CESAREAN SECTION    . CESAREAN SECTION  09/16/2010    OB History    No data available       Home Medications    Prior to Admission medications   Medication Sig Start Date End Date Taking? Authorizing Provider  albuterol (PROVENTIL HFA;VENTOLIN HFA) 108 (90 Base) MCG/ACT inhaler Inhale 4 puffs into the lungs every 4 (four) hours as needed for wheezing or shortness of breath (cough). 05/23/16  Yes Alfonse Spruce, MD  budesonide-formoterol [facility]) 160-4.5 MCG/ACT inhaler Inhale 2 puffs into the lungs 2 (two)  times daily. Patient is out of this medication 04/19/16  Yes Alfonse Spruce, MD  cetirizine (ZYRTEC) 10 MG tablet Take 10 mg by mouth daily.   Yes [provider]  EPINEPHrine (EPIPEN 2-PAK) 0.3 mg/0.3 mL IJ SOAJ injection Inject 0.3 mLs (0.3 mg total) into the muscle once. 03/17/15  Yes Bobbitt, Heywood Iles, MD  nitrofurantoin, macrocrystal-monohydrate, (MACROBID) 100 MG capsule Take 100 mg by mouth 2 (two) times daily. 09/28/16  Yes [provider]  tinidazole (TINDAMAX) 500 MG tablet Take 1,000 mg by mouth daily. 09/28/16  Yes [provider]  fluticasone (FLONASE) 50 MCG/ACT nasal spray Place 2 sprays into both nostrils daily. Patient not taking: Reported on 09/30/2016 03/22/15   Bobbitt, Heywood Iles, MD  levocetirizine (XYZAL) 5 MG tablet Take 1 tablet (5 mg total) by mouth every evening. Patient not taking: Reported on 09/30/2016 03/22/15   Bobbitt, Heywood Iles, MD  montelukast (SINGULAIR) 10 MG tablet Take 1 tablet (10 mg total) by mouth at bedtime. Patient not taking: Reported on 09/30/2016 04/19/16   Alfonse Spruce, MD    Family History Family History  Problem Relation Age of Onset  . Asthma Maternal Grandmother   . Allergic rhinitis Neg Hx   . Angioedema Neg Hx   . Atopy Neg Hx   . Eczema Neg Hx   . Immunodeficiency Neg Hx   . Urticaria Neg Hx     Social History Social History  Substance Use Topics  . Smoking status: Never Smoker  .  Smokeless tobacco: Never Used  . Alcohol use No     Allergies   Effexor [venlafaxine]; Other; and Shellfish allergy   Review of Systems Review of Systems  Constitutional: Negative for fever.  Cardiovascular: Negative for chest pain.  Gastrointestinal: Positive for abdominal pain, nausea and vomiting. Negative for diarrhea.  Genitourinary: Negative for dysuria.       Menstrual cycle   Neurological: Positive for dizziness.  All other systems reviewed and are negative.    Physical Exam Updated  Vital Signs BP 127/72 (BP Location: Left Arm)   Pulse 87   Temp 98.2 F (36.8 C) (Oral)   Resp 16   Ht 5' (1.524 m)   Wt 187 lb (84.8 kg)   LMP 09/29/2016 (Exact Date)   SpO2 98%   BMI 36.52 kg/m   Physical Exam CONSTITUTIONAL: Well developed/well nourished, uncomfortable appearing HEAD: Normocephalic/atraumatic EYES: EOMI/PERRL, no icterus ENMT: Mucous membranes moist NECK: supple no meningeal signs SPINE/BACK:entire spine nontender CV: S1/S2 noted, no murmurs/rubs/gallops noted LUNGS: Lungs are clear to auscultation bilaterally, no apparent distress ABDOMEN: soft, moderate epigastric/RUQ tenderness, no rebound or guarding, bowel sounds noted throughout abdomen GU:no cva tenderness NEURO: Pt is awake/alert/appropriate, moves all extremitiesx4.  No facial droop.   EXTREMITIES: pulses normal/equal, full ROM SKIN: warm, color normal PSYCH: no abnormalities of mood noted, alert and oriented to situation    ED Treatments / Results  Labs (all labs ordered are listed, but only abnormal results are displayed) Labs Reviewed  CBC  LIPASE, BLOOD  COMPREHENSIVE METABOLIC PANEL  URINALYSIS, ROUTINE W REFLEX MICROSCOPIC  POC URINE PREG, ED  I-STAT BETA HCG BLOOD, ED ([facility], [facility], [facility] ONLY)    EKG  EKG Interpretation None       Radiology No results found.  Procedures Procedures (including critical care time)  Medications Ordered in ED Medications  fentaNYL (SUBLIMAZE) injection 50 mcg (50 mcg Intravenous Given 09/30/16 0626)  ondansetron (ZOFRAN) injection 4 mg (4 mg Intravenous Given 09/30/16 0620)  pantoprazole (PROTONIX) injection 40 mg (40 mg Intravenous Given 09/30/16 0622)     Initial Impression / Assessment and Plan / ED Course  I have reviewed the triage vital signs and the nursing notes.  Pertinent labs  results that were available during my care of the patient were reviewed by me and considered in my medical decision making (see chart for details).    7:28  AM Pt with continued epigastric/RUQ tenderness Will proceed with US imaging D/w dr Jeraldine Lootslockwood, will f/u on labs/imaging   Final Clinical Impressions(s) / ED Diagnoses   Final diagnoses:  Epigastric pain  Epigastric abdominal pain    New Prescriptions New Prescriptions   No medications on file     Zadie RhineWickline, Umaiza Matusik, MD 09/30/16 [phone]

## 2016-09-30 NOTE — ED Notes (Signed)
Ultrasound call and said they will not be able to do u/s until this after noon. U/s tech does not get her until 9 am.

## 2016-09-30 NOTE — ED Provider Notes (Signed)
Patient awake and alert, calm. We discussed US results, need for f/u w initiation of PPI. VSS Patient d/c.   Gerhard MunchLockwood, Jaydrian Corpening, MD 09/30/16 [phone]

## 2016-09-30 NOTE — ED Notes (Addendum)
Pt mother to take pt son home at this time.

## 2016-09-30 NOTE — ED Notes (Signed)
Pt unable to give urine specimen at this time 

## 2016-09-30 NOTE — Discharge Instructions (Signed)
As discussed, your evaluation today has been largely reassuring.  But, it is important that you monitor your condition carefully, and do not hesitate to return to the ED if you develop new, or concerning changes in your condition. ? ?Otherwise, please follow-up with your physician for appropriate ongoing care. ? ?

## 2016-10-03 DIAGNOSIS — F332 Major depressive disorder, recurrent severe without psychotic features: Secondary | ICD-10-CM | POA: Insufficient documentation

## 2016-10-12 ENCOUNTER — Ambulatory Visit (INDEPENDENT_AMBULATORY_CARE_PROVIDER_SITE_OTHER): Payer: Managed Care, Other (non HMO) | Admitting: *Deleted

## 2016-10-12 DIAGNOSIS — J4540 Moderate persistent asthma, uncomplicated: Secondary | ICD-10-CM

## 2016-10-12 DIAGNOSIS — L501 Idiopathic urticaria: Secondary | ICD-10-CM

## 2016-10-18 ENCOUNTER — Ambulatory Visit: Payer: Managed Care, Other (non HMO) | Admitting: Allergy & Immunology

## 2016-10-18 DIAGNOSIS — J309 Allergic rhinitis, unspecified: Secondary | ICD-10-CM

## 2016-10-25 ENCOUNTER — Ambulatory Visit: Payer: Managed Care, Other (non HMO) | Admitting: Allergy & Immunology

## 2016-11-13 ENCOUNTER — Ambulatory Visit (INDEPENDENT_AMBULATORY_CARE_PROVIDER_SITE_OTHER): Payer: Managed Care, Other (non HMO) | Admitting: *Deleted

## 2016-11-13 DIAGNOSIS — L501 Idiopathic urticaria: Secondary | ICD-10-CM

## 2016-12-07 ENCOUNTER — Emergency Department (HOSPITAL_COMMUNITY): Payer: Managed Care, Other (non HMO)

## 2016-12-07 ENCOUNTER — Encounter (HOSPITAL_COMMUNITY): Payer: Self-pay | Admitting: Emergency Medicine

## 2016-12-07 ENCOUNTER — Emergency Department (HOSPITAL_COMMUNITY)
Admission: EM | Admit: 2016-12-07 | Discharge: 2016-12-07 | Disposition: A | Discharge status: Home / Self Care | Payer: Managed Care, Other (non HMO) | Attending: Emergency Medicine | Admitting: Emergency Medicine

## 2016-12-07 DIAGNOSIS — R350 Frequency of micturition: Secondary | ICD-10-CM | POA: Insufficient documentation

## 2016-12-07 DIAGNOSIS — R102 Pelvic and perineal pain: Secondary | ICD-10-CM | POA: Diagnosis not present

## 2016-12-07 DIAGNOSIS — J45909 Unspecified asthma, uncomplicated: Secondary | ICD-10-CM | POA: Insufficient documentation

## 2016-12-07 LAB — URINALYSIS, ROUTINE W REFLEX MICROSCOPIC
Bilirubin Urine: NEGATIVE
Glucose, UA: NEGATIVE mg/dL
Ketones, ur: NEGATIVE mg/dL
Leukocytes, UA: NEGATIVE
Nitrite: NEGATIVE
Protein, ur: NEGATIVE mg/dL
Specific Gravity, Urine: 1.016 (ref 1.005–1.030)
pH: 5 (ref 5.0–8.0)

## 2016-12-07 LAB — POC URINE PREG, ED: Preg Test, Ur: NEGATIVE

## 2016-12-07 MED ORDER — IBUPROFEN 800 MG PO TABS: 800.0000 mg | ORAL_TABLET | Freq: Three times a day (TID) | ORAL | 0 refills | Status: DC | PRN

## 2016-12-07 MED ORDER — CEFTRIAXONE SODIUM 250 MG IJ SOLR
250.0000 mg | Freq: Once | INTRAMUSCULAR | Status: AC
  Administered 2016-12-07: 250 mg via INTRAMUSCULAR
  Filled 2016-12-07: qty 250

## 2016-12-07 MED ORDER — DOXYCYCLINE HYCLATE 100 MG PO CAPS: 100.0000 mg | ORAL_CAPSULE | Freq: Two times a day (BID) | ORAL | 0 refills | Status: DC

## 2016-12-07 MED ORDER — LIDOCAINE HCL 1 % IJ SOLN
INTRAMUSCULAR | Status: AC
  Administered 2016-12-07: 0.9 mL
  Filled 2016-12-07: qty 20

## 2016-12-07 NOTE — ED Triage Notes (Signed)
Pt reports a feeling of fullness in her bladder for the past few months. Has to push on bladder to help urinate. Has been to OB/GYN for this, who tested for UTI which was negative. Tried to get in with urology, but could not get appointment for a month.

## 2016-12-07 NOTE — Discharge Instructions (Signed)
Return here as needed.  Follow-up with your urologist. °

## 2016-12-07 NOTE — ED Provider Notes (Signed)
[facility]-EMERGENCY DEPT Provider Note   CSN: [id number] Arrival date & time: 12/07/16  1517     History   Chief Complaint Chief Complaint  Patient presents with  . urinary fullness    HPI Danielle Velez is a 26 y.o. female.  HPI j   Patient presents to the emergency department with lower pelvic pain and urinary discomfort and frequency.  The patient states that she has been having problems with urination over the last few months.  She states that she did see her GYN doctor who performed a pelvic exam and other testing around one month ago.  The patient states that she did not take any medications prior to arrival for her symptoms.  Patient states that her doctor did not do any imaging of her pelvis or abdomen. The patient denies chest pain, shortness of breath, headache,blurred vision, neck pain, fever, cough, weakness, numbness, dizziness, anorexia, edema,nausea, vomiting, diarrhea, rash, back pain, dysuria, hematemesis, bloody stool, near syncope, or syncope. Past Medical History:  Diagnosis Date  . Angio-edema   . Asthma   . Recurrent upper respiratory infection (URI)   . Urticaria     Patient Active Problem List   Diagnosis Date Noted  . Recurrent pleurisy 07/19/2016  . Moderate persistent asthma 03/22/2015  . Food allergy 03/22/2015  . Asthma with acute exacerbation 03/09/2015  . Idiopathic urticaria 03/09/2015  . Allergic rhinitis 03/09/2015    Past Surgical History:  Procedure Laterality Date  . CESAREAN SECTION    . CESAREAN SECTION  09/16/2010    OB History    No data available       Home Medications    Prior to Admission medications   Medication Sig Start Date End Date Taking? Authorizing Provider  ibuprofen (ADVIL,MOTRIN) 200 MG tablet Take 400 mg by mouth every 6 (six) hours as needed for moderate pain.   Yes [provider]  albuterol (PROVENTIL HFA;VENTOLIN HFA) 108 (90 Base) MCG/ACT inhaler Inhale 4 puffs into the lungs every 4 (four) hours as  needed for wheezing or shortness of breath (cough). 05/23/16   Alfonse SpruceGallagher, Joel Louis, MD  budesonide-formoterol [facility](SYMBICORT) 160-4.5 MCG/ACT inhaler Inhale 2 puffs into the lungs 2 (two) times daily. Patient is out of this medication Patient not taking: Reported on 12/07/2016 04/19/16   Alfonse SpruceGallagher, Joel Louis, MD  EPINEPHrine (EPIPEN 2-PAK) 0.3 mg/0.3 mL IJ SOAJ injection Inject 0.3 mLs (0.3 mg total) into the muscle once. 03/17/15   Bobbitt, Heywood Ilesalph Carter, MD  fluticasone (FLONASE) 50 MCG/ACT nasal spray Place 2 sprays into both nostrils daily. Patient not taking: Reported on 09/30/2016 03/22/15   Bobbitt, Heywood Ilesalph Carter, MD  levocetirizine (XYZAL) 5 MG tablet Take 1 tablet (5 mg total) by mouth every evening. Patient not taking: Reported on 09/30/2016 03/22/15   Bobbitt, Heywood Ilesalph Carter, MD  montelukast (SINGULAIR) 10 MG tablet Take 1 tablet (10 mg total) by mouth at bedtime. Patient not taking: Reported on 09/30/2016 04/19/16   Alfonse SpruceGallagher, Joel Louis, MD  omeprazole (PRILOSEC) 20 MG capsule Take 1 capsule (20 mg total) by mouth daily. Take one tablet daily Patient not taking: Reported on 12/07/2016 09/30/16   Gerhard MunchLockwood, Robert, MD  XOLAIR 150 MG injection Inject 150 mg into the skin every 28 (twenty-eight) days.  12/05/16   [provider]    Family History Family History  Problem Relation Age of Onset  . Asthma Maternal Grandmother   . Allergic rhinitis Neg Hx   . Angioedema Neg Hx   . Atopy Neg Hx   .  Eczema Neg Hx   . Immunodeficiency Neg Hx   . Urticaria Neg Hx     Social History Social History  Substance Use Topics  . Smoking status: Never Smoker  . Smokeless tobacco: Never Used  . Alcohol use No     Allergies   Effexor [venlafaxine]; Other; and Shellfish allergy   Review of Systems Review of Systems All other systems negative except as documented in the HPI. All pertinent positives and negatives as reviewed in the HPI.  Physical Exam Updated Vital Signs BP (!) 85/65 (BP  Location: Right Arm)   Pulse (!) 52   Temp 98.3 F (36.8 C) (Oral)   Resp 12   SpO2 100%   Physical Exam  Constitutional: She is oriented to person, place, and time. She appears well-developed and well-nourished. No distress.  HENT:  Head: Normocephalic and atraumatic.  Mouth/Throat: Oropharynx is clear and moist.  Eyes: Pupils are equal, round, and reactive to light.  Neck: Normal range of motion. Neck supple.  Cardiovascular: Normal rate, regular rhythm and normal heart sounds.  Exam reveals no gallop and no friction rub.   No murmur heard. Pulmonary/Chest: Effort normal and breath sounds normal. No respiratory distress. She has no wheezes.  Abdominal: Soft. Normal appearance and bowel sounds are normal. She exhibits no distension and no mass. There is tenderness. There is no rebound and no guarding.    Neurological: She is alert and oriented to person, place, and time. She exhibits normal muscle tone. Coordination normal.  Skin: Skin is warm and dry. Capillary refill takes less than 2 seconds. No rash noted. No erythema.  Psychiatric: She has a normal mood and affect. Her behavior is normal.  Nursing note and vitals reviewed.    ED Treatments / Results  Labs (all labs ordered are listed, but only abnormal results are displayed) Labs Reviewed  URINALYSIS, ROUTINE W REFLEX MICROSCOPIC - Abnormal; Notable for the following:       Result Value   Hgb urine dipstick MODERATE (*)    Bacteria, UA FEW (*)    Squamous Epithelial / LPF 0-5 (*)    All other components within normal limits  POC URINE PREG, ED    EKG  EKG Interpretation None       Radiology [place] Transvaginal Non-ob  Result Date: 12/07/2016 CLINICAL DATA:  Pelvic pain for 3 months EXAM: TRANSABDOMINAL AND TRANSVAGINAL ULTRASOUND OF PELVIS DOPPLER ULTRASOUND OF OVARIES TECHNIQUE: Both transabdominal and transvaginal ultrasound examinations of the pelvis were performed. Transabdominal technique was performed for  global imaging of the pelvis including uterus, ovaries, adnexal regions, and pelvic cul-de-sac. It was necessary to proceed with endovaginal exam following the transabdominal exam to visualize the endometrium uterus and ovaries. Color and duplex Doppler ultrasound was utilized to evaluate blood flow to the ovaries. COMPARISON:  None. FINDINGS: Uterus Measurements: 7.7 x 3.1 x 4.7 cm. Small cystic in the lower uterine segment/ upper cervix measuring 0.7 cm, possible cystic fibroid or nabothian cyst. Endometrium Thickness: 4.7 mm.  No focal abnormality visualized. Right ovary Measurements: 4.7 x 2.4 x 2.9 cm. Normal appearance/no adnexal mass. Left ovary Measurements: 4 x 1.7 x 3.5 cm. Normal appearance/no adnexal mass. Pulsed Doppler evaluation of both ovaries demonstrates normal low-resistance arterial and venous waveforms. Other findings No abnormal free fluid. IMPRESSION: 1. Negative for ovarian torsion 2. No significant abnormalities are seen. Electronically Signed   By: Jasmine PangKim  Fujinaga M.D.   On: 12/07/2016 18:27   [place] Pelvis Complete  Result Date: 12/07/2016  CLINICAL DATA:  Pelvic pain for 3 months EXAM: TRANSABDOMINAL AND TRANSVAGINAL ULTRASOUND OF PELVIS DOPPLER ULTRASOUND OF OVARIES TECHNIQUE: Both transabdominal and transvaginal ultrasound examinations of the pelvis were performed. Transabdominal technique was performed for global imaging of the pelvis including uterus, ovaries, adnexal regions, and pelvic cul-de-sac. It was necessary to proceed with endovaginal exam following the transabdominal exam to visualize the endometrium uterus and ovaries. Color and duplex Doppler ultrasound was utilized to evaluate blood flow to the ovaries. COMPARISON:  None. FINDINGS: Uterus Measurements: 7.7 x 3.1 x 4.7 cm. Small cystic in the lower uterine segment/ upper cervix measuring 0.7 cm, possible cystic fibroid or nabothian cyst. Endometrium Thickness: 4.7 mm.  No focal abnormality visualized. Right ovary  Measurements: 4.7 x 2.4 x 2.9 cm. Normal appearance/no adnexal mass. Left ovary Measurements: 4 x 1.7 x 3.5 cm. Normal appearance/no adnexal mass. Pulsed Doppler evaluation of both ovaries demonstrates normal low-resistance arterial and venous waveforms. Other findings No abnormal free fluid. IMPRESSION: 1. Negative for ovarian torsion 2. No significant abnormalities are seen. Electronically Signed   By: Jasmine Pang M.D.   On: 12/07/2016 18:27   [place] Art/ven Flow Abd Pelv Doppler  Result Date: 12/07/2016 CLINICAL DATA:  Pelvic pain for 3 months EXAM: TRANSABDOMINAL AND TRANSVAGINAL ULTRASOUND OF PELVIS DOPPLER ULTRASOUND OF OVARIES TECHNIQUE: Both transabdominal and transvaginal ultrasound examinations of the pelvis were performed. Transabdominal technique was performed for global imaging of the pelvis including uterus, ovaries, adnexal regions, and pelvic cul-de-sac. It was necessary to proceed with endovaginal exam following the transabdominal exam to visualize the endometrium uterus and ovaries. Color and duplex Doppler ultrasound was utilized to evaluate blood flow to the ovaries. COMPARISON:  None. FINDINGS: Uterus Measurements: 7.7 x 3.1 x 4.7 cm. Small cystic in the lower uterine segment/ upper cervix measuring 0.7 cm, possible cystic fibroid or nabothian cyst. Endometrium Thickness: 4.7 mm.  No focal abnormality visualized. Right ovary Measurements: 4.7 x 2.4 x 2.9 cm. Normal appearance/no adnexal mass. Left ovary Measurements: 4 x 1.7 x 3.5 cm. Normal appearance/no adnexal mass. Pulsed Doppler evaluation of both ovaries demonstrates normal low-resistance arterial and venous waveforms. Other findings No abnormal free fluid. IMPRESSION: 1. Negative for ovarian torsion 2. No significant abnormalities are seen. Electronically Signed   By: Jasmine Pang M.D.   On: 12/07/2016 18:27    Procedures Procedures (including critical care time)  Medications Ordered in ED Medications - No data to  display   Initial Impression / Assessment and Plan / ED Course  I have reviewed the triage vital signs and the nursing notes.  Pertinent labs & imaging results that were available during my care of the patient were reviewed by me and considered in my medical decision making (see chart for details).     Patient be treated for possible low level PID also have her follow-up with urology as she had planned to do in August.  The patient will be advised to return here for any worsening in her condition.  Patient agrees the plan and all questions were answered Final Clinical Impressions(s) / ED Diagnoses   Final diagnoses:  Pelvic pain    New Prescriptions New Prescriptions   No medications on file     Kyra Manges 12/07/16 1846    Tilden Fossa, MD 12/09/16 [phone]

## 2016-12-11 ENCOUNTER — Ambulatory Visit: Payer: Managed Care, Other (non HMO)

## 2016-12-25 ENCOUNTER — Ambulatory Visit: Payer: Self-pay

## 2017-01-21 ENCOUNTER — Ambulatory Visit (INDEPENDENT_AMBULATORY_CARE_PROVIDER_SITE_OTHER): Payer: Managed Care, Other (non HMO)

## 2017-01-21 DIAGNOSIS — L501 Idiopathic urticaria: Secondary | ICD-10-CM | POA: Diagnosis not present

## 2017-01-21 DIAGNOSIS — J4540 Moderate persistent asthma, uncomplicated: Secondary | ICD-10-CM

## 2017-02-21 ENCOUNTER — Telehealth: Payer: Self-pay

## 2017-02-21 ENCOUNTER — Ambulatory Visit: Payer: Self-pay

## 2017-02-21 NOTE — Telephone Encounter (Signed)
Tried to contact patient to inform her that the office is closing early due to the weather but her number was not in service.

## 2017-05-02 ENCOUNTER — Ambulatory Visit (INDEPENDENT_AMBULATORY_CARE_PROVIDER_SITE_OTHER): Payer: Managed Care, Other (non HMO) | Admitting: *Deleted

## 2017-05-02 DIAGNOSIS — L501 Idiopathic urticaria: Secondary | ICD-10-CM | POA: Diagnosis not present

## 2017-05-30 ENCOUNTER — Ambulatory Visit: Payer: Managed Care, Other (non HMO)

## 2017-06-07 ENCOUNTER — Ambulatory Visit (INDEPENDENT_AMBULATORY_CARE_PROVIDER_SITE_OTHER): Payer: Managed Care, Other (non HMO) | Admitting: Obstetrics & Gynecology

## 2017-06-07 ENCOUNTER — Ambulatory Visit (INDEPENDENT_AMBULATORY_CARE_PROVIDER_SITE_OTHER): Payer: Managed Care, Other (non HMO)

## 2017-06-07 ENCOUNTER — Encounter: Payer: Self-pay | Admitting: Obstetrics & Gynecology

## 2017-06-07 VITALS — BP 119/75 | HR 65 | Wt 163.0 lb

## 2017-06-07 DIAGNOSIS — N939 Abnormal uterine and vaginal bleeding, unspecified: Secondary | ICD-10-CM

## 2017-06-07 DIAGNOSIS — L501 Idiopathic urticaria: Secondary | ICD-10-CM

## 2017-06-07 DIAGNOSIS — Z113 Encounter for screening for infections with a predominantly sexual mode of transmission: Secondary | ICD-10-CM

## 2017-06-07 DIAGNOSIS — Z124 Encounter for screening for malignant neoplasm of cervix: Secondary | ICD-10-CM | POA: Diagnosis not present

## 2017-06-07 DIAGNOSIS — Z1151 Encounter for screening for human papillomavirus (HPV): Secondary | ICD-10-CM | POA: Diagnosis not present

## 2017-06-07 DIAGNOSIS — N898 Other specified noninflammatory disorders of vagina: Secondary | ICD-10-CM

## 2017-06-07 NOTE — Progress Notes (Signed)
History:  27 y.o. pt is a P1. She presents today for eval of a 'spot' seen on her cervix. It was noted at a visit from urgent care when she went to get eval for BV. She reports that she now has itching and a discharge with no odor.  Pt lst had a PAP 3 years ago. She would like a PAP at this visit as well.  She has not been sexually active since Dec. Had cx done at the urgent care and they were neg per pt.      The following portions of the patient's history were reviewed and updated as appropriate: allergies, current medications, past family history, past medical history, past social history, past surgical history and problem list.  Review of Systems:  Pertinent items are noted in HPI.   Objective:  Physical Exam Blood pressure 119/75, pulse 65, weight 163 lb (73.9 kg), last menstrual period 05/14/2017.  CONSTITUTIONAL: Well-developed, well-nourished female in no acute distress.  HENT:  Normocephalic, atraumatic EYES: Conjunctivae and EOM are normal. No scleral icterus.  NECK: Normal range of motion SKIN: Skin is warm and dry. No rash noted. Not diaphoretic.No pallor. [place]: Alert and oriented to person, place, and time. Normal coordination.  Abd: Soft, nontender and nondistended Pelvic: Normal appearing external genitalia; normal appearing vaginal mucosa and cervix.  Normal discharge.  Small uterus, no other palpable masses, no uterine or adnexal tenderness. PAP obtained.   Labs and Imaging No results found.  Assessment & Plan:  Spot on cervix- Nabothian cyst.  Benign. No furhter tx indicated.  Pap  Done today  Leukorrhea   Wet mount done today  Adriell Polansky L. Harraway-Smith, M.D., Cherlynn June

## 2017-06-07 NOTE — Progress Notes (Signed)
Pt stated went to urgent care 3 weeks ago bacteria infection and they found white patches in cervix and cramping Left side.

## 2017-06-10 LAB — CERVICOVAGINAL ANCILLARY ONLY
Bacterial vaginitis: POSITIVE — AB
Candida vaginitis: NEGATIVE
Trichomonas: NEGATIVE

## 2017-06-11 ENCOUNTER — Other Ambulatory Visit: Payer: Self-pay | Admitting: Obstetrics & Gynecology

## 2017-06-11 DIAGNOSIS — B9689 Other specified bacterial agents as the cause of diseases classified elsewhere: Secondary | ICD-10-CM

## 2017-06-11 DIAGNOSIS — N760 Acute vaginitis: Principal | ICD-10-CM

## 2017-06-11 LAB — CYTOLOGY - PAP
Diagnosis: NEGATIVE
HPV: NOT DETECTED

## 2017-06-11 MED ORDER — METRONIDAZOLE 500 MG PO TABS: 500.0000 mg | ORAL_TABLET | Freq: Two times a day (BID) | ORAL | 0 refills | Status: DC

## 2017-07-15 ENCOUNTER — Other Ambulatory Visit: Payer: Self-pay | Admitting: Allergy & Immunology

## 2017-07-18 ENCOUNTER — Ambulatory Visit (INDEPENDENT_AMBULATORY_CARE_PROVIDER_SITE_OTHER): Payer: Managed Care, Other (non HMO) | Admitting: Allergy & Immunology

## 2017-07-18 ENCOUNTER — Encounter: Payer: Self-pay | Admitting: Allergy & Immunology

## 2017-07-18 VITALS — BP 100/72 | HR 60 | Resp 18

## 2017-07-18 DIAGNOSIS — R091 Pleurisy: Secondary | ICD-10-CM

## 2017-07-18 DIAGNOSIS — T781XXD Other adverse food reactions, not elsewhere classified, subsequent encounter: Secondary | ICD-10-CM

## 2017-07-18 DIAGNOSIS — L501 Idiopathic urticaria: Secondary | ICD-10-CM

## 2017-07-18 DIAGNOSIS — J4540 Moderate persistent asthma, uncomplicated: Secondary | ICD-10-CM

## 2017-07-18 NOTE — Patient Instructions (Addendum)
1. Pleurisy - recurrent - Stable without worsening course.  - Continue with ibuprofen as needed.   2. Moderate persistent asthma with current episode of pleurisy - Lung function was normal today, and I think you will do better with the Symbicort restarted.  - Sample of Symbicort provided.  - Daily controller medication(s): Symbicort 160/4.5 two puffs twice daily with spacer - Rescue medications: ProAir 4 puffs every 4-6 hours as needed or albuterol nebulizer one vial puffs every 4-6 hours as needed - Changes during respiratory infections or worsening symptoms: add Asmanex 100mcg with spacer two puffs twice daily for two weeks (sample provided) - Asthma control goals:  * Full participation in all desired activities (may need albuterol before activity) * Albuterol use two time or less a week on average (not counting use with activity) * Cough interfering with sleep two time or less a month * Oral steroids no more than once a year * No hospitalizations  3. Allergic rhinitis - Continue with Zyrtec (cetirizine) 10mg  daily. - We can consider allergy shots once you have health insurance once again.  4. Food allergies - Continue to avoid shellfish, milk products (intolerance only), and wheat products. - EpiPen is up to date.   5. Urticaria (hives) - We will restart the Xolair today.  - The daily cetirizine (Zyrtec) will help with your symptoms.    6. Return in about 6 months (around 01/18/2018).   Please inform us of any Emergency Department visits, hospitalizations, or changes in symptoms. Call us before going to the ED for breathing or allergy symptoms since we might be able to fit you in for a sick visit. Feel free to contact us anytime with any questions, problems, or concerns.  It was a pleasure to see you again today! Good luck with the job hunt!   Websites that have reliable patient information: 1. American Academy of Asthma, Allergy, and Immunology: www.aaaai.org 2. Food Allergy  Research and Education (FARE): foodallergy.org 3. Mothers of Asthmatics: http://www.asthmacommunitynetwork.org 4. American College of Allergy, Asthma, and Immunology: www.acaai.org

## 2017-07-18 NOTE — Progress Notes (Signed)
FOLLOW UP  Date of Service/Encounter:  07/18/17   Assessment:   Moderate persistent asthma without complication  Chronic idiopathic urticaria  Allergic rhinitis (grasses, weeds, ragweed, trees, mold, cat, dog, dust mite, and cockroach)  Recurrent pleurisy  Adverse food reaction   Asthma Reportables:  Severity: moderate persistent  Risk: high due to recurrent steroid bursts Control: well controlled   Plan/Recommendations:   1. Pleurisy - recurrent - Stable without worsening course.  - Continue with ibuprofen as needed.  - Thankfully we have been able to avoid systemic prednisone.   2. Moderate persistent asthma with current episode of pleurisy - Lung function was normal today, and I think you will do better with the Symbicort restarted.  - Sample of Symbicort provided.  - Daily controller medication(s): Symbicort 160/4.5 two puffs twice daily with spacer - Rescue medications: ProAir 4 puffs every 4-6 hours as needed or albuterol nebulizer one vial puffs every 4-6 hours as needed - Changes during respiratory infections or worsening symptoms: add Asmanex with spacer two puffs twice daily for two weeks (sample provided) - Asthma control goals:  * Full participation in all desired activities (may need albuterol before activity) * Albuterol use two time or less a week on average (not counting use with activity) * Cough interfering with sleep two time or less a month * Oral steroids no more than once a year * No hospitalizations  3. Allergic rhinitis - Continue with Zyrtec (cetirizine) 10mg  daily. - We can consider allergy shots once you have health insurance once again.  4. Food allergies - Continue to avoid shellfish, milk products (intolerance only), and wheat products. - EpiPen is up to date.   5. Urticaria (hives) - We will restart the Xolair today.  - The daily cetirizine (Zyrtec) will help with your symptoms.    6. Return in about 6 months  (around 01/18/2018).  Subjective:   Danielle Velez is a 27 y.o. female presenting today for follow up of  Chief Complaint  Patient presents with  . Asthma    since winter started not doing very well. chest has been tight all morning.     Danielle Velez has a history of the following: Patient Active Problem List   Diagnosis Date Noted  . Recurrent pleurisy 07/19/2016  . Moderate persistent asthma 03/22/2015  . Food allergy 03/22/2015  . Asthma with acute exacerbation 03/09/2015  . Allergic rhinitis 03/09/2015    History obtained from: chart review and patient.  [facility]'s Primary Care Provider is Patient, No Pcp Per.     Danielle Velez is a 27 y.o. female presenting for a follow up visit. She was last seen in March 2018 in an office visit, although she was seen for Xolair injections as recently as January 2019. 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 At the last visit, we continued her on Symbicort 160/4.5 two puffs BID as well as ProAir as needed with Asmanex added during respiratory flares.  Although I felt that she would do well on allergy shots, we continued her on fluticasone nasal spray and cetirizine.   Since the last visit, she has mostly done well. She did get on Xolair (started at her last visit), but only seemed to be getting it every two months or so. Her last injection was in January 2019.  She has since left her job with [employer] due to changes in management. She  has been out of a job for three weeks. She does have two interviews next week. She is trying to find the correct fit and is actually thinking of getting her real estate license and obtaining a marketing degree. She seems to have a good head on her shoulders.   Asthma/Respiratory Symptom History: Asthma remains uncontrolled as of late due to weather changes. She has lost a lot of weight  over the last 6-12 months. She typically takes her Symbicort and does fine with that, especially with working out. She has bene out for the past three days and it has been difficult since that happened. She has been having to take deeper breaths and has had some increased tightness. She is out of her rescue inhaler as well and needs refills on that. She has not needed prednisone since the last visit.   Urticaria Symptom History: She has been doing well on the Xolair, but is having some breakthrough now since she has been off of the Xolair for so long. Overall this seems to have improved with avoidance of the cow's milk. She is also working on avoiding wheat.   Allergic Rhinitis Symptom History: She is currently using the cetirizine as needed. She is not on any nasal sprays at this time. We had discussed allergy shots in the past, but she is clearly in no position to pursue this current.   Food Allergy Symptom History: She continues to avoid shellfish, milk, and wheat. There have been no accidental ingestions. EpiPen is up to date. Testing has only ever been positive to shellfish in the past, with the wheat and milk essentially being intolerances; however she sometimes notices urticarial flares with milk and wheat exposures. The history has never been very consistent.    She continues to have episodes of pleurisy which she treated with ibuprofen. She had episdoes around 2-3 times per month. Pleurisy was first diagnosed in [place]. 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 Last seizure that she had was years ago in 2009. She has only ever had three for her entire life. She has tried ibuprofen with occasional improvement. Pleurisy is always on the left side. It is not always associated with an asthma exacerbation. She has not gotten the labs we sent for autoimmune etiologies at this time but is planning on going today. Interestingly, she also has a history of a blood clot  diagnosed incidentally when she was in her teenage years. She still has not obtained the ANA and inflammatory markers which were ordered in December 2017.   Otherwise, there have been no changes to her past medical history, surgical history, family history, or social history.    Review of Systems: a 14-point review of systems is pertinent for what is mentioned in HPI.  Otherwise, all other systems were negative. Constitutional: negative other than that listed in the HPI Eyes: negative other than that listed in the HPI Ears, nose, mouth, throat, and face: negative other than that listed in the HPI Respiratory: negative other than that listed in the HPI Cardiovascular: negative other than that listed in the HPI Gastrointestinal: negative other than that listed in the HPI Genitourinary: negative other than that listed in the HPI Integument: negative other than that listed in the HPI Hematologic: negative other than that listed in the HPI Musculoskeletal: negative other than that listed in the HPI Neurological: negative other than that listed in the HPI Allergy/Immunologic: negative other than that listed in the HPI  Objective:   Blood pressure 100/72, pulse 60, resp. rate 18, SpO2 98 %. There is no height or weight on file to calculate BMI.   Physical Exam:  General: Alert, interactive, in no acute distress. Pleasant female.  Eyes: No conjunctival injection bilaterally, no discharge on the right, no discharge on the left and no Horner-Trantas dots present. PERRL bilaterally. EOMI without pain. No photophobia.  Ears: Right TM pearly gray with normal light reflex, Left TM pearly gray with normal light reflex, Right TM intact without perforation and Left TM intact without perforation.  Nose/Throat: External nose within normal limits and septum midline. Turbinates edematous and pale with clear discharge. Posterior oropharynx mildly erythematous without cobblestoning in the posterior  oropharynx. Tonsils 2+ without exudates.  Tongue without thrush. Lungs: Decreased breath sounds bilaterally without wheezing, rhonchi or rales. No increased work of breathing. CV: Normal S1/S2. No murmurs. Capillary refill <2 seconds.  Skin: Warm and dry, without lesions or rashes. Neuro:   Grossly intact. No focal deficits appreciated. Responsive to questions.  Diagnostic studies:   Spirometry: results normal (FEV1: 1%, FVC: 1.73/71%, FEV1/FVC: 2.45/88%).    Spirometry consistent with normal pattern.   Allergy Studies: none     Malachi BondsJoel Dayle Sherpa, MD [facility]

## 2017-10-02 ENCOUNTER — Ambulatory Visit (INDEPENDENT_AMBULATORY_CARE_PROVIDER_SITE_OTHER): Payer: Self-pay | Admitting: Allergy

## 2017-10-02 ENCOUNTER — Encounter: Payer: Self-pay | Admitting: Allergy

## 2017-10-02 VITALS — BP 102/66 | HR 70 | Resp 18

## 2017-10-02 DIAGNOSIS — M898X9 Other specified disorders of bone, unspecified site: Secondary | ICD-10-CM

## 2017-10-02 DIAGNOSIS — J4540 Moderate persistent asthma, uncomplicated: Secondary | ICD-10-CM

## 2017-10-02 DIAGNOSIS — J3089 Other allergic rhinitis: Secondary | ICD-10-CM

## 2017-10-02 DIAGNOSIS — T781XXD Other adverse food reactions, not elsewhere classified, subsequent encounter: Secondary | ICD-10-CM

## 2017-10-02 DIAGNOSIS — T7840XD Allergy, unspecified, subsequent encounter: Secondary | ICD-10-CM

## 2017-10-02 DIAGNOSIS — L501 Idiopathic urticaria: Secondary | ICD-10-CM

## 2017-10-02 NOTE — Patient Instructions (Addendum)
Allergic reaction     - avoid peanut products for now and continue avoidance of shellfish, wheat and dairy that you have been previously avoiding.     - provided with zyrtec and prednisone in the office.   Take  prednisone twice a day for the next 2 days.      - have access to self-injectable epinephrine (Epipen or AuviQ) 0.3mg  at all times    - at this time will need to wait about 4-6 weeks before we can repeat testing for peanut    - follow emergency action plan  Moderate persistent asthma - Daily controller medication(s): Symbicort 160/4.5 two puffs twice daily with spacer - Rescue medications: ProAir 4 puffs every 4-6 hours as needed or albuterol nebulizer one vial puffs every 4-6 hours as needed - Changes during respiratory infections or worsening symptoms: add Asmanex with spacer two puffs twice daily for two weeks (sample provided) - Asthma control goals:  * Full participation in all desired activities (may need albuterol before activity) * Albuterol use two time or less a week on average (not counting use with activity) * Cough interfering with sleep two time or less a month * Oral steroids no more than once a year * No hospitalizations  Allergic rhinitis - Continue with Zyrtec (cetirizine)  daily. - We can consider allergy shots once you have health insurance once again.   Urticaria (hives) -  restart the Xolair   - The daily cetirizine (Zyrtec) will help with your symptoms.    Bony growth on hand - recommend setting up care with internal medicine clinic to evaluate this.    Please call [phone] to set up appointment.    Return in around  (around 01/18/2018).

## 2017-10-02 NOTE — Progress Notes (Signed)
Follow-up Note  RE: Danielle Velez MRN: [id number] DOB: [birth year] Date of Office Visit: 10/02/2017   History of present illness: Danielle Velez is a 27 y.o. female presenting today for sick visit following allergic reaction this morning.  She was last seen in the office on July 18, 2017 by Dr. Dellis Anes.  The history of asthma, chronic idiopathic urticaria, allergic rhinitis, recurrent pleurisy and food allergy.  She states she normally avoids dairy, wheat and shellfish.  However she states she normally does not eat peanut products.  This morning she had a [place] peanut bar and within an hour of ingestion she noticed that her throat was itchy, she was having chest tightness and wheezing and had developed hives.  She states she took a Benadryl and she used her albuterol which helped with her respiratory symptoms and the Benadryl helped with her hives.  She still has some bit of an itchy throat at this time.  She does feel overall better.  She states that she does not have an EpiPen as she did not think she needed to continue to have access to it while she was on Xolair.  She was on Xolair for both asthma and hives but has not had an injection since March.  She states she has changed jobs and in the interim lost her insurance but plans to have insurance back with her new job in the next couple of weeks. With her asthma she takes Symbicort 2 puffs twice a day she also takes Zyrtec. She also asked me today about a bump that she has on the dorsal surface of her right hand.  She states it is not painful but is more bothersome.  Does not come and go but she states that it has gotten a little bit bigger over the past several weeks.  She currently does not have a PCP.  Review of systems: Review of Systems  Constitutional: Negative for chills, fever and malaise/fatigue.  HENT: Positive for sore throat. Negative for congestion, ear discharge, ear pain, nosebleeds and sinus pain.   Eyes: Negative for  pain, discharge and redness.  Respiratory: Positive for wheezing. Negative for cough.   Cardiovascular: Negative for chest pain.  Gastrointestinal: Negative for abdominal pain, constipation, diarrhea, heartburn, nausea and vomiting.  Musculoskeletal: Negative for joint pain.  Skin: Positive for itching and rash.  Neurological: Negative for headaches.    All other systems negative unless noted above in HPI  Past medical/social/surgical/family history have been reviewed and are unchanged unless specifically indicated below.  No changes  Medication List: Allergies as of 10/02/2017      Reactions   Effexor [venlafaxine] Other (See Comments)   Shaky    Other Hives   DAIRY and WHEAT.  REACTION HIVES AND ITCHING.   Shellfish Allergy Itching, Swelling      Medication List        Accurate as of 10/02/17  1:55 PM. Always use your most recent med list.          albuterol 108 (90 Base) MCG/ACT inhaler Commonly known as:  PROVENTIL HFA;VENTOLIN HFA Inhale 4 puffs into the lungs every 4 (four) hours as needed for wheezing or shortness of breath (cough).   budesonide-formoterol 160-4.5 MCG/ACT inhaler Commonly known as:  SYMBICORT Inhale 2 puffs into the lungs 2 (two) times daily. Patient is out of this medication   EPINEPHrine 0.3 mg/0.3 mL Soaj injection Commonly known as:  EPIPEN 2-PAK Inject 0.3 mLs (0.3 mg total) into the muscle  once.   fluticasone 50 MCG/ACT nasal spray Commonly known as:  FLONASE Place 2 sprays into both nostrils daily.   ibuprofen 800 MG tablet Commonly known as:  ADVIL,MOTRIN Take 1 tablet (800 mg total) by mouth every 8 (eight) hours as needed.   levocetirizine 5 MG tablet Commonly known as:  XYZAL Take 1 tablet (5 mg total) by mouth every evening.   metroNIDAZOLE 500 MG tablet Commonly known as:  FLAGYL Take 1 tablet (500 mg total) by mouth 2 (two) times daily.   XOLAIR 150 MG injection Generic drug:  omalizumab Inject 150 mg into the skin  every 28 (twenty-eight) days.       Known medication allergies: Allergies  Allergen Reactions  . Effexor [Venlafaxine] Other (See Comments)    Shaky   . Other Hives    DAIRY and WHEAT.  REACTION HIVES AND ITCHING.  Marland Kitchen Shellfish Allergy Itching and Swelling     Physical examination: Blood pressure 102/66, pulse 70, resp. rate 18, SpO2 96 %.  General: Alert, interactive, in no acute distress. HEENT: PERRLA, TMs pearly gray, turbinates minimally edematous without discharge, post-pharynx non erythematous.  No oropharyngeal swelling. Neck: Supple without lymphadenopathy. Lungs: Clear to auscultation without wheezing, rhonchi or rales. {no increased work of breathing. CV: Normal S1, S2 without murmurs. Abdomen: Nondistended, nontender. Skin: Warm and dry, without lesions or rashes. Extremities:  No clubbing, cyanosis or edema.  Right dorsal surface of hand near wrist with firm non-mobile, non-tender nodule Neuro:   Grossly intact.  Diagnositics/Labs:  Spirometry: FEV1: 1.85L  76%, FVC: 2.27L  81%.   FEV1 is slightly reduced for age/demographics  Assessment and plan:   Allergic reaction     - avoid peanut products for now and continue avoidance of shellfish, wheat and dairy that you have been previously avoiding.     - provided with zyrtec and prednisone in the office.   Take  prednisone twice a day for the next 2 days to prevent.      - have access to self-injectable epinephrine (Epipen or AuviQ) 0.3mg  at all times  (Auviq sample provided in clinic until she has insurance to get additional devices)    - at this time will need to wait about 4-6 weeks before we can repeat testing for peanut    - follow emergency action plan  Moderate persistent asthma  - Daily controller medication(s): Symbicort 160/4.5 two puffs twice daily with spacer - Rescue medications: ProAir 4 puffs every 4-6 hours as needed or albuterol nebulizer one vial puffs every 4-6 hours as needed - Changes  during respiratory infections or worsening symptoms: add Asmanex with spacer two puffs twice daily for two weeks (sample provided) - Asthma control goals:  * Full participation in all desired activities (may need albuterol before activity) * Albuterol use two time or less a week on average (not counting use with activity) * Cough interfering with sleep two time or less a month * Oral steroids no more than once a year * No hospitalizations  Allergic rhinitis - Continue with Zyrtec (cetirizine)  daily. - We can consider allergy shots once you have health insurance once again.   Urticaria (hives) -  restart the Xolair - The daily cetirizine (Zyrtec) will help with your symptoms.    Bony growth on hand - recommend setting up care with internal medicine clinic to evaluate this.    Please call [phone] to set up appointment.    Return in around 01/18/2018  I appreciate the  opportunity to take part in [place] care. Please do not hesitate to contact me with questions.  Sincerely,   Margo Aye, MD Allergy/Immunology [facility]

## 2017-10-03 ENCOUNTER — Telehealth: Payer: Self-pay | Admitting: *Deleted

## 2017-10-03 NOTE — Telephone Encounter (Signed)
Called patient on 10/02/17 and 10/03/17 to inform patient she could receive Xoliar sample in office for one time while she is waiting for her new insurance cards to arrive.  Tammy has been notified and patient was going to call Tammy with insurance information.  Patient did not answer phone when called and voice mail is not set up to leave message.  Dr. Delorse Lek notified.

## 2017-11-10 ENCOUNTER — Other Ambulatory Visit: Payer: Self-pay | Admitting: Allergy & Immunology

## 2017-11-11 ENCOUNTER — Encounter: Payer: Self-pay | Admitting: Family Medicine

## 2017-11-11 ENCOUNTER — Other Ambulatory Visit: Payer: Self-pay

## 2017-11-11 ENCOUNTER — Ambulatory Visit (INDEPENDENT_AMBULATORY_CARE_PROVIDER_SITE_OTHER): Payer: 59 | Admitting: Family Medicine

## 2017-11-11 VITALS — BP 122/70 | HR 73 | Resp 19

## 2017-11-11 DIAGNOSIS — Z3200 Encounter for pregnancy test, result unknown: Secondary | ICD-10-CM | POA: Insufficient documentation

## 2017-11-11 DIAGNOSIS — J4541 Moderate persistent asthma with (acute) exacerbation: Secondary | ICD-10-CM | POA: Diagnosis not present

## 2017-11-11 DIAGNOSIS — R071 Chest pain on breathing: Secondary | ICD-10-CM | POA: Diagnosis not present

## 2017-11-11 DIAGNOSIS — R091 Pleurisy: Secondary | ICD-10-CM

## 2017-11-11 DIAGNOSIS — T781XXD Other adverse food reactions, not elsewhere classified, subsequent encounter: Secondary | ICD-10-CM | POA: Diagnosis not present

## 2017-11-11 DIAGNOSIS — J3089 Other allergic rhinitis: Secondary | ICD-10-CM | POA: Diagnosis not present

## 2017-11-11 DIAGNOSIS — T781XXA Other adverse food reactions, not elsewhere classified, initial encounter: Secondary | ICD-10-CM | POA: Insufficient documentation

## 2017-11-11 DIAGNOSIS — R079 Chest pain, unspecified: Secondary | ICD-10-CM | POA: Insufficient documentation

## 2017-11-11 MED ORDER — BUDESONIDE-FORMOTEROL FUMARATE 160-4.5 MCG/ACT IN AERO
INHALATION_SPRAY | RESPIRATORY_TRACT | 5 refills | Status: DC
Start: 2017-11-11 — End: 2018-03-07

## 2017-11-11 MED ORDER — EPINEPHRINE 0.3 MG/0.3ML IJ SOAJ: 0.3000 mg | Freq: Once | INTRAMUSCULAR | 2 refills | Status: AC

## 2017-11-11 MED ORDER — EPINEPHRINE 0.3 MG/0.3ML IJ SOAJ: 0.3000 mg | Freq: Once | INTRAMUSCULAR | 2 refills | Status: DC

## 2017-11-11 MED ORDER — MOMETASONE FUROATE 220 MCG/INH IN AEPB: 2.0000 | INHALATION_SPRAY | Freq: Every day | RESPIRATORY_TRACT | 12 refills | Status: DC

## 2017-11-11 NOTE — Progress Notes (Signed)
[address] [place] [address] Dept: [phone]  FOLLOW UP NOTE  Patient ID: Danielle Velez, female    DOB: June 21, 1990  Age: 27 y.o. MRN: [id number] Date of Office Visit: 11/11/2017  Assessment  Chief Complaint: Wheezing and Shortness of Breath  HPI Danielle Velez is a 27 year old female who presents to the clinic for a sick visit. She was last seen in this clinic on 10/02/2017 by Dr. Delorse Lek for evaluation of allergic reaction to food, asthma, and urticaria. At that time, she continued on her Symbicort 160, albuterol as needed, and levocetirizine. She continued to avoid shellfish, wheat, and dairy and began avoiding peanut. She did require prednisone for resolution of symptoms.   At today's visit, she reports that she has been "having trouble with my asthma for 2 weeks". She reports that on Friday night she felt dizzy when she stood from a kneeling position and she couldn't "grasp" her breath. She describes feeling unable to take a deep breath and also unable to get a breath out. She describes a pain under her left breast that is intermittent and worsens with breathing. She reports she took a pain medication and a breathing treatment on Friday with mild relief of symptoms. She reports a full feeling in her abdomen along with abdominal pain that began on Friday night which was accompanied by a feeling of nausea. She has been out of Symbicort 160 for 1-2 weeks. She is currently using the rescue inhaler 3 times a day for the last 2 weeks. She has a history of costochondritis with her last chest xray in 2016 indicating lungs were clear with no pneumothorax.  Her current medications are listed in the chart.   Drug Allergies:  Allergies  Allergen Reactions  . Effexor [Venlafaxine] Other (See Comments)    Shaky   . Other Hives    DAIRY and WHEAT.  REACTION HIVES AND ITCHING.  Marland Kitchen Shellfish Allergy Itching and Swelling    Physical Exam: BP 122/70   Pulse 73   Resp 19   SpO2 99%      Physical Exam  Constitutional: She is oriented to person, place, and time. She appears well-developed and well-nourished.  HENT:  Head: Normocephalic and atraumatic.  Right Ear: External ear normal.  Left Ear: External ear normal.  Nose: Nose normal.  Mouth/Throat: Oropharynx is clear and moist.  Eyes: Conjunctivae are normal.  Neck: Normal range of motion. Neck supple.  Cardiovascular: Normal rate, regular rhythm and normal heart sounds.  No murmur noted  Pulmonary/Chest: Effort normal and breath sounds normal.  Lungs clear to auscultation.  Musculoskeletal: Normal range of motion.  Neurological: She is alert and oriented to person, place, and time.  Skin: Skin is warm and dry.  Psychiatric: She has a normal mood and affect. Her behavior is normal. Judgment and thought content normal.    Diagnostics: FVC 2.51, FEV1 1.99. Predicted FVC 2.82, predicted FEV1 2.46. Spirometry is within the normal range. Post bronchodilator therapy FVC 2.76, FEV1 2.35. Post bronchodilator therapy indicates an improvement of 10% FEV1.   Assessment and Plan: 1. Moderate persistent asthma with acute exacerbation   2. Chest pain on breathing   3. Possible pregnancy   4. Allergic rhinitis   5. Recurrent pleurisy   6. Adverse food reaction, subsequent encounter     Meds ordered this encounter  Medications  . DISCONTD: EPINEPHrine (AUVI-Q) 0.3 mg/0.3 mL IJ SOAJ injection    Sig: Inject 0.3 mLs (0.3 mg total) into the  muscle once for 1 dose.    Dispense:  2 Device    Refill:  2    [id number] (M)  . EPINEPHrine (AUVI-Q) 0.3 mg/0.3 mL IJ SOAJ injection    Sig: Inject 0.3 mLs (0.3 mg total) into the muscle once for 1 dose.    Dispense:  2 Device    Refill:  2    [id number] (M)    Patient Instructions  Moderate persistent asthma - Daily controller medication(s): Symbicort 160/4.5 two puffs twice daily with spacer - Rescue medications: ProAir 4 puffs every 4-6 hours as needed or albuterol  nebulizer one vial puffs every 4-6 hours as needed - Changes during respiratory infections or worsening symptoms: add Asmanex 100mcg with spacer two puffs twice daily for two weeks - Prednisone 10 mg tablets. Take 2 tablets twice a day for 3 days, then take 2 tablets once a day for 1 day, then take 1 tablet for 1 day, then stop - Get a chest xray for left sided chest pain. [facility] to [facility] - Asthma control goals:  * Full participation in all desired activities (may need albuterol before activity) * Albuterol use two time or less a week on average (not counting use with activity) * Cough interfering with sleep two time or less a month * Oral steroids no more than once a year * No hospitalizations  Allergic rhinitis - Continue with Zyrtec (cetirizine) 10mg  daily. - We can consider allergy shots once you have health insurance once again.   Urticaria (hives) -  restart the Xolair   - The daily cetirizine (Zyrtec) will help with your symptoms.    Food allergies     - avoid peanut products for now and continue avoidance of shellfish, wheat and dairy that you have been previously avoiding.    - have access to self-injectable epinephrine (Epipen or AuviQ) 0.3mg  at all times    - repeat testing for peanut    - follow emergency action plan  Follow up in 1 month or sooner if needed    Return in about 1 month (around 12/09/2017), or if symptoms worsen or fail to improve.    Thank you for the opportunity to care for this patient.  Please do not hesitate to contact me with questions.  Thermon LeylandAnne Ariyannah Pauling, FNP [facility] [place] [facility]

## 2017-11-11 NOTE — Patient Instructions (Addendum)
Moderate persistent asthma - Daily controller medication(s): Symbicort 160/4.5 two puffs twice daily with spacer - Rescue medications: ProAir 4 puffs every 4-6 hours as needed or albuterol nebulizer one vial puffs every 4-6 hours as needed - Changes during respiratory infections or worsening symptoms: add Asmanex 100mcg with spacer two puffs twice daily for two weeks - Prednisone 10 mg tablets. Take 2 tablets twice a day for 3 days, then take 2 tablets once a day for 1 day, then take 1 tablet for 1 day, then stop - Get a chest xray for left sided chest pain. [facility] to [facility] x[facility] - Asthma control goals:  * Full participation in all desired activities (may need albuterol before activity) * Albuterol use two time or less a week on average (not counting use with activity) * Cough interfering with sleep two time or less a month * Oral steroids no more than once a year * No hospitalizations  Allergic rhinitis - Continue with Zyrtec (cetirizine) 10mg  daily. - We can consider allergy shots once you have health insurance once again.   Urticaria (hives) -  restart the Xolair   - The daily cetirizine (Zyrtec) will help with your symptoms.    Food allergies     - avoid peanut products for now and continue avoidance of shellfish, wheat and dairy that you have been previously avoiding.    - have access to self-injectable epinephrine (Epipen or AuviQ) 0.3mg  at all times    - repeat testing for peanut    - follow emergency action plan  Follow up in 1 month or sooner if needed

## 2017-12-02 ENCOUNTER — Telehealth: Payer: Self-pay | Admitting: Allergy & Immunology

## 2017-12-02 ENCOUNTER — Ambulatory Visit
Admission: RE | Admit: 2017-12-02 | Discharge: 2017-12-02 | Disposition: A | Discharge status: Home / Self Care | Payer: Managed Care, Other (non HMO) | Source: Ambulatory Visit | Attending: Family Medicine | Admitting: Family Medicine

## 2017-12-02 MED ORDER — PREDNISONE 10 MG PO TABS: ORAL_TABLET | ORAL | 0 refills | Status: DC

## 2017-12-02 NOTE — Telephone Encounter (Signed)
Pt called and wantedto see if we would call in predisone for her pleurisy and chest hurting. [facility] [address] [phone].

## 2017-12-02 NOTE — Telephone Encounter (Signed)
Patient called back states that she is still having chest pains and would like prednisone

## 2017-12-02 NOTE — Telephone Encounter (Signed)
Any recommendations?

## 2017-12-02 NOTE — Telephone Encounter (Signed)
Prednisone has been sent in as instructed and patient is aware.

## 2017-12-02 NOTE — Telephone Encounter (Signed)
Please call in Prednisone 10 mg tablets. Take 2 tablets once a day for 4 days, then take 1 tablet on the 5th day. Please call this patient and let her know. Thank you

## 2017-12-03 ENCOUNTER — Telehealth: Payer: Self-pay | Admitting: *Deleted

## 2017-12-03 NOTE — Telephone Encounter (Signed)
-----   Message from Hetty BlendAnne M Ambs, FNP sent at 12/03/2017  8:25 AM EDT ----- Can you please call this patient and have her add warm compresses to the painful area several times a day and also add Alleve if she can tolerate it for the pain. Thank you.

## 2017-12-03 NOTE — Telephone Encounter (Signed)
Unable to leave message no voice mail set up. 

## 2017-12-04 NOTE — Telephone Encounter (Signed)
Advised patient of instructions and she asked about chest xray.  I looked that we had results and advised her normal results.

## 2017-12-11 ENCOUNTER — Ambulatory Visit: Payer: 59 | Admitting: Family Medicine

## 2017-12-18 ENCOUNTER — Ambulatory Visit (INDEPENDENT_AMBULATORY_CARE_PROVIDER_SITE_OTHER): Payer: 59 | Admitting: Nurse Practitioner

## 2017-12-18 ENCOUNTER — Encounter (INDEPENDENT_AMBULATORY_CARE_PROVIDER_SITE_OTHER): Payer: Self-pay

## 2017-12-18 VITALS — BP 88/60 | HR 58 | Wt 150.1 lb

## 2017-12-18 DIAGNOSIS — Z202 Contact with and (suspected) exposure to infections with a predominantly sexual mode of transmission: Secondary | ICD-10-CM

## 2017-12-18 DIAGNOSIS — Z113 Encounter for screening for infections with a predominantly sexual mode of transmission: Secondary | ICD-10-CM | POA: Diagnosis not present

## 2017-12-18 DIAGNOSIS — N898 Other specified noninflammatory disorders of vagina: Secondary | ICD-10-CM

## 2017-12-18 DIAGNOSIS — K611 Rectal abscess: Secondary | ICD-10-CM

## 2017-12-18 NOTE — Progress Notes (Signed)
   GYNECOLOGY OFFICE VISIT NOTE   History:  27 y.o. No obstetric history on file. here today for vaginal itching - internal. She denies any abnormal bleeding or pelvic pain.  She thinks it may be a yeast infection or BV.  She does not douche.  She also has a tender area near her rectum that started as itchy and then became very tender.  No drainage.  No fever.    Past Medical History:  Diagnosis Date  . Angio-edema   . Asthma   . Recurrent upper respiratory infection (URI)   . Urticaria     Past Surgical History:  Procedure Laterality Date  . CESAREAN SECTION    . CESAREAN SECTION  09/16/2010    The following portions of the patient's history were reviewed and updated as appropriate: allergies, current medications, past family history, past medical history, past social history, past surgical history and problem list.   Health Maintenance:  Normal pap and negative HRHPV on 06-07-17.   Review of Systems:  Pertinent items noted in HPI and remainder of comprehensive ROS otherwise negative.  Objective:  Physical Exam BP (!) 88/60   Pulse (!) 58   Wt 150 lb 1.6 oz (68.1 kg)   BMI 29.31 kg/m  ABDOMEN: Soft, no distention noted.   PELVIC: Normal appearing external genitalia; normal appearing vaginal mucosa and cervix.  No abnormal discharge noted.  Normal uterine size, no other palpable masses, no uterine or adnexal tenderness.  There is a small amount of liquid white vaginal discharge that pools in the vagina. No strawberry appearance to cervix. Rectum - there is a skin change noted on the side of the rectum.  No scab, no open area, no ulcer, but a very tender firm area palpated.     Assessment & Plan:  1. Vaginal discharge Labs pending  2. STD exposure Labs pending - also did RPR and HIV given there is a undiagnosed rectal problem and wanting to rule out syphilis and HIV  3.  Perirectal absess suspected - advised to see PCP or Urgent Care for further evaluation and  treatment.  Routine preventative health maintenance measures emphasized. Please refer to After Visit Summary for other counseling recommendations.   No follow-ups on file.   Total face-to-face time with patient: 15 minutes.  Over 50% of encounter was spent on counseling and coordination of care.  Nolene BernheimERRI Mahiya Kercheval, RN, MSN, NP-BC Nurse Practitioner, [facility], [facility] 12/18/2017 5:24 PM

## 2017-12-18 NOTE — Patient Instructions (Signed)
Pilonidal Cyst A pilonidal cyst is a fluid-filled sac. It forms beneath the skin near your tailbone, at the top of the crease of your buttocks. A pilonidal cyst that is not large or infected may not cause symptoms or problems. If the cyst becomes irritated or infected, it may fill with pus. This causes pain and swelling (pilonidal abscess). An infected cyst may need to be treated with medicine, drained, or removed. What are the causes? The cause of a pilonidal cyst is not known. One cause may be a hair that grows into your skin (ingrown hair). What increases the risk? Pilonidal cysts are more common in boys and men. Risk factors include:  Having lots of hair near the crease of the buttocks.  Being overweight.  Having a pilonidal dimple.  Wearing tight clothing.  Not bathing or showering frequently.  Sitting for long periods of time.  What are the signs or symptoms? Signs and symptoms of a pilonidal cyst may include:  Redness.  Pain and tenderness.  Warmth.  Swelling.  Pus.  Fever.  How is this diagnosed? Your health care provider may diagnose a pilonidal cyst based on your symptoms and a physical exam. The health care provider may do a blood test to check for infection. If your cyst is draining pus, your health care provider may take a sample of the drainage to be tested at a laboratory. How is this treated? Surgery is the usual treatment for an infected pilonidal cyst. You may also have to take medicines before surgery. The type of surgery you have depends on the size and severity of the infected cyst. The different kinds of surgery include:  Incision and drainage. This is a procedure to open and drain the cyst.  Marsupialization. In this procedure, a large cyst or abscess may be opened and kept open by stitching the edges of the skin to the cyst walls.  Cyst removal. This procedure involves opening the skin and removing all or part of the cyst.  Follow these  instructions at home:  Follow all of your surgeon's instructions carefully if you had surgery.  Take medicines only as directed by your health care provider.  If you were prescribed an antibiotic medicine, finish it all even if you start to feel better.  Keep the area around your pilonidal cyst clean and dry.  Clean the area as directed by your health care provider. Pat the area dry with a clean towel. Do not rub it as this may cause bleeding.  Remove hair from the area around the cyst as directed by your health care provider.  Do not wear tight clothing or sit in one place for long periods of time.  There are many different ways to close and cover an incision, including stitches, skin glue, and adhesive strips. Follow your health care provider's instructions on: ? Incision care. ? Bandage (dressing) changes and removal. ? Incision closure removal. Contact a health care provider if:  You have drainage, redness, swelling, or pain at the site of the cyst.  You have a fever. This information is not intended to replace advice given to you by your health care provider. Make sure you discuss any questions you have with your health care provider. Document Released: 04/27/2000 Document Revised: 10/06/2015 Document Reviewed: 09/17/2013 Elsevier Interactive Patient Education  2018 [employer]vier Inc.  

## 2017-12-19 LAB — HIV ANTIBODY (ROUTINE TESTING W REFLEX): HIV Screen 4th Generation wRfx: NONREACTIVE

## 2017-12-19 LAB — RPR: RPR Ser Ql: NONREACTIVE

## 2017-12-20 LAB — CERVICOVAGINAL ANCILLARY ONLY
Bacterial vaginitis: POSITIVE — AB
Candida vaginitis: NEGATIVE
Chlamydia: NEGATIVE
Neisseria Gonorrhea: NEGATIVE
Trichomonas: NEGATIVE

## 2017-12-23 MED ORDER — METRONIDAZOLE 500 MG PO TABS
500.0000 mg | ORAL_TABLET | Freq: Two times a day (BID) | ORAL | 0 refills | Status: DC
Start: 2017-12-23 — End: 2018-05-02

## 2017-12-23 NOTE — Progress Notes (Signed)
BV noted on lab results.  Will treat with Flagyl.  Medication sent to client's pharmacy and MyChart note sent to client.    Nolene BernheimERRI Johann Gascoigne, RN, MSN, NP-BC Nurse Practitioner, [facility], [facility] 12/23/2017 8:24 AM

## 2017-12-23 NOTE — Addendum Note (Signed)
Addended by: Currie ParisBURLESON, Wofford Stratton L on: 12/23/2017 08:25 AM   Modules accepted: Orders

## 2018-01-21 ENCOUNTER — Ambulatory Visit (INDEPENDENT_AMBULATORY_CARE_PROVIDER_SITE_OTHER): Payer: 59 | Admitting: Obstetrics and Gynecology

## 2018-01-21 ENCOUNTER — Ambulatory Visit (INDEPENDENT_AMBULATORY_CARE_PROVIDER_SITE_OTHER): Payer: 59 | Admitting: Clinical

## 2018-01-21 ENCOUNTER — Encounter: Payer: Self-pay | Admitting: Obstetrics and Gynecology

## 2018-01-21 VITALS — BP 111/68 | HR 76 | Wt 155.6 lb

## 2018-01-21 DIAGNOSIS — L739 Follicular disorder, unspecified: Secondary | ICD-10-CM

## 2018-01-21 DIAGNOSIS — Z8619 Personal history of other infectious and parasitic diseases: Secondary | ICD-10-CM | POA: Diagnosis not present

## 2018-01-21 DIAGNOSIS — F4323 Adjustment disorder with mixed anxiety and depressed mood: Secondary | ICD-10-CM | POA: Diagnosis not present

## 2018-01-21 NOTE — Progress Notes (Signed)
Having an HSV/Outbreak previously taken valtrex

## 2018-01-21 NOTE — Progress Notes (Signed)
GYNECOLOGY ENCOUNTER NOTE  Subjective:   Danielle Velez is a 26 y.o. No obstetric history on file. female here with concerns about a possible HSV outbreak.  Current complaints: HSV outbreak. Diagnosed 6 years ago.    She has had 2 outbreaks since. Denies abnormal vaginal bleeding, discharge, pelvic pain, problems with intercourse or other gynecologic concerns.    Gynecologic History No LMP recorded. 12/31/17 Contraception: condoms Last Pap: 06/07/17. Results were: normal with negative HPV Last mammogram: NA  Obstetric History OB History  No data available    Past Medical History:  Diagnosis Date  . Angio-edema   . Asthma   . Recurrent upper respiratory infection (URI)   . Urticaria     Past Surgical History:  Procedure Laterality Date  . CESAREAN SECTION    . CESAREAN SECTION  09/16/2010    Current Outpatient Medications on File Prior to Visit  Medication Sig Dispense Refill  . albuterol (PROVENTIL HFA;VENTOLIN HFA) 108 (90 Base) MCG/ACT inhaler Inhale 4 puffs into the lungs every 4 (four) hours as needed for wheezing or shortness of breath (cough). 1 Inhaler 2  . budesonide-formoterol (SYMBICORT) 160-4.5 MCG/ACT inhaler INHALE 2 PUFFS INTO THE LUNGS 2 (TWO) TIMES DAILY 10.2 Inhaler 5  . metroNIDAZOLE (FLAGYL) 500 MG tablet Take 1 tablet (500 mg total) by mouth 2 (two) times daily. No alcohol while taking this medication 14 tablet 0  . mometasone (ASMANEX 60 METERED DOSES) 220 MCG/INH inhaler Inhale 2 puffs into the lungs daily. 1 Inhaler 12  . predniSONE (DELTASONE) 10 MG tablet 2 tablets once daily for 4 days, 1 tablet once daily on day 5 9 tablet 0  . XOLAIR 150 MG injection Inject 150 mg into the skin every 28 (twenty-eight) days.      Current Facility-Administered Medications on File Prior to Visit  Medication Dose Route Frequency Provider Last Rate Last Dose  . omalizumab Geoffry Paradise) injection 300 mg  300 mg Subcutaneous Q28 days Alfonse Spruce, MD   300 mg at  07/18/17 1448    Allergies  Allergen Reactions  . Effexor [Venlafaxine] Other (See Comments)    Shaky   . Other Hives    DAIRY and WHEAT.  REACTION HIVES AND ITCHING.  Marland Kitchen Shellfish Allergy Itching and Swelling    Social History:  reports that she has never smoked. She has never used smokeless tobacco. She reports that she does not drink alcohol or use drugs.  Family History  Problem Relation Age of Onset  . Asthma Maternal Grandmother   . Allergic rhinitis Neg Hx   . Angioedema Neg Hx   . Atopy Neg Hx   . Eczema Neg Hx   . Immunodeficiency Neg Hx   . Urticaria Neg Hx     The following portions of the patient's history were reviewed and updated as appropriate: allergies, current medications, past family history, past medical history, past social history, past surgical history and problem list.  Review of Systems Pertinent items noted in HPI and remainder of comprehensive ROS otherwise negative.   Objective:  BP 111/68   Pulse 76   Wt 155 lb 9.6 oz (70.6 kg)   BMI 30.39 kg/m  CONSTITUTIONAL: Well-developed, well-nourished female in no acute distress.  HENT:  Normocephalic, atraumatic, External right and left ear normal. Oropharynx is clear and moist EYES: Conjunctivae and EOM are normal. Pupils are equal, round, and reactive to light. No scleral icterus.  NECK: Normal range of motion, supple, no masses.  Normal thyroid.  SKIN: Skin is warm and dry. No rash noted. Not diaphoretic. No erythema. No pallor. MUSCULOSKELETAL: Normal range of motion. No tenderness.  No cyanosis, clubbing, or edema.  2+ distal pulses. NEUROLOGIC: Alert and oriented to person, place, and time. Normal reflexes, muscle tone coordination. No cranial nerve deficit noted. PSYCHIATRIC: Normal mood and affect. Normal behavior. Normal judgment and thought content. CARDIOVASCULAR: Normal heart rate noted, regular rhythm RESPIRATORY: Effort and breath sounds normal, no problems with respiration noted. PELVIC:  Normal appearing external genitalia; normal appearing vaginal mucosa and cervix.  Pinpoint follicular lesion noted on left labia minora near clitoris. Draining scant amount of milky yellow discharge when pressed. No surrounding erythema or cellulitis  Non-herpetic appearing.    Assessment and Plan:   1. History of herpes labialis  Patient given RX for Valtrex.   2. Folliculitis  - Warm compresses often. Drainage without cellulitis.  -Return if symptoms worsen   Other orders - valACYclovir (VALTREX) 500 MG tablet; Take 1 tablet (500 mg total) by mouth 2 (two) times daily.    Routine preventative health maintenance measures emphasized. Please refer to After Visit Summary for other counseling recommendations.     Vidya Bamford, Harolyn Rutherford, NP [facility] for [employer], [facility]

## 2018-01-21 NOTE — BH Specialist Note (Signed)
Integrated Behavioral Health Initial Visit  MRN: [id number] Name: Danielle Velez  Number of Integrated Behavioral Health Clinician visits:: 1/6 Session Start time: 12:00  Session End time: 1:00 Total time: 1 hour  Type of Service: Integrated Behavioral Health- Individual/Family Interpretor:No. Interpretor Name and Language: n/a   Warm Hand Off Completed.       SUBJECTIVE: Danielle Velez is a 27 y.o. female accompanied by n/a Patient was referred by Venia Carbon, NP for symptoms of anxiety and depression. Patient reports the following symptoms/concerns: Pt states her primary concern today is feeling overwhelmed with life stress, and is very open to talk about how she is feeling today and learn self-coping strategies. Long-term goal[place].  Duration of problem: Increasing over time; Severity of problem: severe  OBJECTIVE: Mood: Anxious and Depressed and Affect: Tearful Risk of harm to self or others: Suicidal ideation No plan to harm self or others  LIFE CONTEXT: Family and Social: Pt lives with her 7yo son School/Work: Pt works full time, commission-based work Self-Care: Recognizing today the need for improved self-care Life Changes: Daily life changes with finances, being a single mother; holding onto childhood trauma  GOALS ADDRESSED: Patient will: 1. Reduce symptoms of: anxiety, depression and stress 2. Increase knowledge and/or ability of: self-management skills and stress reduction  3. Demonstrate ability to: Increase healthy adjustment to current life circumstances and Increase adequate support systems for patient/family  INTERVENTIONS: Interventions utilized: Solution-Focused Strategies, Mindfulness or Management consultant, Psychoeducation and/or Health Education and Link to [facility]  Standardized Assessments completed: GAD-7 and PHQ 2&9 with C-SSRS  ASSESSMENT: Patient currently experiencing Adjustment disorder with mixed anxious and  depressed mood.   Patient may benefit from psychoeducation and brief therapeutic interventions regarding coping with symptoms of depression and anxiety  .  PLAN: 1. Follow up with behavioral health clinician on : One week via phone 2. Behavioral recommendations:  -Follow Safety Plan, if SI returns -Begin Worry Time strategy today, to prioritize life stressors -CALM relaxation breathing exercise every morning; as needed throughout the day(while playing ocean sound) -Consider establishing care with long-term therapy at either [facility] or [facility] -Read educational materials regarding coping with symptoms of depression and anxiety -Consider [facility] as an additional support  3. Referral(s): Integrated Art gallery manager (In Clinic), Community Mental Health Services (LME/Outside Clinic) and Community Resources:  [employer] 4. "From scale of 1-10, how likely are you to follow plan?": 9  Valetta Close Maripat Borba, LCSW  Depression screen [facility] 2/9 01/21/2018  Decreased Interest 2  Down, Depressed, Hopeless 2  PHQ - 2 Score 4  Altered sleeping 0  Tired, decreased energy 1  Change in appetite 0  Feeling bad or failure about yourself  2  Trouble concentrating 0  Moving slowly or fidgety/restless 0  Suicidal thoughts 2  PHQ-9 Score 9   GAD 7 : Generalized Anxiety Score 01/21/2018  Nervous, Anxious, on Edge 0  Control/stop worrying 1  Worry too much - different things 3  Trouble relaxing 0  Restless 0  Easily annoyed or irritable 3  Afraid - awful might happen 0  Total GAD 7 Score 7

## 2018-01-22 DIAGNOSIS — Z8619 Personal history of other infectious and parasitic diseases: Secondary | ICD-10-CM | POA: Insufficient documentation

## 2018-01-22 DIAGNOSIS — L739 Follicular disorder, unspecified: Secondary | ICD-10-CM | POA: Insufficient documentation

## 2018-01-22 MED ORDER — VALACYCLOVIR HCL 500 MG PO TABS: 500.0000 mg | ORAL_TABLET | Freq: Two times a day (BID) | ORAL | 3 refills | Status: DC

## 2018-01-28 ENCOUNTER — Telehealth: Payer: Self-pay | Admitting: Clinical

## 2018-01-28 NOTE — Telephone Encounter (Signed)
Follow up mood check, as agreed-upon by pt; pt says she has implemented self-coping strategies (relaxation breathing exercise daily helps the most), and says she will consider future appointments, as needed.

## 2018-03-07 ENCOUNTER — Encounter: Payer: Self-pay | Admitting: Allergy

## 2018-03-07 ENCOUNTER — Ambulatory Visit (INDEPENDENT_AMBULATORY_CARE_PROVIDER_SITE_OTHER): Payer: 59 | Admitting: Allergy

## 2018-03-07 VITALS — BP 126/70 | HR 69 | Temp 98.4°F | Resp 19 | Ht 60.25 in | Wt 157.6 lb

## 2018-03-07 DIAGNOSIS — T781XXD Other adverse food reactions, not elsewhere classified, subsequent encounter: Secondary | ICD-10-CM

## 2018-03-07 DIAGNOSIS — J3089 Other allergic rhinitis: Secondary | ICD-10-CM

## 2018-03-07 DIAGNOSIS — J4541 Moderate persistent asthma with (acute) exacerbation: Secondary | ICD-10-CM

## 2018-03-07 DIAGNOSIS — L501 Idiopathic urticaria: Secondary | ICD-10-CM | POA: Diagnosis not present

## 2018-03-07 MED ORDER — AZITHROMYCIN 250 MG PO TABS
ORAL_TABLET | ORAL | 0 refills | Status: DC
Start: 2018-03-07 — End: 2018-06-05

## 2018-03-07 MED ORDER — ALBUTEROL SULFATE HFA 108 (90 BASE) MCG/ACT IN AERS: 4.0000 | INHALATION_SPRAY | RESPIRATORY_TRACT | 1 refills | Status: DC | PRN

## 2018-03-07 MED ORDER — BUDESONIDE-FORMOTEROL FUMARATE 160-4.5 MCG/ACT IN AERO: INHALATION_SPRAY | RESPIRATORY_TRACT | 5 refills | Status: DC

## 2018-03-07 NOTE — Progress Notes (Signed)
Follow-up Note  RE: Danielle Velez MRN: [id number] DOB: 04/03/1991 Date of Office Visit: 03/07/2018   History of present illness: Danielle Velez is a 27 y.o. female presenting today for sick visit.  She was last seen in the office on November 11, 2017 by our nurse practitioner, Thermon Leyland which time she was sick with an exacerbation at that time as well.  She has a history of asthma, allergic rhinitis, food allergy and urticaria.  She states she first started getting sick around the first week of October.  She states she was having sore throat, chest pain, shortness of breath, cough with green-yellowish sputum as well as chills.  She also states she feels like she has had some muscle aches early on.  She did go to an urgent care during the first week of symptoms and was prescribed amoxicillin course and was recommended to take cough medication.  She does not feel that this helped with her symptoms and symptoms continued to worsen.  She states she has been taking Mucinex and over-the-counter cold medications.  She has not taken her temperature to see if she has had frank fevers.  she also states she does not have Asmanex that was recommended she add onto her Symbicort during respiratory tract illnesses at her last visit.  She normally uses Symbicort 160 mcg and takes 2 puffs twice a day but states she ran out last week.  She states she has required use of her albuterol about 2-3 times a day with temporary relief of symptoms.  She also reports a lot of nasal drainage.  She has never tried use of nasal saline rinses.  She has not received her flu vaccine this season.  She has history of allergic rhinitis as well as urticaria and she was on Xolair previously.  She also has food allergies and avoids peanuts, shellfish, wheat and dairy.  Review of systems: Review of Systems  Constitutional: Positive for chills and malaise/fatigue. Negative for fever.  HENT: Positive for congestion and sore throat. Negative for  ear discharge, ear pain, nosebleeds and sinus pain.   Eyes: Negative for pain, discharge and redness.  Respiratory: Positive for cough, sputum production and shortness of breath. Negative for hemoptysis.   Cardiovascular: Positive for chest pain (tightness).  Gastrointestinal: Negative for abdominal pain, constipation, diarrhea, nausea and vomiting.  Musculoskeletal: Negative for joint pain.  Skin: Negative for itching and rash.  Neurological: Negative for headaches.    All other systems negative unless noted above in HPI  Past medical/social/surgical/family history have been reviewed and are unchanged unless specifically indicated below.  No changes  Medication List: Allergies as of 03/07/2018      Reactions   Effexor [venlafaxine] Other (See Comments)   Shaky    Other Hives   DAIRY and WHEAT.  REACTION HIVES AND ITCHING.   Shellfish Allergy Itching, Swelling      Medication List        Accurate as of 03/07/18  4:13 PM. Always use your most recent med list.          albuterol 108 (90 Base) MCG/ACT inhaler Commonly known as:  PROVENTIL HFA;VENTOLIN HFA Inhale 4 puffs into the lungs every 4 (four) hours as needed for wheezing or shortness of breath (cough).   azithromycin 250 MG tablet Commonly known as:  ZITHROMAX Take 2 tab (500mg ) day1 and 1 tab (250mg ) day 2-5   budesonide-formoterol 160-4.5 MCG/ACT inhaler Commonly known as:  SYMBICORT INHALE 2 PUFFS INTO THE LUNGS  2 (TWO) TIMES DAILY   metroNIDAZOLE 500 MG tablet Commonly known as:  FLAGYL Take 1 tablet (500 mg total) by mouth 2 (two) times daily. No alcohol while taking this medication   valACYclovir 500 MG tablet Commonly known as:  VALTREX Take 1 tablet (500 mg total) by mouth 2 (two) times daily.       Known medication allergies: Allergies  Allergen Reactions  . Effexor [Venlafaxine] Other (See Comments)    Shaky   . Other Hives    DAIRY and WHEAT.  REACTION HIVES AND ITCHING.  Marland Kitchen Shellfish  Allergy Itching and Swelling     Physical examination: Blood pressure 126/70, pulse 69, temperature 98.4 F (36.9 C), temperature source Oral, resp. rate 19, height 5' 0.25" (1.53 m), weight 157 lb 9.6 oz (71.5 kg), SpO2 97 %.  General: Alert, appears ill, laying on table HEENT: PERRLA, TMs pearly gray, turbinates moderately edematous with clear discharge, post-pharynx non erythematous. Neck: Supple without lymphadenopathy. Lungs: Decreased breath sounds bilaterally without wheezing, rhonchi or rales. {no increased work of breathing.  Improved aeration with BD CV: Normal S1, S2 without murmurs. Abdomen: Nondistended, nontender. Skin: Warm and dry, without lesions or rashes. Extremities:  No clubbing, cyanosis or edema. Neuro:   Grossly intact.  Diagnositics/Labs:  Spirometry: FEV1: 2.00L 82%, FVC: 2.73L 98%, ratio consistent with nonobstructive pattern  Assessment and plan:   Moderate persistent asthma with exacerbation - it is probable that she may have had influenza early however now is outside of tamiflu window.  Also given prolonged duration of symptoms with cover for atypical bacteria with Z-pak - Daily controller medication(s): Symbicort 160/4.5 two puffs twice daily with spacer - Rescue medications: ProAir 4 puffs every 4-6 hours as needed or albuterol nebulizer one vial puffs every 4-6 hours as needed - During respiratory infections or worsening symptoms: add Asmanex with spacer two puffs twice daily for two weeks - take prednisone pack as prescribed - will prescribe azithromycin pack 500mg  day 1, 250mg  day 2-5 - maintain adequate hydration and rest.  - ibuprofen as needed for pain control  - Asthma control goals:  * Full participation in all desired activities (may need albuterol before activity) * Albuterol use two time or less a week on average (not counting use with activity) * Cough interfering with sleep two time or less a month * Oral steroids no more than  once a year * No hospitalizations  Allergic rhinitis - Continue with Zyrtec (cetirizine) 10mg  daily. - for nasal congestion/drainage recommend use of nasal steroid spray like Flonase, Rhinocort or Nasacort 2 sprays each nostril daily.  Use for 1-2 weeks at a time before stopping - recommend performing nasal saline rinses prior to use of nasal sprays and during illnesses to help flush/clean out the nose/sinuses  Urticaria (hives) - Continue zyrtec - if recurrent recommend resuming Xolair injections     Food allergies     - continue avoidance of shellfish, wheat, dairy and peanut that you have been previously avoiding.    - have access to self-injectable epinephrine (Epipen or AuviQ) 0.3mg  at all times    - repeat testing for peanut at a future visit    - follow emergency action plan  Follow up in 3-4 months or sooner if needed  I appreciate the opportunity to take part in [facility] care. Please do not hesitate to contact me with questions.  Sincerely,   Margo Aye, MD Allergy/Immunology [facility]

## 2018-03-07 NOTE — Patient Instructions (Addendum)
Moderate persistent asthma with exacerbation - Daily controller medication(s): Symbicort 160/4.5 two puffs twice daily with spacer - Rescue medications: ProAir 4 puffs every 4-6 hours as needed or albuterol nebulizer one vial puffs every 4-6 hours as needed - During respiratory infections or worsening symptoms: add Asmanex with spacer two puffs twice daily for two weeks - take prednisone pack as prescribed - will prescribe azithromycin pack to cover for atypical bacteria as you have already completed amoxicillin course - maintain adequate hydration and rest.  - ibuprofen as needed for pain control  - Asthma control goals:  * Full participation in all desired activities (may need albuterol before activity) * Albuterol use two time or less a week on average (not counting use with activity) * Cough interfering with sleep two time or less a month * Oral steroids no more than once a year * No hospitalizations  Allergic rhinitis - Continue with Zyrtec (cetirizine) 10mg  daily. - for nasal congestion/drainage recommend use of nasal steroid spray like Flonase, Rhinocort or Nasacort 2 sprays each nostril daily.  Use for 1-2 weeks at a time before stopping - recommend performing nasal saline rinses prior to use of nasal sprays and during illnesses to help flush/clean out the nose/sinuses  Urticaria (hives) - Continue zyrtec - if recurrent recommend resuming Xolair injections     Food allergies     - continue avoidance of shellfish, wheat, dairy and peanut that you have been previously avoiding.    - have access to self-injectable epinephrine (Epipen or AuviQ) 0.3mg  at all times    - repeat testing for peanut at a future visit    - follow emergency action plan  Follow up in 3-4 months or sooner if needed

## 2018-03-31 ENCOUNTER — Encounter: Payer: Self-pay | Admitting: Family Medicine

## 2018-03-31 ENCOUNTER — Ambulatory Visit (INDEPENDENT_AMBULATORY_CARE_PROVIDER_SITE_OTHER): Payer: 59 | Admitting: Family Medicine

## 2018-03-31 VITALS — BP 116/61 | HR 56 | Wt 159.0 lb

## 2018-03-31 DIAGNOSIS — Z7251 High risk heterosexual behavior: Secondary | ICD-10-CM

## 2018-03-31 DIAGNOSIS — N898 Other specified noninflammatory disorders of vagina: Secondary | ICD-10-CM | POA: Diagnosis not present

## 2018-03-31 DIAGNOSIS — Z113 Encounter for screening for infections with a predominantly sexual mode of transmission: Secondary | ICD-10-CM

## 2018-03-31 MED ORDER — FLUCONAZOLE 150 MG PO TABS: 150.0000 mg | ORAL_TABLET | Freq: Once | ORAL | 0 refills | Status: AC

## 2018-03-31 MED ORDER — METRONIDAZOLE 500 MG PO TABS: 500.0000 mg | ORAL_TABLET | Freq: Two times a day (BID) | ORAL | 0 refills | Status: DC

## 2018-03-31 NOTE — Progress Notes (Signed)
   Subjective:    Hendricks MiloKenyetta Min - 27 y.o. female MRN [id number]  Date of birth: 10/25/1990  HPI  Hendricks MiloKenyetta Bokhari is a 27 y.o. No obstetric history on file. female here for unprotected sex and vaginal discharge. States that discharge seems similar to what she had when she had trichomoniasis in the past. Also has some itching and fishy odor. Would like a full STD check and exam.  Denies vaginal pain.    OB History   None       Health Maintenance:   There are no preventive care reminders to display for this patient.  -  reports that she has never smoked. She has never used smokeless tobacco. - Review of Systems: Per HPI. - Past Medical History: Patient Active Problem List   Diagnosis Date Noted  . History of herpes labialis 01/22/2018  . Folliculitis 01/22/2018  . Chest pain on breathing 11/11/2017  . Possible pregnancy 11/11/2017  . Adverse food reaction 11/11/2017  . Recurrent pleurisy 07/19/2016  . Moderate persistent asthma 03/22/2015  . Food allergy 03/22/2015  . Asthma with acute exacerbation 03/09/2015  . Allergic rhinitis 03/09/2015   - Medications: reviewed and updated   Objective:   Physical Exam BP 116/61   Pulse (!) 56   Wt 159 lb (72.1 kg)   LMP 03/15/2018   BMI 30.80 kg/m  Gen: NAD, alert, cooperative with exam, well-appearing HEENT: NCAT, PERRL, clear conjunctiva, oropharynx clear, supple neck CV: RRR, good S1/S2, no murmur, no edema, capillary refill brisk  Resp: CTABL, no wheezes, non-labored Abd: SNTND, BS present, no guarding or organomegaly Skin: no rashes, normal turgor  Neuro: no gross deficits.  Psych: good insight, alert and oriented GU/GYN: Exam performed in the presence of a chaperone. External genitalia within normal limits.  Vaginal mucosa pink, moist, normal rugae.  Nonfriable cervix without lesions, small amount of pale yellow nonodorous discharge; no bleeding noted on speculum exam.  Bimanual exam revealed normal, nongravid uterus.  No  cervical motion tenderness. No adnexal masses bilaterally.        Assessment & Plan:   1. Unprotected sex - Cervicovaginal ancillary only( [place]) - HIV antibody (with reflex) - Hepatitis B Surface AntiGEN - Hepatitis C Antibody - RPR  2. Vaginal discharge - patient requests treatment for both BV and yeast while waiting for results; rx for diflucan and flagyl provided - Cervicovaginal ancillary only( [place]) - HIV antibody (with reflex) - Hepatitis B Surface AntiGEN - Hepatitis C Antibody - RPR   Routine preventative health maintenance measures emphasized. Please refer to After Visit Summary for other counseling recommendations.   Return if symptoms worsen or fail to improve.  Gwenevere AbbotNimeka Baer Hinton, MD OB Fellow  04/01/2018, 10:50 PM

## 2018-03-31 NOTE — Progress Notes (Signed)
Right before period started was having some vag itching and abd cramping. Got better when period started. When period stopped, symptoms returned. Took some left over Flagyl and helped odor but not itching. Cont having abd cramping, yellow vag d/c and vag itching. Symptoms started after having unprotected intercourse. Have had trich before and symptoms reminded me of that.

## 2018-04-01 LAB — HEPATITIS C ANTIBODY: Hep C Virus Ab: <0.1 s/co ratio (ref 0.0–0.9)

## 2018-04-01 LAB — HEPATITIS B SURFACE ANTIGEN: Hepatitis B Surface Ag: NEGATIVE

## 2018-04-01 LAB — HIV ANTIBODY (ROUTINE TESTING W REFLEX): HIV Screen 4th Generation wRfx: NONREACTIVE

## 2018-04-01 LAB — RPR: RPR Ser Ql: NONREACTIVE

## 2018-04-02 LAB — CERVICOVAGINAL ANCILLARY ONLY
Bacterial vaginitis: NEGATIVE
Candida vaginitis: NEGATIVE
Chlamydia: NEGATIVE
Neisseria Gonorrhea: NEGATIVE
Trichomonas: POSITIVE — AB

## 2018-04-30 ENCOUNTER — Telehealth: Payer: Self-pay | Admitting: *Deleted

## 2018-04-30 NOTE — Telephone Encounter (Signed)
Spero GeraldsKenyetta called and left a message this am that she saw Dr. Vear ClockPhillips a few weeks ago and was given RX for flagyl .States she had about 3 days left and she thinks her son threw away her medicine. States she doesn't think her symptoms have all gone away and wants to know if she can have a refill.

## 2018-04-30 NOTE — Telephone Encounter (Signed)
I called Yanis back and was unable to leave a message. Will send MyChart message that I am forwarding request to Dr. Loistine ChancePhilip.

## 2018-05-02 ENCOUNTER — Other Ambulatory Visit: Payer: Self-pay | Admitting: Family Medicine

## 2018-05-02 MED ORDER — METRONIDAZOLE 500 MG PO TABS: 500.0000 mg | ORAL_TABLET | Freq: Two times a day (BID) | ORAL | 0 refills | Status: DC

## 2018-05-02 NOTE — Progress Notes (Signed)
Refilled Flagyl with full 7 day course. Please tell patient if she calls back to complete the entire course. Thank you!

## 2018-05-08 ENCOUNTER — Encounter: Payer: Self-pay | Admitting: *Deleted

## 2018-05-19 ENCOUNTER — Encounter: Payer: Self-pay | Admitting: General Practice

## 2018-05-19 MED ORDER — FLUCONAZOLE 150 MG PO TABS: 150.0000 mg | ORAL_TABLET | Freq: Once | ORAL | 0 refills | Status: AC

## 2018-05-19 NOTE — Progress Notes (Signed)
Patient presents to office stating she doesn't think the medication we gave her worked. Patient reports intense vaginal itching. Patient states she was recently treated at an urgent care and also took the prescription we gave her last week as well. Patient denies intercourse since November visit. Discussed she likely has a yeast infection after all the antibiotics. Sent in diflucan per protocol. Discussed returning to office if she still has symptoms after this. Patient verbalized understanding & had no questions.

## 2018-06-05 ENCOUNTER — Encounter: Payer: Self-pay | Admitting: Allergy

## 2018-06-05 ENCOUNTER — Ambulatory Visit: Payer: 59 | Admitting: Allergy

## 2018-06-05 VITALS — BP 104/68 | HR 68 | Temp 98.3°F | Resp 20

## 2018-06-05 DIAGNOSIS — J302 Other seasonal allergic rhinitis: Secondary | ICD-10-CM | POA: Insufficient documentation

## 2018-06-05 DIAGNOSIS — J4541 Moderate persistent asthma with (acute) exacerbation: Secondary | ICD-10-CM

## 2018-06-05 DIAGNOSIS — J069 Acute upper respiratory infection, unspecified: Secondary | ICD-10-CM | POA: Diagnosis not present

## 2018-06-05 DIAGNOSIS — J3089 Other allergic rhinitis: Secondary | ICD-10-CM

## 2018-06-05 DIAGNOSIS — T781XXD Other adverse food reactions, not elsewhere classified, subsequent encounter: Secondary | ICD-10-CM

## 2018-06-05 MED ORDER — DOXYCYCLINE HYCLATE 100 MG PO TABS: 100.0000 mg | ORAL_TABLET | Freq: Two times a day (BID) | ORAL | 0 refills | Status: AC

## 2018-06-05 NOTE — Progress Notes (Signed)
Follow Up Note  RE: Danielle Velez MRN: [id number] DOB: 1990/09/08 Date of Office Visit: 06/05/2018  Referring provider: No ref. provider found Primary care provider: Patient, No Pcp Per  Chief Complaint: Sick (chills, body aches, stuffy nose, possible fever, hot/cold flashes, productive cough with yellow sputum. 12 breathing treatments since last Friday. Her boyfriend was sick with same/similar symptoms. )  History of Present Illness: I had the pleasure of seeing Danielle Velez for a follow up visit at the [facility] on 06/05/2018. She is a 28 y.o. female, who is being followed for asthma, allergic rhinitis, urticaria, adverse food reaction. Today she is here for new complaint of not feeling well. Her previous allergy office visit was on 03/07/2018 with Dr. Delorse Lek.   Moderate persistent asthma with exacerbation Last Friday patient started to feel weak with rhinorrhea, coughing with mucous production. Took mucinex with minimal relief. Had loss of appetite and feeling tired. The last few days her symptoms went down to her chest and still having quite a bit of colored, thick mucous.  Has not been able to check her temperature at home but has been feeling warm. Patient's boyfriend is sick too.  Stopped Symbicort 160 2 puffs BID on Friday as it was not helping and started to use the nebulizer every 6 hours instead. She did not add on Asmanex as instructed per the last visit during URIs.  Last prednisone and antibiotic course was at the last visit in October.   Allergic rhinitis Using zyrtec as needed. Not using any nasal sprays at this time.  Urticaria (hives) Resolved.  Food allergies  No allergic reactions. Avoiding shellfish, wheat, dairy and peanut.  Assessment and Plan: Quinteria is a 28 y.o. female with: Moderate persistent asthma with acute exacerbation Asthma flare most likely due to current URI/bronchitis.  Discussed with patient difference between  rescue and maintenance inhalers and the importance of continuing maintenance inhalers during asthma flares.  . Start prednisone taper. . Start doxycycline 100mg  twice a day for 10 days. Wear sunblock if going outside.  . Daily controller medication(s): restart Symbicort 160 2 puffs twice a day with spacer and rinse mouth afterwards. . Prior to physical activity: May use albuterol rescue inhaler 2 puffs 5 to 15 minutes prior to strenuous physical activities. Marland Kitchen Rescue medications: May use albuterol rescue inhaler 2 puffs or nebulizer every 4 to 6 hours as needed for shortness of breath, chest tightness, coughing, and wheezing. Monitor frequency of use.  . During upper respiratory infections: Start Asmanex 100 2 puffs twice a day for 1-2 weeks. Sample given.   Seasonal and perennial allergic rhinitis Past history - 2016 skin testing was positive to grass, weeds, ragweed, tree, mold, dust mites, cat, dog, cockroach.  Interim history - not using any nasal sprays.  Continue with Zyrtec (cetirizine) 10mg  daily.  For nasal congestion/drainage recommend use of nasal steroid spray like Flonase, Rhinocort or Nasacort 2 sprays each nostril daily.  Use for 1-2 weeks at a time before stopping. Sample of Nasacort given.   Recommend performing nasal saline rinses prior to use of nasal sprays and during illnesses to help flush/clean out the nose/sinuses.  Upper respiratory tract infection See assessment and plan as above.  Adverse food reaction Past history - 2016 skin testing was positive to shellfish. Interim history - no accidental ingestion or reactions.  Continue avoidance of shellfish, wheat, dairy and peanut that you have been previously avoiding.  Have access to self-injectable epinephrine (Epipen or  AuviQ) 0.3mg  at all times  Repeat testing for peanut at a future visit  Follow emergency action plan  Return in about 2 months (around 08/04/2018).  Meds ordered this encounter  Medications  .  doxycycline (VIBRA-TABS) 100 MG tablet    Sig: Take 1 tablet (100 mg total) by mouth 2 (two) times daily for 10 days.    Dispense:  20 tablet    Refill:  0   Diagnostics: None.  Medication List:  Current Outpatient Medications  Medication Sig Dispense Refill  . albuterol (PROVENTIL HFA;VENTOLIN HFA) 108 (90 Base) MCG/ACT inhaler Inhale 4 puffs into the lungs every 4 (four) hours as needed for wheezing or shortness of breath (cough). 1 Inhaler 1  . budesonide-formoterol (SYMBICORT) 160-4.5 MCG/ACT inhaler INHALE 2 PUFFS INTO THE LUNGS 2 (TWO) TIMES DAILY 10.2 Inhaler 5  . doxycycline (VIBRA-TABS) 100 MG tablet Take 1 tablet (100 mg total) by mouth 2 (two) times daily for 10 days. 20 tablet 0   Current Facility-Administered Medications  Medication Dose Route Frequency Provider Last Rate Last Dose  . omalizumab Geoffry Paradise(XOLAIR) injection 300 mg  300 mg Subcutaneous Q28 days Alfonse SpruceGallagher, Joel Louis, MD   300 mg at 07/18/17 1448   Allergies: Allergies  Allergen Reactions  . Effexor [Venlafaxine] Other (See Comments)    Shaky   . Other Hives    DAIRY and WHEAT.  REACTION HIVES AND ITCHING.  Marland Kitchen. Shellfish Allergy Itching and Swelling   I reviewed her past medical history, social history, family history, and environmental history and no significant changes have been reported from previous visit on 03/07/2018.  Review of Systems  Constitutional: Positive for appetite change. Negative for chills, fever and unexpected weight change.  HENT: Positive for congestion. Negative for rhinorrhea.   Eyes: Negative for itching.  Respiratory: Positive for cough, chest tightness, shortness of breath and wheezing.   Gastrointestinal: Negative for abdominal pain.  Skin: Negative for rash.  Allergic/Immunologic: Positive for food allergies.  Neurological: Negative for headaches.   Objective: BP 104/68 (BP Location: Left Arm, Patient Position: Sitting, Cuff Size: Normal)   Pulse 68   Temp 98.3 F (36.8 C)  (Tympanic)   Resp 20   SpO2 98%  There is no height or weight on file to calculate BMI. Physical Exam  Constitutional: She is oriented to person, place, and time. She appears well-developed and well-nourished.  HENT:  Head: Normocephalic and atraumatic.  Right Ear: External ear normal.  Left Ear: External ear normal.  Nose: Nose normal.  Mouth/Throat: Oropharynx is clear and moist.  Eyes: Conjunctivae and EOM are normal.  Neck: Neck supple.  Cardiovascular: Normal rate, regular rhythm and normal heart sounds. Exam reveals no gallop and no friction rub.  No murmur heard. Pulmonary/Chest: Effort normal and breath sounds normal. She has no wheezes. She has no rales.  Lymphadenopathy:    She has no cervical adenopathy.  Neurological: She is alert and oriented to person, place, and time.  Skin: Skin is warm. No rash noted.  Psychiatric: She has a normal mood and affect. Her behavior is normal.  Nursing note and vitals reviewed.  Previous notes and tests were reviewed. The plan was reviewed with the patient/family, and all questions/concerned were addressed.  It was my pleasure to see Danielle GeraldsKenyetta today and participate in her care. Please feel free to contact me with any questions or concerns.  Sincerely,  Wyline MoodYoon Tameaka Eichhorn, DO Allergy & Immunology  [facility] [place] office: [phone] [facility]  office: [phone]

## 2018-06-05 NOTE — Assessment & Plan Note (Signed)
Past history - 2016 skin testing was positive to grass, weeds, ragweed, tree, mold, dust mites, cat, dog, cockroach.  Interim history - not using any nasal sprays.  Continue with Zyrtec (cetirizine) 10mg  daily.  For nasal congestion/drainage recommend use of nasal steroid spray like Flonase, Rhinocort or Nasacort 2 sprays each nostril daily.  Use for 1-2 weeks at a time before stopping. Sample of Nasacort given.   Recommend performing nasal saline rinses prior to use of nasal sprays and during illnesses to help flush/clean out the nose/sinuses.

## 2018-06-05 NOTE — Assessment & Plan Note (Addendum)
Asthma flare most likely due to current URI/bronchitis.  Discussed with patient difference between rescue and maintenance inhalers and the importance of continuing maintenance inhalers during asthma flares.  . Start prednisone taper. . Start doxycycline 100mg  twice a day for 10 days. Wear sunblock if going outside.  . Daily controller medication(s): restart Symbicort 160 2 puffs twice a day with spacer and rinse mouth afterwards. . Prior to physical activity: May use albuterol rescue inhaler 2 puffs 5 to 15 minutes prior to strenuous physical activities. Marland Kitchen Rescue medications: May use albuterol rescue inhaler 2 puffs or nebulizer every 4 to 6 hours as needed for shortness of breath, chest tightness, coughing, and wheezing. Monitor frequency of use.  . During upper respiratory infections: Start Asmanex 100 2 puffs twice a day for 1-2 weeks. Sample given.

## 2018-06-05 NOTE — Patient Instructions (Addendum)
Moderate persistent asthma with exacerbation . Start prednisone taper. . Start doxycycline 100mg  twice a day for 10 days. Wear sunblock if going outside.  . Daily controller medication(s): restart Symbicort 160 2 puffs twice a day with spacer and rinse mouth afterwards . Prior to physical activity: May use albuterol rescue inhaler 2 puffs 5 to 15 minutes prior to strenuous physical activities. Marland Kitchen Rescue medications: May use albuterol rescue inhaler 2 puffs or nebulizer every 4 to 6 hours as needed for shortness of breath, chest tightness, coughing, and wheezing. Monitor frequency of use.  . During upper respiratory infections: Start Asmanex 100 2 puffs twice a day for 1-2 weeks . Asthma control goals:  o Full participation in all desired activities (may need albuterol before activity) o Albuterol use two times or less a week on average (not counting use with activity) o Cough interfering with sleep two times or less a month o Oral steroids no more than once a year o No hospitalizations  Allergic rhinitis - Continue with Zyrtec (cetirizine) 10mg  daily. - for nasal congestion/drainage recommend use of nasal steroid spray like Flonase, Rhinocort or Nasacort 2 sprays each nostril daily.  Use for 1-2 weeks at a time before stopping - recommend performing nasal saline rinses prior to use of nasal sprays and during illnesses to help flush/clean out the nose/sinuses  Food allergies     - continue avoidance of shellfish, wheat, dairy and peanut that you have been previously avoiding.    - have access to self-injectable epinephrine (Epipen or AuviQ) 0.3mg  at all times    - repeat testing for peanut at a future visit    - follow emergency action plan   Follow up in 2 months

## 2018-06-05 NOTE — Assessment & Plan Note (Signed)
Past history - 2016 skin testing was positive to shellfish. Interim history - no accidental ingestion or reactions.  Continue avoidance of shellfish, wheat, dairy and peanut that you have been previously avoiding.  Have access to self-injectable epinephrine (Epipen or AuviQ) 0.3mg  at all times  Repeat testing for peanut at a future visit  Follow emergency action plan

## 2018-06-05 NOTE — Assessment & Plan Note (Signed)
.   See assessment and plan as above. 

## 2018-07-30 ENCOUNTER — Ambulatory Visit (INDEPENDENT_AMBULATORY_CARE_PROVIDER_SITE_OTHER): Payer: 59 | Admitting: Obstetrics and Gynecology

## 2018-07-30 ENCOUNTER — Other Ambulatory Visit: Payer: Self-pay

## 2018-07-30 ENCOUNTER — Encounter: Payer: Self-pay | Admitting: Obstetrics and Gynecology

## 2018-07-30 VITALS — BP 110/72 | HR 67 | Wt 161.4 lb

## 2018-07-30 DIAGNOSIS — A601 Herpesviral infection of perianal skin and rectum: Secondary | ICD-10-CM | POA: Insufficient documentation

## 2018-07-30 DIAGNOSIS — Z113 Encounter for screening for infections with a predominantly sexual mode of transmission: Secondary | ICD-10-CM

## 2018-07-30 DIAGNOSIS — N898 Other specified noninflammatory disorders of vagina: Secondary | ICD-10-CM | POA: Insufficient documentation

## 2018-07-30 MED ORDER — VALACYCLOVIR HCL 500 MG PO TABS: 500.0000 mg | ORAL_TABLET | Freq: Two times a day (BID) | ORAL | 2 refills | Status: AC

## 2018-07-30 NOTE — Progress Notes (Signed)
Patient reports having a vaginal rash.bump for one week now. She reports some itching and irritation.

## 2018-07-30 NOTE — Progress Notes (Signed)
GYNECOLOGY ANNUAL PREVENTATIVE CARE ENCOUNTER NOTE  History:     Danielle Velez is a 28 y.o.  here with vaginal irritation. 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  Said the area on her vaginal is rash like. It started out itching and then a bump appeared. She is also requesting STI testing today as well.    Obstetric History OB History  No obstetric history on file.    Past Medical History:  Diagnosis Date  . Angio-edema   . Asthma   . Recurrent upper respiratory infection (URI)   . Urticaria     Past Surgical History:  Procedure Laterality Date  . CESAREAN SECTION    . CESAREAN SECTION  09/16/2010    Current Outpatient Medications on File Prior to Visit  Medication Sig Dispense Refill  . albuterol (PROVENTIL HFA;VENTOLIN HFA) 108 (90 Base) MCG/ACT inhaler Inhale 4 puffs into the lungs every 4 (four) hours as needed for wheezing or shortness of breath (cough). 1 Inhaler 1  . budesonide-formoterol (SYMBICORT) 160-4.5 MCG/ACT inhaler INHALE 2 PUFFS INTO THE LUNGS 2 (TWO) TIMES DAILY 10.2 Inhaler 5  . Mometasone Furoate (ASMANEX HFA) 200 MCG/ACT AERO Inhale into the lungs.    Marland Kitchen omeprazole (PRILOSEC) 40 MG capsule      Current Facility-Administered Medications on File Prior to Visit  Medication Dose Route Frequency Provider Last Rate Last Dose  . omalizumab Geoffry Paradise) injection 300 mg  300 mg Subcutaneous Q28 days Alfonse Spruce, MD   300 mg at 07/18/17 1448    Allergies  Allergen Reactions  . Effexor [Venlafaxine] Other (See Comments)    Shaky   . Other Hives    DAIRY and WHEAT.  REACTION HIVES AND ITCHING.  Marland Kitchen Shellfish Allergy Itching and Swelling    Social History:  reports that she has never smoked. She has never used smokeless tobacco. She reports that she does not drink alcohol or use drugs.  Family History  Problem Relation Age of Onset  . Asthma Maternal Grandmother   . Allergic rhinitis Neg Hx   . Angioedema Neg Hx   .  Atopy Neg Hx   . Eczema Neg Hx   . Immunodeficiency Neg Hx   . Urticaria Neg Hx     The following portions of the patient's history were reviewed and updated as appropriate: allergies, current medications, past family history, past medical history, past social history, past surgical history and problem list.  Review of Systems Pertinent items noted in HPI and remainder of comprehensive ROS otherwise negative.  Physical Exam:  BP 110/72   Pulse 67   Wt 161 lb 6.4 oz (73.2 kg)   LMP 06/09/2018   BMI 31.27 kg/m  CONSTITUTIONAL: Well-developed, well-nourished female in no acute distress.  HENT:  Normocephalic, atraumatic, External right and left ear normal. Oropharynx is clear and moist EYES: Conjunctivae and EOM are normal. Pupils are equal, round, and reactive to light. No scleral icterus.  NECK: Normal range of motion, supple, no masses.  Normal thyroid.  SKIN: Skin is warm and dry. No rash noted. Not diaphoretic. No erythema. No pallor. MUSCULOSKELETAL: Normal range of motion. No tenderness.  No cyanosis, clubbing, or edema.  2+ distal pulses. NEUROLOGIC: Alert and oriented to person, place, and time. Normal reflexes, muscle tone coordination. No cranial nerve deficit noted. PSYCHIATRIC: Normal mood and affect. Normal behavior. Normal judgment and thought content. CARDIOVASCULAR: Normal heart rate noted, regular rhythm RESPIRATORY: Clear to auscultation bilaterally. Effort  and breath sounds normal, no problems with respiration noted. BREASTS: Symmetric in size. No masses, skin changes, nipple drainage, or lymphadenopathy. ABDOMEN: Soft, normal bowel sounds, no distention noted.  No tenderness, rebound or guarding.  PELVIC: single herpetic lesion noted on the right side of perianal region. Swabs collected without speculum.     Assessment and Plan:   1. 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    Yahaira Bruski, Harolyn Rutherford, NP [facility] [employer], [facility]

## 2018-07-31 ENCOUNTER — Telehealth: Payer: Self-pay

## 2018-07-31 MED ORDER — PREDNISONE 10 MG PO TABS: ORAL_TABLET | ORAL | 0 refills | Status: DC

## 2018-07-31 NOTE — Telephone Encounter (Signed)
Sent in prednisone taper. Agree that she is low risk for COVID-19 at this time. Continue with social distancing. I would like to get her in to see [place] this summer. She has prednisone in October, January, and now March. We need to get her on a steroid sparing asthma biologic. She was on Xolair and if she has insurance again, we could go ahead and get Xolair back on board.  Malachi Bonds, MD [facility]

## 2018-07-31 NOTE — Telephone Encounter (Signed)
I called and advised patient of Dr Ellouise Newer instructions and Rx sent.  She does want to restart Xolair so I will get that approval process start and reach back out to patient

## 2018-07-31 NOTE — Addendum Note (Signed)
Addended by: Alfonse Spruce on: 07/31/2018 12:25 PM   Modules accepted: Orders

## 2018-07-31 NOTE — Telephone Encounter (Signed)
Thanks Tammy!   Marlow Berenguer, MD [facility]  

## 2018-07-31 NOTE — Telephone Encounter (Signed)
Patient called, she stated that she has been having some extreme shortness of breath.  She did state that she is aware that this is allergy season, but given everything that is going on she wanted to know if she should be concerned.  She reports no fever or cough, but did say that [place] the zoo [place] and ever since then has not been able to catch her breath.   She stated that she has had to use her Albuterol, several times and it is not helping and seems to be getting worse.   Informed patient that at this point, it seems more like an asthma exacerbation and to continue to monitor symptoms for fever or cough and if those symptoms appear, to call her doctor prior to going in to be seen if those symptoms persist and if she is in distress. Patient voiced understanding.   Patient is wanting to know if she can have Prednisone called in. Stated that this has worked in the past. Please advise.

## 2018-08-02 LAB — CERVICOVAGINAL ANCILLARY ONLY
Bacterial vaginitis: NEGATIVE
Candida vaginitis: NEGATIVE
Chlamydia: NEGATIVE
Neisseria Gonorrhea: NEGATIVE
Trichomonas: NEGATIVE

## 2018-08-22 ENCOUNTER — Telehealth: Payer: 59 | Admitting: Family

## 2018-08-22 DIAGNOSIS — N760 Acute vaginitis: Secondary | ICD-10-CM | POA: Diagnosis not present

## 2018-08-22 MED ORDER — METRONIDAZOLE 500 MG PO TABS: 500.0000 mg | ORAL_TABLET | Freq: Two times a day (BID) | ORAL | 0 refills | Status: DC

## 2018-08-22 NOTE — Progress Notes (Signed)
We are sorry that you are not feeling well. Here is how we plan to help! Based on what you shared with me it looks like you: May have a vaginosis due to bacteria. Trichomoniasis is sexually transmitted. Sex partners would need to be treated as well. Refrain from intercourse for 1 week   Vaginosis is an inflammation of the vagina that can result in discharge, itching and pain. The cause is usually a change in the normal balance of vaginal bacteria or an infection. Vaginosis can also result from reduced estrogen levels after menopause.  The most common causes of vaginosis are:   Bacterial vaginosis which results from an overgrowth of one on several organisms that are normally present in your vagina.   Yeast infections which are caused by a naturally occurring fungus called candida.   Vaginal atrophy (atrophic vaginosis) which results from the thinning of the vagina from reduced estrogen levels after menopause.   Trichomoniasis which is caused by a parasite and is commonly transmitted by sexual intercourse.  Factors that increase your risk of developing vaginosis include: Marland Kitchen. Medications, such as antibiotics and steroids . Uncontrolled diabetes . Use of hygiene products such as bubble bath, vaginal spray or vaginal deodorant . Douching . Wearing damp or tight-fitting clothing . Using an intrauterine device (IUD) for birth control . Hormonal changes, such as those associated with pregnancy, birth control pills or menopause . Sexual activity . Having a sexually transmitted infection  Your treatment plan is Metronidazole or Flagyl 500mg  twice a day for 7 days.  I have electronically sent this prescription into the pharmacy that you have chosen.  Be sure to take all of the medication as directed. Stop taking any medication if you develop a rash, tongue swelling or shortness of breath. Mothers who are breast feeding should consider pumping and discarding their breast milk while on these  antibiotics. However, there is no consensus that infant exposure at these doses would be harmful.  Remember that medication creams can weaken latex condoms. Marland Kitchen.   HOME CARE:  Good hygiene may prevent some types of vaginosis from recurring and may relieve some symptoms:  . Avoid baths, hot tubs and whirlpool spas. Rinse soap from your outer genital area after a shower, and dry the area well to prevent irritation. Don't use scented or harsh soaps, such as those with deodorant or antibacterial action. Marland Kitchen. Avoid irritants. These include scented tampons and pads. . Wipe from front to back after using the toilet. Doing so avoids spreading fecal bacteria to your vagina.  Other things that may help prevent vaginosis include:  Marland Kitchen. Don't douche. Your vagina doesn't require cleansing other than normal bathing. Repetitive douching disrupts the normal organisms that reside in the vagina and can actually increase your risk of vaginal infection. Douching won't clear up a vaginal infection. . Use a latex condom. Both female and female latex condoms may help you avoid infections spread by sexual contact. . Wear cotton underwear. Also wear pantyhose with a cotton crotch. If you feel comfortable without it, skip wearing underwear to bed. Yeast thrives in [employer] Hot[employer]moist environments Your symptoms should improve in the next day or two.  GET HELP RIGHT AWAY IF:  . You have pain in your lower abdomen ( pelvic area or over your ovaries) . You develop nausea or vomiting . You develop a fever . Your discharge changes or worsens . You have persistent pain with intercourse . You develop shortness of breath, a rapid pulse, or you faint.  These symptoms could be signs of problems or infections that need to be evaluated by a medical provider now.  MAKE SURE YOU    Understand these instructions.  Will watch your condition.  Will get help right away if you are not doing well or get worse.  Your e-visit answers were reviewed  by a board certified advanced clinical practitioner to complete your personal care plan. Depending upon the condition, your plan could have included both over the counter or prescription medications. Please review your pharmacy choice to make sure that you have choses a pharmacy that is open for you to pick up any needed prescription, Your safety is important to [place]. If you have drug allergies check your prescription carefully.   You can use MyChart to ask questions about today's visit, request a non-urgent call back, or ask for a work or school excuse for 24 hours related to this e-Visit. If it has been greater than 24 hours you will need to follow up with your provider, or enter a new e-Visit to address those concerns. You will get a MyChart message within the next two days asking about your experience. I hope that your e-visit has been valuable and will speed your recovery.

## 2018-09-08 ENCOUNTER — Other Ambulatory Visit: Payer: Self-pay

## 2018-09-08 ENCOUNTER — Ambulatory Visit (INDEPENDENT_AMBULATORY_CARE_PROVIDER_SITE_OTHER): Payer: 59 | Admitting: Advanced Practice Midwife

## 2018-09-08 ENCOUNTER — Encounter: Payer: Self-pay | Admitting: Advanced Practice Midwife

## 2018-09-08 VITALS — BP 119/74 | HR 66 | Ht 60.0 in | Wt 167.2 lb

## 2018-09-08 DIAGNOSIS — Z113 Encounter for screening for infections with a predominantly sexual mode of transmission: Secondary | ICD-10-CM

## 2018-09-08 DIAGNOSIS — B373 Candidiasis of vulva and vagina: Secondary | ICD-10-CM | POA: Diagnosis not present

## 2018-09-08 DIAGNOSIS — N898 Other specified noninflammatory disorders of vagina: Secondary | ICD-10-CM | POA: Diagnosis not present

## 2018-09-08 NOTE — Progress Notes (Signed)
Pt came in with complaints of vaginal discharge and vaginal itching. Pt states she may have possibly been exposed to an STD. Pt also thinks she possibly has a yeast infection. Pt was instructed on how to do a self swab. Pt repeated instructions given with no questions. Pt was made aware that results can take up to 48 hours to result and that she could also access mychart. Pt was told that she would be contacted regarding treatment plan for results. Pt verbalized understanding and had no further questions.

## 2018-09-08 NOTE — Progress Notes (Signed)
I have reviewed the chart and agree with nursing staff's documentation of this patient's encounter.  Thressa Sheller DNP, CNM  09/08/18  12:08 PM

## 2018-09-09 LAB — CERVICOVAGINAL ANCILLARY ONLY
Bacterial vaginitis: NEGATIVE
Candida vaginitis: POSITIVE — AB
Chlamydia: NEGATIVE
Neisseria Gonorrhea: NEGATIVE
Trichomonas: NEGATIVE

## 2018-09-10 MED ORDER — FLUCONAZOLE 150 MG PO TABS: 150.0000 mg | ORAL_TABLET | Freq: Once | ORAL | 0 refills | Status: AC

## 2018-09-10 NOTE — Addendum Note (Signed)
Addended by: Thressa Sheller D on: 09/10/2018 11:00 AM   Modules accepted: Orders

## 2018-11-12 ENCOUNTER — Other Ambulatory Visit: Payer: Self-pay | Admitting: Family

## 2018-11-13 NOTE — Telephone Encounter (Signed)
Pt has no PCP °

## 2018-12-09 ENCOUNTER — Ambulatory Visit: Payer: 59 | Admitting: Advanced Practice Midwife

## 2018-12-10 ENCOUNTER — Ambulatory Visit (INDEPENDENT_AMBULATORY_CARE_PROVIDER_SITE_OTHER): Payer: 59 | Admitting: Student

## 2018-12-10 ENCOUNTER — Other Ambulatory Visit: Payer: Self-pay

## 2018-12-10 ENCOUNTER — Encounter: Payer: Self-pay | Admitting: Student

## 2018-12-10 VITALS — BP 116/54 | HR 54 | Ht 60.0 in | Wt 163.0 lb

## 2018-12-10 DIAGNOSIS — N760 Acute vaginitis: Secondary | ICD-10-CM

## 2018-12-10 DIAGNOSIS — N939 Abnormal uterine and vaginal bleeding, unspecified: Secondary | ICD-10-CM

## 2018-12-10 DIAGNOSIS — B9689 Other specified bacterial agents as the cause of diseases classified elsewhere: Secondary | ICD-10-CM

## 2018-12-10 DIAGNOSIS — N898 Other specified noninflammatory disorders of vagina: Secondary | ICD-10-CM | POA: Diagnosis not present

## 2018-12-10 DIAGNOSIS — Z113 Encounter for screening for infections with a predominantly sexual mode of transmission: Secondary | ICD-10-CM

## 2018-12-10 MED ORDER — VALACYCLOVIR HCL 1 G PO TABS: 1000.0000 mg | ORAL_TABLET | Freq: Two times a day (BID) | ORAL | 5 refills | Status: DC

## 2018-12-10 NOTE — Progress Notes (Signed)
History:  Ms. Ziomara Birenbaum is a 28 y.o. No obstetric history on file. who presents to clinic today for STI testing and to talk about her irregular periods. She had Nexplanon but got it taken out two years ago. Since January she has started to have frequent periods (like two in one month) and then she skips a cycle. The cramping and bleeding are normal; she does not complain of heavy clots or pain. She cramps are manageable with ibuprofen. She does not want any birth control because she felt like the Nexplanon made her gain weight and her sister had an IUD, which didn't work for her. She says she had a blood clot when she was 12 and so she hasn't been able to take any estrogen-containing methods.   Of note, she has lost 60 lbs in the past two years due to diet and exercise.   She would like a refill on her Valtrex. Denies any recent outbreaks.   The following portions of the patient's history were reviewed and updated as appropriate: allergies, current medications, family history, past medical history, social history, past surgical history and problem list.  Review of Systems:  Review of Systems  Constitutional: Negative.   HENT: Negative.   Respiratory: Negative.   Cardiovascular: Negative.   Genitourinary: Negative.   Skin: Negative.   Neurological: Negative.   Psychiatric/Behavioral: Negative.       Objective:  Physical Exam BP (!) 116/54   Pulse (!) 54   Ht 5' (1.524 m)   Wt 163 lb (73.9 kg)   LMP 12/06/2018 (Exact Date)   BMI 31.83 kg/m  Physical Exam  Constitutional: She is oriented to person, place, and time. She appears well-developed.  HENT:  Head: Normocephalic.  Eyes: Pupils are equal, round, and reactive to light.  Neck: Normal range of motion.  Respiratory: Effort normal.  GI: Soft.  Genitourinary:    Genitourinary Comments: NEFG; no discharge or odor in the vagina. No evidence of yeast or BV. No CMT, suprapubic or adnexal tenderness.    Musculoskeletal: Normal  range of motion.  Neurological: She is alert and oriented to person, place, and time.  Skin: Skin is warm and dry.  Psychiatric: She has a normal mood and affect.      Labs and Imaging No results found for this or any previous visit (from the past 24 hour(s)).  No results found.   Assessment & Plan:  1. Screen for STD (sexually transmitted disease) -RX for Valtrex sent to the pharmacy.  - Cervicovaginal ancillary only( [facility]) - RPR - HIV Antibody (routine testing w rflx) - Hepatitis B Surface AntiGEN - Hepatitis C Antibody  2. Vaginal discharge  - Cervicovaginal ancillary only( [facility])  3. Abnormal uterine bleeding -Will check TSH; offered progesterone only method like IUD or Depo and patient does not want this.  -Will start with Aygestin 5 mg for three months; patient can increase or decrease as needed.    [place]arr [place], [place] 12/10/2018 8:00 PM

## 2018-12-11 ENCOUNTER — Other Ambulatory Visit: Payer: Self-pay | Admitting: Student

## 2018-12-11 LAB — RPR: RPR Ser Ql: NONREACTIVE

## 2018-12-11 LAB — HEPATITIS C ANTIBODY: Hep C Virus Ab: <0.1 s/co ratio (ref 0.0–0.9)

## 2018-12-11 LAB — HIV ANTIBODY (ROUTINE TESTING W REFLEX): HIV Screen 4th Generation wRfx: NONREACTIVE

## 2018-12-11 LAB — TSH: TSH: 1.45 u[IU]/mL (ref 0.450–4.500)

## 2018-12-11 LAB — HEPATITIS B SURFACE ANTIGEN: Hepatitis B Surface Ag: NEGATIVE

## 2018-12-11 MED ORDER — AYGESTIN 5 MG PO TABS: 5.0000 mg | ORAL_TABLET | Freq: Every day | ORAL | 3 refills | Status: DC

## 2018-12-13 LAB — CERVICOVAGINAL ANCILLARY ONLY
Bacterial vaginitis: POSITIVE — AB
Candida vaginitis: NEGATIVE
Chlamydia: NEGATIVE
Neisseria Gonorrhea: NEGATIVE
Trichomonas: NEGATIVE

## 2018-12-16 ENCOUNTER — Other Ambulatory Visit: Payer: Self-pay

## 2018-12-16 ENCOUNTER — Encounter: Payer: Self-pay | Admitting: Allergy & Immunology

## 2018-12-16 ENCOUNTER — Ambulatory Visit (INDEPENDENT_AMBULATORY_CARE_PROVIDER_SITE_OTHER): Payer: 59 | Admitting: Allergy & Immunology

## 2018-12-16 VITALS — BP 98/70 | HR 68 | Temp 97.9°F | Resp 16 | Ht 60.0 in | Wt 164.4 lb

## 2018-12-16 DIAGNOSIS — T7800XA Anaphylactic reaction due to unspecified food, initial encounter: Secondary | ICD-10-CM

## 2018-12-16 DIAGNOSIS — J302 Other seasonal allergic rhinitis: Secondary | ICD-10-CM

## 2018-12-16 DIAGNOSIS — J3089 Other allergic rhinitis: Secondary | ICD-10-CM | POA: Diagnosis not present

## 2018-12-16 DIAGNOSIS — J4540 Moderate persistent asthma, uncomplicated: Secondary | ICD-10-CM

## 2018-12-16 MED ORDER — ALBUTEROL SULFATE HFA 108 (90 BASE) MCG/ACT IN AERS: 4.0000 | INHALATION_SPRAY | RESPIRATORY_TRACT | 1 refills | Status: DC | PRN

## 2018-12-16 MED ORDER — BUDESONIDE-FORMOTEROL FUMARATE 160-4.5 MCG/ACT IN AERO: INHALATION_SPRAY | RESPIRATORY_TRACT | 5 refills | Status: DC

## 2018-12-16 NOTE — Progress Notes (Signed)
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 Pleurisy - recurrent - Stable without worsening course.  - Continue with ibuprofen as needed (four tablets every 8 hours is the max dose).   2. Moderate persistent asthma with current episode of pleurisy - Lung testing looked stable today.  - We are going to go ahead and get some blood work to see if you would qualify for an injectable medication. - Daily controller medication(s): Symbicort 160/4.5 two puffs twice daily with spacer - Rescue medications: ProAir 4 puffs every 4-6 hours as needed or albuterol nebulizer one vial puffs every 4-6 hours as needed - Changes during respiratory infections or worsening symptoms: add Asmanex 160mcg with spacer two puffs twice daily for two weeks (sample provided) - Asthma control goals:  * Full participation in all desired activities (may need albuterol before activity) * Albuterol use two time or less a week on average (not counting use with activity) * Cough interfering with sleep two time or less a month * Oral steroids no more than once a year * No hospitalizations  3. Allergic rhinitis - Continue with Zyrtec (cetirizine) 10mg  daily. - We could consider allergy shots in the future (information provided).   4. Food allergies (shellfish, wheat, and cow's milk) - EpiPen is up to  date.  - Continue to avoid your triggering foods.   5. Urticaria (hives) - Avoidance of certain foods seems to have helped. - Taking Zyrtec (cetirizine) 10mg  daily seems to keep the hives at [place] as well.   6. Return in about 3 months (around 03/18/2019). This can be an in-person, a virtual Webex or a telephone follow up visit.  Subjective:   Danielle Velez is a 28 y.o. female presenting today for follow up of  Chief Complaint  Patient presents with  . Asthma    woke up this morning with chest tightness  . Allergic Rhinitis   . Food Intolerance    no accidental food exposures    Danielle Velez has a history of the following: Patient Active Problem List   Diagnosis Date Noted  . 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 Vaginal discharge 07/30/2018  . Upper respiratory tract infection 06/05/2018  . Seasonal and perennial allergic rhinitis 06/05/2018  . History of herpes labialis 01/22/2018  . Folliculitis 41/32/4401  . Chest pain on breathing 11/11/2017  . Possible pregnancy 11/11/2017  . Adverse food reaction 11/11/2017  . Recurrent pleurisy 07/19/2016  . Moderate persistent asthma 03/22/2015  . Food allergy 03/22/2015  . Moderate persistent asthma with acute exacerbation 03/09/2015  . Allergic rhinitis 03/09/2015    History obtained from: chart review and patient.  Danielle Velez is a 28 y.o. female presenting for a follow up visit.  She was last seen in January 2020.  At that time, she was diagnosed with an asthma exacerbation.  She was started on prednisone and doxycycline.  Her Symbicort 160/4.5 mcg was restarted as well.  She was continued  on Zyrtec 10 mg daily as well as Nasacort 1 to 2 sprays per nostril up to twice daily.  She continued to avoid shellfish.   Since the last visit, she has done fairly well. She does request a letter today starting her allergens so that she can give it to the apartment complex to help her get out of her lease.    Asthma/Respiratory Symptom History: She tells me that her asthma has been "on edge" since April or so. She has noted that there was mold in her apartment. She has had some pest problems as well. She is in the process of moving. She needs something on paper that tells the apartment complex that she should not live around roaches or mold. She does think that this living environment has a lot to do with her current control. She would like to go ahead and move and needs a letter to get out of her lease. She estimates that she has needed prednisone one a handful of occasions.  Allergic Rhinitis Symptom History: Rhinitis continues to be an issue, especially living in her apartment complex.  She has talked to her landlord about the mold and bug issues, and while they have sprayed for bugs on multiple occasions she continues to have issues with this.  She has not needed antibiotics since last time we saw her in the office.  She did receive a course of doxycycline in January from Dr. Selena BattenKim, but otherwise has not needed any antibiotics at all.  She is requesting that we write a letter to her apartment complex (Residence at [phone]).   Food Allergy Symptom History: She continues to avoid peanuts, tree nuts, and shellfish.  There have been no accidental exposures at all.    Chronic Urticaria Symptom History: She did have a problem with chronic urticaria, but once she started avoiding cows milk and wheat, these improved. Her pleurisy has not been too much of an issue.  She has been using ibuprofen 2 tablets at a time as needed.  She does not take this on a daily basis.    Otherwise, there have been no changes to her past medical history, surgical history, family history, or social history.  She is no longer working at [employer].  She now has a job where she can work from home.  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     Review of Systems  Constitutional: Negative.  Negative for fever, malaise/fatigue and weight loss.  HENT: Negative.  Negative for congestion, ear discharge and ear pain.   Eyes: Negative for pain, discharge and redness.  Respiratory: Positive for shortness of breath and wheezing. Negative for cough and sputum production.   Cardiovascular: Negative.  Negative for chest pain and palpitations.  Gastrointestinal: Negative for abdominal pain, heartburn, nausea and vomiting.  Skin: Negative.  Negative for itching and rash.  Neurological: Negative for dizziness and headaches.  Endo/Heme/Allergies: Negative for environmental allergies. Does not bruise/bleed easily.       Objective:   Blood pressure 98/70, pulse 68, temperature 97.9 F (36.6 C), temperature source Temporal, resp. rate 16, height 5' (1.524 m), weight 164 lb 6.4 oz (74.6 kg), last menstrual period 12/06/2018, SpO2 99 %. Body mass index is 32.11 kg/m.   Physical Exam:  Physical Exam  Constitutional: She appears well-developed.  Pleasant female.  HENT:  Head: Normocephalic and atraumatic.  Right Ear: Tympanic membrane, external ear and ear canal normal.  Left Ear: Tympanic membrane, external ear and ear canal normal.  Nose: Mucosal edema and rhinorrhea present. No nasal deformity or septal deviation. No epistaxis. Right sinus exhibits no maxillary sinus tenderness and no frontal sinus tenderness. Left sinus exhibits no maxillary sinus tenderness and no frontal sinus tenderness.  Mouth/Throat: Uvula is midline and oropharynx is clear and moist. Mucous membranes are not pale and not dry.  Eyes: Pupils are equal, round, and reactive to light. Conjunctivae and EOM are normal. Right eye exhibits no chemosis and no discharge. Left eye exhibits no chemosis and no discharge. Right conjunctiva is not injected. Left conjunctiva is not injected.  Cardiovascular:  Normal rate, regular rhythm and normal heart sounds.  Respiratory: Effort normal and breath sounds normal. No accessory muscle usage. No tachypnea. No respiratory distress. She has no wheezes. She has no rhonchi. She has no rales. She exhibits no tenderness.  Moving air well in all lung fields.  Lymphadenopathy:    She has no cervical adenopathy.  Neurological: She is alert.  Skin: No abrasion, no petechiae and no rash noted. Rash is not papular, not vesicular and not urticarial. No erythema. No pallor.  No eczematous or urticarial lesions noted.  Psychiatric: She has a normal mood and affect.     Diagnostic studies:    Spirometry: results normal (FEV1: 2.31/95%, FVC: 2.81/101%, FEV1/FVC: 82%).    Spirometry consistent with normal pattern.   Allergy Studies: none       Malachi BondsJoel Aadvika Konen, MD  [facility]

## 2018-12-16 NOTE — Addendum Note (Signed)
Addended by: Chip Boer R on: 12/16/2018 06:04 PM   Modules accepted: Orders

## 2018-12-16 NOTE — Patient Instructions (Addendum)
1. Pleurisy - recurrent - Stable without worsening course.  - Continue with ibuprofen as needed (four tablets every 8 hours is the max dose).   2. Moderate persistent asthma with current episode of pleurisy - Lung testing looked stable today.  - We are going to go ahead and get some blood work to see if you would qualify for an injectable medication. - Daily controller medication(s): Symbicort 160/4.5 two puffs twice daily with spacer - Rescue medications: ProAir 4 puffs every 4-6 hours as needed or albuterol nebulizer one vial puffs every 4-6 hours as needed - Changes during respiratory infections or worsening symptoms: add Asmanex 100mcg with spacer two puffs twice daily for two weeks (sample provided) - Asthma control goals:  * Full participation in all desired activities (may need albuterol before activity) * Albuterol use two time or less a week on average (not counting use with activity) * Cough interfering with sleep two time or less a month * Oral steroids no more than once a year * No hospitalizations  3. Allergic rhinitis - Continue with Zyrtec (cetirizine) 10mg  daily. - We could consider allergy shots in the future (information provided).   4. Food allergies (shellfish, wheat, and cow's milk) - EpiPen is up to date.  - Continue to avoid your triggering foods.   5. Urticaria (hives) - Avoidance of certain foods seems to have helped. - Taking Zyrtec (cetirizine) 10mg  daily seems to keep the hives at bay as well.   6. Return in about 3 months (around 03/18/2019). This can be an in-person, a virtual Webex or a telephone follow up visit.   Please inform us of any Emergency Department visits, hospitalizations, or changes in symptoms. Call us before going to the ED for breathing or allergy symptoms since we might be able to fit you in for a sick visit. Feel free to contact us anytime with any questions, problems, or concerns.  It was a pleasure to see you again today!  Websites  that have reliable patient information: 1. American Academy of Asthma, Allergy, and Immunology: www.aaaai.org 2. Food Allergy Research and Education (FARE): foodallergy.org 3. Mothers of Asthmatics: http://www.asthmacommunitynetwork.org 4. American College of Allergy, Asthma, and Immunology: www.acaai.org  "Like" us on Facebook and Instagram for our latest updates!      Make sure you are registered to vote! If you have moved or changed any of your contact information, you will need to get this updated before voting!  In some cases, you MAY be able to register to vote online: [email or web address]    Voter ID laws are NOT going into effect for the General Election in November 2020! DO NOT let this stop you from exercising your right to vote!   Absentee voting is the SAFEST way to vote during the coronavirus pandemic!   Download and print an absentee ballot request form at [email or web address] or you can scan the QR code below with your smart phone:      More information on absentee ballots can be found here: [email or web address]    Allergy Shots   Allergies are the result of a chain reaction that starts in the immune system. Your immune system controls how your body defends itself. For instance, if you have an allergy to pollen, your immune system identifies pollen as an invader or allergen. Your immune system overreacts by producing antibodies called Immunoglobulin E (IgE). These antibodies travel to cells that release chemicals, causing an allergic reaction.  The concept behind allergy immunotherapy, whether it is received in  the form of shots or tablets, is that the immune system can be desensitized to specific allergens that trigger allergy symptoms. Although it requires time and patience, the payback can be long-term relief.  How Do Allergy Shots Work?  Allergy shots work much like a vaccine. Your body responds to injected amounts  of a particular allergen given in increasing doses, eventually developing a resistance and tolerance to it. Allergy shots can lead to decreased, minimal or no allergy symptoms.  There generally are two phases: build-up and maintenance. Build-up often ranges from three to six months and involves receiving injections with increasing amounts of the allergens. The shots are typically given once or twice a week, though more rapid build-up schedules are sometimes used.  The maintenance phase begins when the most effective dose is reached. This dose is different for each person, depending on how allergic you are and your response to the build-up injections. Once the maintenance dose is reached, there are longer periods between injections, typically two to four weeks.  Occasionally doctors give cortisone-type shots that can temporarily reduce allergy symptoms. These types of shots are different and should not be confused with allergy immunotherapy shots.  Who Can Be Treated with Allergy Shots?  Allergy shots may be a good treatment approach for people with allergic rhinitis (hay fever), allergic asthma, conjunctivitis (eye allergy) or stinging insect allergy.   Before deciding to begin allergy shots, you should consider:  . The length of allergy season and the severity of your symptoms . Whether medications and/or changes to your environment can control your symptoms . Your desire to avoid long-term medication use . Time: allergy immunotherapy requires a major time commitment . Cost: may vary depending on your insurance coverage  Allergy shots for children age 24five and older are effective and often well tolerated. They might prevent the onset of new allergen sensitivities or the progression to asthma.  Allergy shots are not started on patients who are pregnant but can be continued on patients who become pregnant while receiving them. In some patients with other medical conditions or who take certain  common medications, allergy shots may be of risk. It is important to mention other medications you talk to your allergist.   When Will I Feel Better?  Some may experience decreased allergy symptoms during the build-up phase. For others, it may take as long as 12 months on the maintenance dose. If there is no improvement after a year of maintenance, your allergist will discuss other treatment options with you.  If you aren't responding to allergy shots, it may be because there is not enough dose of the allergen in your vaccine or there are missing allergens that were not identified during your allergy testing. Other reasons could be that there are high levels of the allergen in your environment or major exposure to non-allergic triggers like tobacco smoke.  What Is the Length of Treatment?  Once the maintenance dose is reached, allergy shots are generally continued for three to five years. The decision to stop should be discussed with your allergist at that time. Some people may experience a permanent reduction of allergy symptoms. Others may relapse and a longer course of allergy shots can be considered.  What Are the Possible Reactions?  The two types of adverse reactions that can occur with allergy shots are local and systemic. Common local reactions include very mild redness and swelling at the injection site, which can happen immediately or several hours after. A systemic reaction, which  is less common, affects the entire body or a particular body system. They are usually mild and typically respond quickly to medications. Signs include increased allergy symptoms such as sneezing, a stuffy nose or hives.  Rarely, a serious systemic reaction called anaphylaxis can develop. Symptoms include swelling in the throat, wheezing, a feeling of tightness in the chest, nausea or dizziness. Most serious systemic reactions develop within 30 minutes of allergy shots. This is why it is strongly recommended you  wait in your doctor's office for 30 minutes after your injections. Your allergist is trained to watch for reactions, and his or her staff is trained and equipped with the proper medications to identify and treat them.  Who Should Administer Allergy Shots?  The preferred location for receiving shots is your prescribing allergist's office. Injections can sometimes be given at another facility where the physician and staff are trained to recognize and treat reactions, and have received instructions by your prescribing allergist.

## 2018-12-17 ENCOUNTER — Other Ambulatory Visit: Payer: Self-pay | Admitting: Student

## 2018-12-17 MED ORDER — METRONIDAZOLE 500 MG PO TABS: 500.0000 mg | ORAL_TABLET | Freq: Two times a day (BID) | ORAL | 0 refills | Status: DC

## 2018-12-17 MED ORDER — FLUCONAZOLE 150 MG PO TABS: 150.0000 mg | ORAL_TABLET | Freq: Once | ORAL | 1 refills | Status: AC

## 2018-12-17 NOTE — Progress Notes (Signed)
Called patient advised letter is ready to be pick up and left up front. Patient verbalized understanding

## 2018-12-18 LAB — CBC WITH DIFFERENTIAL/PLATELET
Basophils Absolute: 0 10*3/uL (ref 0.0–0.2)
Basos: 1 %
EOS (ABSOLUTE): 0.1 10*3/uL (ref 0.0–0.4)
Eos: 3 %
Hematocrit: 40.6 % (ref 34.0–46.6)
Hemoglobin: 13.5 g/dL (ref 11.1–15.9)
Immature Grans (Abs): 0 10*3/uL (ref 0.0–0.1)
Immature Granulocytes: 0 %
Lymphocytes Absolute: 1.8 10*3/uL (ref 0.7–3.1)
Lymphs: 44 %
MCH: 27.6 pg (ref 26.6–33.0)
MCHC: 33.3 g/dL (ref 31.5–35.7)
MCV: 83 fL (ref 79–97)
Monocytes Absolute: 0.3 10*3/uL (ref 0.1–0.9)
Monocytes: 7 %
Neutrophils Absolute: 1.9 10*3/uL (ref 1.4–7.0)
Neutrophils: 45 %
Platelets: 313 10*3/uL (ref 150–450)
RBC: 4.9 x10E6/uL (ref 3.77–5.28)
RDW: 12.8 % (ref 11.7–15.4)
WBC: 4.1 10*3/uL (ref 3.4–10.8)

## 2018-12-18 LAB — IGE: IgE (Immunoglobulin E), Serum: 79 IU/mL (ref 6–495)

## 2019-01-21 ENCOUNTER — Other Ambulatory Visit: Payer: Self-pay

## 2019-01-21 ENCOUNTER — Ambulatory Visit (INDEPENDENT_AMBULATORY_CARE_PROVIDER_SITE_OTHER): Payer: 59 | Admitting: Allergy & Immunology

## 2019-01-21 ENCOUNTER — Encounter: Payer: Self-pay | Admitting: Allergy & Immunology

## 2019-01-21 ENCOUNTER — Telehealth: Payer: Self-pay

## 2019-01-21 DIAGNOSIS — T7800XA Anaphylactic reaction due to unspecified food, initial encounter: Secondary | ICD-10-CM | POA: Diagnosis not present

## 2019-01-21 DIAGNOSIS — L501 Idiopathic urticaria: Secondary | ICD-10-CM

## 2019-01-21 DIAGNOSIS — J302 Other seasonal allergic rhinitis: Secondary | ICD-10-CM

## 2019-01-21 DIAGNOSIS — J3089 Other allergic rhinitis: Secondary | ICD-10-CM | POA: Diagnosis not present

## 2019-01-21 DIAGNOSIS — J4541 Moderate persistent asthma with (acute) exacerbation: Secondary | ICD-10-CM | POA: Diagnosis not present

## 2019-01-21 DIAGNOSIS — R091 Pleurisy: Secondary | ICD-10-CM

## 2019-01-21 MED ORDER — ALBUTEROL SULFATE HFA 108 (90 BASE) MCG/ACT IN AERS: 4.0000 | INHALATION_SPRAY | RESPIRATORY_TRACT | 1 refills | Status: DC | PRN

## 2019-01-21 MED ORDER — PREDNISONE 10 MG PO TABS: ORAL_TABLET | ORAL | 0 refills | Status: DC

## 2019-01-21 MED ORDER — ALBUTEROL SULFATE (2.5 MG/3ML) 0.083% IN NEBU: 2.5000 mg | INHALATION_SOLUTION | Freq: Four times a day (QID) | RESPIRATORY_TRACT | 0 refills | Status: DC | PRN

## 2019-01-21 MED ORDER — BUDESONIDE-FORMOTEROL FUMARATE 160-4.5 MCG/ACT IN AERO: INHALATION_SPRAY | RESPIRATORY_TRACT | 5 refills | Status: DC

## 2019-01-21 NOTE — Progress Notes (Signed)
RE: Danielle Velez MRN: [id number] DOB: 11/10/1990 Date of Telemedicine Visit: 01/21/2019  Referring provider: No ref. provider found Primary care provider: Patient, No Pcp Per  Chief Complaint: Urticaria and Asthma   Telemedicine Follow Up Visit via Telephone: I connected with Danielle Velez for a follow up on 01/22/19 by telephone and verified that I am speaking with the correct person using two identifiers.   I discussed the limitations, risks, security and privacy concerns of performing an evaluation and management service by telephone and the availability of in person appointments. I also discussed with the patient that there may be a patient responsible charge related to this service. The patient expressed understanding and agreed to proceed.  Patient is at home.  Provider is at the office.  Visit start time: 3:50 PM Visit end time: 4:15 PM [facility] and medical assistant/nurse: Kayla  History of Present Illness:  She is a 28 y.o. female, who is being followed for moderate persistent asthma as well as idiopathic urticaria, allergic rhinitis, food allergies, and recurrent pleurisy. Her previous allergy office visit was in August 2020 with myself.  At that time, her pleurisy was stable with ibuprofen as needed.  Her asthma appears stable as well.  We continued with Symbicort 160/4.5 mcg 2 puffs twice daily as well as albuterol as needed.  She has Asmanex 100 mcg to use during respiratory flares.  We did obtain some blood work to see if she will qualify for an injectable medication for asthma.  She did qualify for Xolair and approval was obtained as of August 10.  However, it does not seem that she has showed up to start her injections. For her allergic rhinitis, we continued with cetirizine as needed. We also recommended continued avoidance of shellfish, wheat, and cows milk.  For her hives, she seemed to have controlled them with avoidance of  certain foods.  We recommended cetirizine 10 mg daily to keep the hives at bay.  Since last visit, she has mostly done well.  However, she awoke at 2 AM with shortness of breath and wheezing.  She reports chest tightness as well.  She did use her albuterol with transient improvement.  She has used her albuterol on a number of occasions since 2 AM.  Last night for dinner, she had a [place] sandwich which is not unusual for her.  She also had egg whites for breakfast this morning.  She did try taking 2 ibuprofen because of the chest pain, which did not help much.  She has had no fevers.  The albuterol does help her to take deeper breaths.    She denies a history of pulmonary embolisms in her adult years. However, she thinks that she did have a blood clot in her lung when she was "really young". She is unsure of the details of this. She has otherwise never had any blood clotting problems. She was placed in the hospital for a period of time. But she never had to have surgery. She is going to start birth control in October (either pill or IUD). She is not a smoker. There are is no family history of blood clots. She is not on blood thinners at this time.   Her current pain is not entirely consistent with her typical episodes of pleurisy. She feels that this is more of an intrinsic pulmonary issue. Typically the albuterol does not help with the pleurisy, but with this current episode, the albuterol does seem to  be helping.   She is requesting a new nebulizer machine. Apparently, her current machine ended up having mold which grew up into her machine. She is no longer living at that apartment, but at a friend's house instead. She is working on getting out of her lease due to the mold that has now covered her entire apartment of furniture.   Otherwise, there have been no changes to her past medical history, surgical history, family history, or social history.  Assessment and Plan:  Danielle Velez is a 28 y.o. 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 Pleurisy - recurrent - Stable without worsening course.  - Continue with ibuprofen as needed (four tablets every 8 hours is the max dose).   2. Moderate persistent asthma with exacerbation - Start the prednisone taper sent in today.  - Come by the office tomorrow and we will get you a new nebulizer and possibly your Xolair.  - Daily controller medication(s): Symbicort 160/4.5 two puffs twice daily with spacer - Rescue medications: ProAir 4 puffs every 4-6 hours as needed or albuterol nebulizer one vial puffs every 4-6 hours as needed - Changes during respiratory infections or worsening symptoms: add Asmanex 134mcg with spacer two puffs twice daily for two weeks (sample provided) - Asthma control goals:  * Full participation in all desired activities (may need albuterol before activity) * Albuterol use two time or less a week on average (not counting use with activity) * Cough interfering with sleep two time or less a month * Oral steroids no more than once a year * No hospitalizations  3. Allergic rhinitis - Continue with Zyrtec (cetirizine) 10mg  daily. - We could consider allergy shots in the future (information provided).   4. Food allergies (shellfish, wheat, and cow's milk) - EpiPen is up to date.  - Continue to avoid your triggering foods.   5. Urticaria (hives) - Avoidance of certain foods seems to have helped. - Taking Zyrtec (cetirizine) 10mg  daily seems to keep the hives at [place] as well.   6. Follow up in three months or earlier if needed.    Diagnostics: None.  Medication  List:  Current Outpatient Medications  Medication Sig Dispense Refill  . albuterol (VENTOLIN HFA) 108 (90 Base) MCG/ACT inhaler Inhale 4 puffs into the lungs every 4 (four) hours as needed for wheezing or shortness of breath (cough). 18 g 1  . budesonide-formoterol (SYMBICORT) 160-4.5 MCG/ACT inhaler INHALE 2 PUFFS INTO THE LUNGS 2 (TWO) TIMES DAILY 10.2 Inhaler 5  . cetirizine (ZYRTEC) 10 MG tablet Take 10 mg by mouth daily.    . metroNIDAZOLE (FLAGYL) 500 MG tablet Take 1 tablet (500 mg total) by mouth 2 (two) times daily. 14 tablet 0  . valACYclovir (VALTREX) 1000 MG tablet Take 1 tablet (1,000 mg total) by mouth 2 (two) times daily. 10 tablet 5  . albuterol (PROVENTIL) (2.5 MG/3ML) 0.083% nebulizer solution Take 3 mLs (2.5 mg total) by nebulization every 6 (six) hours as needed for wheezing or shortness of breath. 75 mL 0  . predniSONE (DELTASONE) 10 MG tablet Take 3 tabs (30mg ) twice daily for 3 days, then 2 tabs (20mg ) twice daily for 3 days, then 1  tab (10mg ) twice daily for 3 days, then STOP. 26 tablet 0   Current Facility-Administered Medications  Medication Dose Route Frequency Provider Last Rate Last Dose  . omalizumab Geoffry Paradise) injection 300 mg  300 mg Subcutaneous Q28 days Alfonse Spruce, MD   300 mg at 07/18/17 1448   Allergies: Allergies  Allergen Reactions  . Effexor [Venlafaxine] Other (See Comments)    Shaky   . Other Hives    DAIRY and WHEAT.  REACTION HIVES AND ITCHING.  Marland Kitchen Shellfish Allergy Itching and Swelling   I reviewed her past medical history, social history, family history, and environmental history and no significant changes have been reported from previous visits.  Review of Systems  Constitutional: Negative for activity change, appetite change, chills, diaphoresis, fatigue and fever.  HENT: Negative for congestion, postnasal drip, rhinorrhea, sinus pressure, sinus pain and sore throat.   Eyes: Negative for pain, discharge, redness and itching.   Respiratory: Positive for chest tightness, shortness of breath and wheezing. Negative for cough, choking and stridor.   Gastrointestinal: Negative for diarrhea, nausea and vomiting.  Endocrine: Negative for cold intolerance and heat intolerance.  Musculoskeletal: Negative for arthralgias, joint swelling and myalgias.  Skin: Negative for rash.  Allergic/Immunologic: Positive for environmental allergies and food allergies.    Objective:  Physical exam not obtained as encounter was done via telephone.   Previous notes and tests were reviewed.  I discussed the assessment and treatment plan with the patient. The patient was provided an opportunity to ask questions and all were answered. The patient agreed with the plan and demonstrated an understanding of the instructions.   The patient was advised to call back or seek an in-person evaluation if the symptoms worsen or if the condition fails to improve as anticipated.  I provided 25 minutes of non-face-to-face time during this encounter.  It was my pleasure to participate in Worthington Brayboy's care today. Please feel free to contact me with any questions or concerns.   Sincerely,  Alfonse Spruce, MD

## 2019-01-21 NOTE — Patient Instructions (Addendum)
1. Pleurisy - recurrent - Stable without worsening course.  - Continue with ibuprofen as needed (four tablets every 8 hours is the max dose).   2. Moderate persistent asthma with current episode of pleurisy - Start the prednisone taper.  - We are going to go ahead and get some blood work to see if you would qualify for an injectable medication. - Daily controller medication(s): Symbicort 160/4.5 two puffs twice daily with spacer - Rescue medications: ProAir 4 puffs every 4-6 hours as needed or albuterol nebulizer one vial puffs every 4-6 hours as needed - Changes during respiratory infections or worsening symptoms: add Asmanex 175mcg with spacer two puffs twice daily for two weeks (sample provided) - Asthma control goals:  * Full participation in all desired activities (may need albuterol before activity) * Albuterol use two time or less a week on average (not counting use with activity) * Cough interfering with sleep two time or less a month * Oral steroids no more than once a year * No hospitalizations  3. Allergic rhinitis - Continue with Zyrtec (cetirizine) 10mg  daily. - We could consider allergy shots in the future (information provided).   4. Food allergies (shellfish, wheat, and cow's milk) - EpiPen is up to date.  - Continue to avoid your triggering foods.   5. Urticaria (hives) - Avoidance of certain foods seems to have helped. - Taking Zyrtec (cetirizine) 10mg  daily seems to keep the hives at [place] as well.   6. No follow-ups on file. This can be an in-person, a virtual Webex or a telephone follow up visit.   Please inform us of any Emergency Department visits, hospitalizations, or changes in symptoms. Call us before going to the ED for breathing or allergy symptoms since we might be able to fit you in for a sick visit. Feel free to contact us anytime with any questions, problems, or concerns.  It was a pleasure to talk to you today today!  Websites that have reliable  patient information: 1. American Academy of Asthma, Allergy, and Immunology: www.aaaai.org 2. Food Allergy Research and Education (FARE): foodallergy.org 3. Mothers of Asthmatics: http://www.asthmacommunitynetwork.org 4. American College of Allergy, Asthma, and Immunology: www.acaai.org  "Like" [place] on Facebook and Instagram for our latest updates!      Make sure you are registered to vote! If you have moved or changed any of your contact information, you will need to get this updated before voting!  In some cases, you MAY be able to register to vote online: [email or web address]    Voter ID laws are NOT going into effect for the General Election in November 2020! DO NOT let this stop you from exercising your right to vote!   Absentee voting is the SAFEST way to vote during the coronavirus pandemic!   Download and print an absentee ballot request form at [email or web address] or you can scan the QR code below with your smart phone:      More information on absentee ballots can be found here: [email or web address]

## 2019-01-21 NOTE — Telephone Encounter (Signed)
Pt is calling today states that she had a bad asthma attack this morning.  SOB, chest tightness, wheezing really bad. Used her albuterol at least 3 times and used her Symbicort, does not have a spacer.  Also, offered her to come and pick up a spacer in clinic today.  Used ibuprofen because chest was hurting really bad.  Also, has really large welts and hives, one is on the back of her leg, in between her thighs, arms.  Scalp is itching really bad. Has not taken zyrtec today, recommended taking zyrtec to help with her hives. Also, recommended per last visit note that she could use her albuterol 4 puffs every 4 hours prn for symptoms and the Asmanex.  Offered a telephone visit today @ 1545.  Pt also asking about her Xolair for her hives.  Xolair is not here yet, but when received she will be notified by Lynelle Smoke or [place] office to schedule an appointment.  Pt okay with plan today to talk to Dr Ernst Bowler via telephone visit.

## 2019-01-22 ENCOUNTER — Encounter: Payer: Self-pay | Admitting: Allergy & Immunology

## 2019-02-26 ENCOUNTER — Other Ambulatory Visit: Payer: Self-pay

## 2019-02-26 ENCOUNTER — Ambulatory Visit (INDEPENDENT_AMBULATORY_CARE_PROVIDER_SITE_OTHER): Payer: 59

## 2019-02-26 ENCOUNTER — Telehealth: Payer: Self-pay | Admitting: *Deleted

## 2019-02-26 DIAGNOSIS — L501 Idiopathic urticaria: Secondary | ICD-10-CM

## 2019-02-26 MED ORDER — EPINEPHRINE 0.3 MG/0.3ML IJ SOAJ: 0.3000 mg | Freq: Once | INTRAMUSCULAR | 1 refills | Status: AC

## 2019-02-26 NOTE — Telephone Encounter (Signed)
Patient called to question her Xolair dosing. I advised her per wgt and IgE her dose is 150mg  but she advised her asthma okay it is her hives that she is having problems with.  I told her I would reach out and see if ok to get approval for 300mg  dose instead of asthma dosage.  It may have been my error I had looked at her encounter and it had advised her hives were under control so I assumed it was for her asthma

## 2019-02-26 NOTE — Progress Notes (Signed)
Immunotherapy   Patient Details  Name: Danielle Velez MRN: [id number] Date of Birth: [birth year]  02/26/2019  Danielle Velez Patient started Xolair 150 mg injections  Frequency: every 4 weeks  Epi-Pen:Prescription for Epi-Pen given Consent signed and patient instructions given. Patient did not wait the 30 mins on restart due to work schedule conflict with waiting. Consulted Dr. Ernst Bowler and he stated patient must call back to notify us her status if she is doing okay.    Festus Holts [name] 02/26/2019, 5:25 PM

## 2019-02-27 NOTE — Telephone Encounter (Signed)
My apologies.  I probably was not very clear.  Let us go with a 300 mg dosing.  Salvatore Marvel, MD Allergy and [place] [facility] [place]

## 2019-03-03 NOTE — Telephone Encounter (Signed)
Will submit for dose increase and urticaria diagnosis

## 2019-03-19 ENCOUNTER — Ambulatory Visit: Payer: 59 | Admitting: Allergy & Immunology

## 2019-03-26 ENCOUNTER — Ambulatory Visit: Payer: 59

## 2019-04-21 ENCOUNTER — Telehealth: Payer: Self-pay | Admitting: Lactation Services

## 2019-04-21 MED ORDER — FLUCONAZOLE 150 MG PO TABS: 150.0000 mg | ORAL_TABLET | Freq: Once | ORAL | 0 refills | Status: AC

## 2019-04-21 NOTE — Telephone Encounter (Signed)
Called pt to obtain more information on her questions and concerns.   Pt reports she is having severe itching in her vaginal area. She went to the doctor and had an US done. She reports she had a Cyst on Ovary and fluid in Fallopian tube per the [facility].    She reports she has clumpy thick discharge. She is having pain with intercourse last night. She reports her nipples have been sore for 2 weeks. She has irregular periods. She does not think she is pregnant.   She reports she went to [facility] and was recently tested for STD and was negative last Monday. Last Friday she had positive UTI and was prescribed ATB for UTI. She did not finish the medication as she was concerned about the . She does not have a new partner. She was treated with Doxycycline Pharmacist told her not to take as it may overpower the Amoxicillin, Metronidazole (2 weeks), and was treated for suspected Chlamydia (Amoxicillin x 5 days).   She reports she stopped all the medications that she was given. Discussed it is not abnormal to get an yeast infection after taking ATB. Diflucan prescription sent to pharmacy. Pt to take one Diflucan today and another one after she finishes her ATB. Discussed with pt is it important that she take the ATB to clear up her infections. Recommended pt consider Probiotics also.

## 2019-05-17 ENCOUNTER — Other Ambulatory Visit: Payer: Self-pay

## 2019-05-17 ENCOUNTER — Emergency Department (HOSPITAL_COMMUNITY)
Admission: EM | Admit: 2019-05-17 | Discharge: 2019-05-17 | Disposition: A | Discharge status: Home / Self Care | Payer: 59 | Attending: Emergency Medicine | Admitting: Emergency Medicine

## 2019-05-17 ENCOUNTER — Emergency Department (HOSPITAL_COMMUNITY): Payer: 59

## 2019-05-17 ENCOUNTER — Other Ambulatory Visit: Payer: Self-pay | Admitting: Allergy & Immunology

## 2019-05-17 ENCOUNTER — Encounter (HOSPITAL_COMMUNITY): Payer: Self-pay | Admitting: Emergency Medicine

## 2019-05-17 DIAGNOSIS — R0602 Shortness of breath: Secondary | ICD-10-CM | POA: Diagnosis present

## 2019-05-17 DIAGNOSIS — R0789 Other chest pain: Secondary | ICD-10-CM | POA: Insufficient documentation

## 2019-05-17 DIAGNOSIS — Z5321 Procedure and treatment not carried out due to patient leaving prior to being seen by health care provider: Secondary | ICD-10-CM | POA: Diagnosis not present

## 2019-05-17 NOTE — ED Triage Notes (Signed)
Per pt, states she has a history of asthma-states inhaler and neb treatments are not working-states her sister's BF tested positive for covid-states chest pain due to pleurisy

## 2019-05-19 ENCOUNTER — Ambulatory Visit: Payer: 59 | Admitting: Allergy & Immunology

## 2019-05-19 ENCOUNTER — Other Ambulatory Visit: Payer: Self-pay

## 2019-05-19 ENCOUNTER — Encounter: Payer: Self-pay | Admitting: Allergy & Immunology

## 2019-05-19 VITALS — Temp 98.4°F

## 2019-05-19 DIAGNOSIS — J4541 Moderate persistent asthma with (acute) exacerbation: Secondary | ICD-10-CM

## 2019-05-19 DIAGNOSIS — J3089 Other allergic rhinitis: Secondary | ICD-10-CM | POA: Diagnosis not present

## 2019-05-19 DIAGNOSIS — L501 Idiopathic urticaria: Secondary | ICD-10-CM | POA: Diagnosis not present

## 2019-05-19 DIAGNOSIS — T7800XA Anaphylactic reaction due to unspecified food, initial encounter: Secondary | ICD-10-CM

## 2019-05-19 DIAGNOSIS — R091 Pleurisy: Secondary | ICD-10-CM

## 2019-05-19 DIAGNOSIS — J302 Other seasonal allergic rhinitis: Secondary | ICD-10-CM

## 2019-05-19 MED ORDER — ALBUTEROL SULFATE HFA 108 (90 BASE) MCG/ACT IN AERS: 2.0000 | INHALATION_SPRAY | Freq: Four times a day (QID) | RESPIRATORY_TRACT | 0 refills | Status: DC | PRN

## 2019-05-19 MED ORDER — PREDNISONE 10 MG PO TABS: ORAL_TABLET | ORAL | 0 refills | Status: DC

## 2019-05-19 MED ORDER — ALBUTEROL SULFATE (2.5 MG/3ML) 0.083% IN NEBU: 2.5000 mg | INHALATION_SOLUTION | Freq: Four times a day (QID) | RESPIRATORY_TRACT | 0 refills | Status: DC | PRN

## 2019-05-19 MED ORDER — BUDESONIDE-FORMOTEROL FUMARATE 160-4.5 MCG/ACT IN AERO: INHALATION_SPRAY | RESPIRATORY_TRACT | 5 refills | Status: DC

## 2019-05-19 MED ORDER — CETIRIZINE HCL 10 MG PO TABS: 10.0000 mg | ORAL_TABLET | Freq: Every day | ORAL | 5 refills | Status: DC

## 2019-05-19 NOTE — Progress Notes (Signed)
RE: Danielle Velez MRN: [id number] DOB: 07-31-1990 Date of Telemedicine Visit: 05/19/2019  Referring provider: No ref. provider found Primary care provider: Patient, No Pcp Per  Chief Complaint: Asthma (asthma, having a hard time breathing, but was exposed brother in law that was positive for covid 19) and Urticaria   Telemedicine Follow Up Visit via Telephone: I connected with Danielle Velez for a follow up on 05/19/19 by telephone and verified that I am speaking with the correct person using two identifiers.   I discussed the limitations, risks, security and privacy concerns of performing an evaluation and management service by telephone and the availability of in person appointments. I also discussed with the patient that there may be a patient responsible charge related to this service. The patient expressed understanding and agreed to proceed.  Patient is in the car accompanied by  who provided/contributed to the history.  Provider is at the office.  Visit start time: 2:57 PM Visit end time: 3:24 PM Insurance consent/check in by: [facility] Medical consent and medical assistant/nurse: Darreld Mclean  History of Present Illness:  She is a 29 y.o. female, who is being followed for moderate persistent asthma as well as idiopathic urticaria, allergic rhinitis, food allergies, and recurrent pleurisy. Her previous allergy office visit was in September 2020 with myself.  We last saw her in September 2020 over the phone.  At that time, her pleurisy was stable.  We did diagnose her with an asthma exacerbation.  We sent in a prednisone taper and started her on Xolair.  We continued Symbicort 160/4.5 mcg 2 puffs twice daily with Asmanex added during flares.  For her allergic rhinitis, continued Zyrtec daily.   She did start Xolair injections and October 2020, but seems to have not followed up after that. She tells me today that she "never received a call" to make another appointment. Evidently there was some  loss of insurance for a period of time as well, but she now has her insurance reinstated.   Since the last visit, she has done well. She went to the ED on January 3 and had a chest x-ray that was normal. She went to the ED because of her classic chest pain, like her other pleuritic episodes. She has not had prednisone since September 2020. She did talk to her mother who had prednisone and is taking 5mg  daily. She takes 4-5 for it to work. She has not had a fever with this. She did not receive prednisone in the ED because she did not stay long enough. She describes it as a "mad house".   Her sister's boyfriend was diagnosed with COVID-19 2-3 days ago. She was tested yesterday just to be on the safe side. This was done on [facility] at [facility]. This is still pending evidently. We sent her to her car once we found out that information for a virtual visit.  She feels that her pain right now is related to pleurisy, but it has not been responsive to ibuprofen like it normally is. She did ger her hands on some prednisone from her mother, but is only taking 10mg  daily which is clearly not doing the trick. She is wondering whether we could send some in for her. She last needed prednisone in September 2020. She denies fever, cough, or other infectious symptoms.  Asthma/Respiratory Symptom History: She was started on Xolair in October 2020. She did not stay that day. Ever since then, she has done well. She is open to getting those once  again. She is also on the Symbicort two puffs twice daily. She has not been using her rescue inhaler in quite some time. Danielle Velez's asthma has been well controlled. She has not required rescue medication, experienced nocturnal awakenings due to lower respiratory symptoms, nor have activities of daily living been limited. She has required no Emergency Department or Urgent Care visits for her asthma. She has required zero courses of systemic steroids for asthma exacerbations since the  last visit. ACT score today is 20, indicating excellent asthma symptom control.   Allergic Rhinitis Symptom History: She remains on the cetirizine daily. She does have a nose spray, which she does not use often at all. She has not needed antibiotics at all.  Food Allergy Symptom History: She continues to avoid shellfish, wheat, as well as cow's milk. She has had no accidental ingestions. EpiPen is up to date.   Otherwise, there have been no changes to her past medical history, surgical history, family history, or social history.  Assessment and Plan:  Danielle Velez is a 29 y.o. 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 Reassuringly, her CXR was normal. We have been concerned with PEs in the past, but she is not having the shortness of breath that would be more common with this etiology. She has yet to get her ANA and other autoimmune workup, but we will discuss that at the next visit when she can be in the clinic in person. We are going to restart the Xolair to help with her urticaria and asthma. She made an appointment in two weeks for her Xolair injection. I will route the note to Tammy to make sure that she has everything she needs to keep her Xolair approved.     Diagnostics: None.  Medication List:  Current Outpatient Medications  Medication Sig Dispense Refill  . albuterol (PROVENTIL) (2.5 MG/3ML) 0.083% nebulizer solution Take 3 mLs (2.5 mg total) by nebulization every 6 (six) hours as needed for wheezing or shortness of breath. 75 mL 0  . albuterol (VENTOLIN HFA) 108 (90  Base) MCG/ACT inhaler INHALE 4 PUFFS INTO THE LUNGS EVERY 4 HOURS AS NEEDED FOR WHEEZING OR SHORTNESS OF BREATH (COUGH). 8 g 0  . budesonide-formoterol (SYMBICORT) 160-4.5 MCG/ACT inhaler INHALE 2 PUFFS INTO THE LUNGS 2 (TWO) TIMES DAILY 10.2 Inhaler 5  . cetirizine (ZYRTEC) 10 MG tablet Take 10 mg by mouth daily.    . metroNIDAZOLE (FLAGYL) 500 MG tablet Take 1 tablet (500 mg total) by mouth 2 (two) times daily. 14 tablet 0  . valACYclovir (VALTREX) 1000 MG tablet Take 1 tablet (1,000 mg total) by mouth 2 (two) times daily. 10 tablet 5   Current Facility-Administered Medications  Medication Dose Route Frequency Provider Last Rate Last Admin  . omalizumab Spero Geralds) injection 300 mg  300 mg Subcutaneous Q28 days Geoffry Paradise, MD   300 mg at 02/26/19 1716   Allergies: Allergies  Allergen Reactions  . Effexor [Venlafaxine] Other (See Comments)    Shaky   . Other Hives    DAIRY and WHEAT.  REACTION HIVES AND ITCHING.  02/28/19 Shellfish Allergy Itching and Swelling   I reviewed her  past medical history, social history, family history, and environmental history and no significant changes have been reported from previous visits.  Review of Systems  Constitutional: Negative.  Negative for chills, diaphoresis, fatigue and fever.  HENT: Negative.  Negative for congestion, ear discharge, ear pain and mouth sores.   Eyes: Negative for pain, discharge and redness.  Respiratory: Negative for cough, shortness of breath and wheezing.        Positive for chest pain.  Cardiovascular: Negative.  Negative for chest pain and palpitations.  Gastrointestinal: Negative for abdominal pain.  Endocrine: Negative for cold intolerance and heat intolerance.  Skin: Negative.  Negative for rash.  Allergic/Immunologic: Negative for environmental allergies.  Neurological: Negative for dizziness and headaches.  Hematological: Does not bruise/bleed easily.    Objective:  Physical exam not obtained as encounter  was done via telephone.   Previous notes and tests were reviewed.  I discussed the assessment and treatment plan with the patient. The patient was provided an opportunity to ask questions and all were answered. The patient agreed with the plan and demonstrated an understanding of the instructions.   The patient was advised to call back or seek an in-person evaluation if the symptoms worsen or if the condition fails to improve as anticipated.  I provided 27 minutes of non-face-to-face time during this encounter.  It was my pleasure to participate in [place] care today. Please feel free to contact me with any questions or concerns.   Sincerely,  Valentina Shaggy, MD

## 2019-05-20 ENCOUNTER — Encounter: Payer: Self-pay | Admitting: Allergy & Immunology

## 2019-05-22 ENCOUNTER — Ambulatory Visit: Payer: 59

## 2019-05-31 ENCOUNTER — Other Ambulatory Visit: Payer: Self-pay | Admitting: Allergy & Immunology

## 2019-06-02 ENCOUNTER — Ambulatory Visit (INDEPENDENT_AMBULATORY_CARE_PROVIDER_SITE_OTHER): Payer: 59

## 2019-06-02 ENCOUNTER — Other Ambulatory Visit: Payer: Self-pay

## 2019-06-02 DIAGNOSIS — L501 Idiopathic urticaria: Secondary | ICD-10-CM

## 2019-06-30 ENCOUNTER — Ambulatory Visit: Payer: 59

## 2019-07-06 ENCOUNTER — Other Ambulatory Visit: Payer: Self-pay | Admitting: *Deleted

## 2019-07-06 MED ORDER — ALBUTEROL SULFATE HFA 108 (90 BASE) MCG/ACT IN AERS: INHALATION_SPRAY | RESPIRATORY_TRACT | 1 refills | Status: DC

## 2019-08-06 ENCOUNTER — Other Ambulatory Visit: Payer: Self-pay | Admitting: Allergy & Immunology

## 2019-09-07 ENCOUNTER — Ambulatory Visit: Payer: 59

## 2019-09-08 ENCOUNTER — Other Ambulatory Visit (HOSPITAL_COMMUNITY)
Admission: RE | Admit: 2019-09-08 | Discharge: 2019-09-08 | Disposition: A | Discharge status: Home / Self Care | Payer: 59 | Source: Ambulatory Visit | Attending: Family Medicine | Admitting: Family Medicine

## 2019-09-08 ENCOUNTER — Ambulatory Visit (INDEPENDENT_AMBULATORY_CARE_PROVIDER_SITE_OTHER): Payer: 59 | Admitting: *Deleted

## 2019-09-08 ENCOUNTER — Encounter: Payer: Self-pay | Admitting: *Deleted

## 2019-09-08 ENCOUNTER — Other Ambulatory Visit: Payer: Self-pay

## 2019-09-08 VITALS — BP 108/64 | HR 59 | Temp 98.9°F | Ht 60.0 in | Wt 176.4 lb

## 2019-09-08 DIAGNOSIS — N898 Other specified noninflammatory disorders of vagina: Secondary | ICD-10-CM

## 2019-09-08 NOTE — Progress Notes (Addendum)
Pt reports having clear vaginal d/c with odor x1.5 wks.  She has no irritation or itching however does have some LLQ cramping. Self vaginal swab obtained and sent to lab. Pt advised that she will be notified of test results and treatment in indicated via MyChart. She voiced understanding of all information and instructions given.   Chart reviewed for nurse visit. Agree with plan of care.   Currie Paris, NP 09/08/2019 5:15 PM

## 2019-09-09 LAB — CERVICOVAGINAL ANCILLARY ONLY
Bacterial Vaginitis (gardnerella): NEGATIVE
Candida Glabrata: NEGATIVE
Candida Vaginitis: NEGATIVE
Chlamydia: NEGATIVE
Comment: NEGATIVE
Comment: NEGATIVE
Comment: NEGATIVE
Comment: NEGATIVE
Comment: NEGATIVE
Comment: NORMAL
Neisseria Gonorrhea: NEGATIVE
Trichomonas: NEGATIVE

## 2019-09-11 ENCOUNTER — Ambulatory Visit: Payer: 59

## 2019-09-16 ENCOUNTER — Other Ambulatory Visit: Payer: Self-pay

## 2019-09-16 ENCOUNTER — Ambulatory Visit (INDEPENDENT_AMBULATORY_CARE_PROVIDER_SITE_OTHER): Payer: 59

## 2019-09-16 DIAGNOSIS — L501 Idiopathic urticaria: Secondary | ICD-10-CM | POA: Diagnosis not present

## 2019-09-16 MED ORDER — EPINEPHRINE 0.3 MG/0.3ML IJ SOAJ: 0.3000 mg | INTRAMUSCULAR | 1 refills | Status: DC | PRN

## 2019-09-16 NOTE — Progress Notes (Signed)
Immunotherapy   Patient Details  Name: Danielle Velez MRN: [id number] Date of Birth: December 20, 1990  09/16/2019  Danielle Velez is restarting Xolair injection for her chronic urticaria. She will receive 300 mg every 4 weeks.  Epi-Pen: Prescription sent. Patient waited 30 minutes post injection in office. She reported no systemic symptoms and no local symptoms noted upon patient leaving office.  Consent signed and patient instructions given.   Dorathy Daft I Abdulrahman Bracey 09/16/2019, 9:22 AM

## 2019-09-17 ENCOUNTER — Telehealth: Payer: Self-pay | Admitting: Allergy & Immunology

## 2019-09-17 NOTE — Telephone Encounter (Signed)
Dr. Gallagher please advise.  

## 2019-09-17 NOTE — Telephone Encounter (Signed)
Patient called and needs to have a letter for her apartment manager because she said that there are bugs in there. She is having problems, she said she just move in there and having problems again like she had before.. and need a list of  Her allergies too.  The name of the place is [employer]. [phone].

## 2019-09-18 NOTE — Telephone Encounter (Signed)
Can you just copy and paste the letter from August 2020 and address is to the new apartment complex?   Thanks, Malachi Bonds, MD [facility]

## 2019-09-18 NOTE — Telephone Encounter (Signed)
Letter copied from August 2020. Call and left message to call clinic.  My chart message sent to pick up letter from clinic.

## 2019-10-14 ENCOUNTER — Ambulatory Visit: Payer: 59

## 2019-10-27 ENCOUNTER — Other Ambulatory Visit: Payer: Self-pay

## 2019-10-27 ENCOUNTER — Ambulatory Visit: Payer: 59

## 2019-10-27 ENCOUNTER — Telehealth: Payer: Self-pay | Admitting: Allergy & Immunology

## 2019-10-27 MED ORDER — ALBUTEROL SULFATE HFA 108 (90 BASE) MCG/ACT IN AERS: INHALATION_SPRAY | RESPIRATORY_TRACT | 1 refills | Status: DC

## 2019-10-27 MED ORDER — BUDESONIDE-FORMOTEROL FUMARATE 160-4.5 MCG/ACT IN AERO: INHALATION_SPRAY | RESPIRATORY_TRACT | 1 refills | Status: DC

## 2019-10-27 NOTE — Telephone Encounter (Signed)
Refills sent in to pharmacy. Unable to reach patient. Left voicemail for patient to call us back and check on how she is doing. She will need a follow up appointment for further refills.

## 2019-10-27 NOTE — Telephone Encounter (Signed)
Patient called and has been having chronic asthma problems with chest hurting and is out of her medications. Patient needs Symbicort and her inhaler refilled. [facility] [address].   Please advise.

## 2019-12-09 ENCOUNTER — Other Ambulatory Visit: Payer: Self-pay | Admitting: Allergy & Immunology

## 2020-02-22 ENCOUNTER — Other Ambulatory Visit: Payer: Self-pay | Admitting: Allergy & Immunology

## 2020-02-23 NOTE — Telephone Encounter (Signed)
Called patient to inform her that she needs to make an appointment and keep it before another refill is sent. Mailbox is full.

## 2020-02-25 ENCOUNTER — Ambulatory Visit (INDEPENDENT_AMBULATORY_CARE_PROVIDER_SITE_OTHER): Payer: 59 | Admitting: Allergy & Immunology

## 2020-02-25 ENCOUNTER — Encounter: Payer: Self-pay | Admitting: Allergy & Immunology

## 2020-02-25 ENCOUNTER — Other Ambulatory Visit: Payer: Self-pay

## 2020-02-25 VITALS — BP 118/76 | HR 58 | Temp 98.7°F | Resp 14 | Ht 60.0 in | Wt 175.8 lb

## 2020-02-25 DIAGNOSIS — L501 Idiopathic urticaria: Secondary | ICD-10-CM | POA: Diagnosis not present

## 2020-02-25 DIAGNOSIS — R091 Pleurisy: Secondary | ICD-10-CM | POA: Diagnosis not present

## 2020-02-25 DIAGNOSIS — J4541 Moderate persistent asthma with (acute) exacerbation: Secondary | ICD-10-CM

## 2020-02-25 DIAGNOSIS — T7800XD Anaphylactic reaction due to unspecified food, subsequent encounter: Secondary | ICD-10-CM

## 2020-02-25 DIAGNOSIS — J3089 Other allergic rhinitis: Secondary | ICD-10-CM

## 2020-02-25 DIAGNOSIS — T7800XA Anaphylactic reaction due to unspecified food, initial encounter: Secondary | ICD-10-CM

## 2020-02-25 DIAGNOSIS — J302 Other seasonal allergic rhinitis: Secondary | ICD-10-CM

## 2020-02-25 MED ORDER — ALBUTEROL SULFATE HFA 108 (90 BASE) MCG/ACT IN AERS: INHALATION_SPRAY | RESPIRATORY_TRACT | 1 refills | Status: DC

## 2020-02-25 MED ORDER — ADVAIR HFA 115-21 MCG/ACT IN AERO: 2.0000 | INHALATION_SPRAY | Freq: Two times a day (BID) | RESPIRATORY_TRACT | 5 refills | Status: DC

## 2020-02-25 MED ORDER — ALBUTEROL SULFATE (2.5 MG/3ML) 0.083% IN NEBU: 2.5000 mg | INHALATION_SOLUTION | Freq: Four times a day (QID) | RESPIRATORY_TRACT | 0 refills | Status: DC | PRN

## 2020-02-25 MED ORDER — EPINEPHRINE 0.3 MG/0.3ML IJ SOAJ: 0.3000 mg | INTRAMUSCULAR | 1 refills | Status: DC | PRN

## 2020-02-25 MED ORDER — BUDESONIDE-FORMOTEROL FUMARATE 160-4.5 MCG/ACT IN AERO: INHALATION_SPRAY | RESPIRATORY_TRACT | 5 refills | Status: DC

## 2020-02-25 MED ORDER — CETIRIZINE HCL 10 MG PO TABS: 10.0000 mg | ORAL_TABLET | Freq: Every day | ORAL | 5 refills | Status: DC

## 2020-02-25 NOTE — Patient Instructions (Addendum)
1. Pleurisy - recurrent - Stable without worsening course.  - Continue with ibuprofen as needed (four tablets every 8 hours is the max dose).  - Labs ordered today (you never got them previously and I want to make sure that we are not dealing with an autoimmune issue).  2. Moderate persistent asthma with current episode of pleurisy - Spirometry looked lackluster, but it did improve with the Xopenex treatment.  - Start the prednisone taper provided today. - We are going to go ahead and get some blood work to see if you would qualify for an injectable medication. - Daily controller medication(s): Symbicort 160/4.5 two puffs twice daily with spacer - Rescue medications: ProAir 4 puffs every 4-6 hours as needed or albuterol nebulizer one vial puffs every 4-6 hours as needed - Asthma control goals:  * Full participation in all desired activities (may need albuterol before activity) * Albuterol use two time or less a week on average (not counting use with activity) * Cough interfering with sleep two time or less a month * Oral steroids no more than once a year * No hospitalizations  3. Allergic rhinitis - Continue with Zyrtec (cetirizine) 10mg  daily.   4. Food allergies (shellfish, wheat, and cow's milk) - EpiPen is up to date.  - Continue to avoid your triggering foods.   5. Return in about 4 months (around 06/27/2020).    Please inform 06/29/2020 of any Emergency Department visits, hospitalizations, or changes in symptoms. Call us before going to the ED for breathing or allergy symptoms since we might be able to fit you in for a sick visit. Feel free to contact us anytime with any questions, problems, or concerns.  It was a pleasure to see you again today!  Websites that have reliable patient information: 1. American Academy of Asthma, Allergy, and Immunology: www.aaaai.org 2. Food Allergy Research and Education (FARE): foodallergy.org 3. Mothers of Asthmatics:  http://www.asthmacommunitynetwork.org 4. American College of Allergy, Asthma, and Immunology: www.acaai.org   COVID-19 Vaccine Information can be found at: [place] For questions related to vaccine distribution or appointments, please email [email or web address] or call [phone].     "Like" [phone] on Facebook and Instagram for our latest updates!     HAPPY FALL!     Make sure you are registered to vote! If you have moved or changed any of your contact information, you will need to get this updated before voting!  In some cases, you MAY be able to register to vote online: [place]

## 2020-02-25 NOTE — Progress Notes (Addendum)
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 Pleurisy - recurrent - Stable without worsening course.  - Continue with ibuprofen as needed (four tablets every 8 hours is the max dose).  - Labs ordered today (you never got them previously and I want to make sure that we are not dealing with an autoimmune issue).  2. Moderate persistent asthma with current episode of pleurisy - Spirometry looked lackluster, but it did improve with the Xopenex treatment.  - Start the prednisone taper provided today. - We are going to go ahead and get some blood work to see if you would qualify for an injectable medication. - Daily controller medication(s): Symbicort 160/4.5 two puffs twice daily with spacer - Rescue medications: ProAir 4 puffs every 4-6 hours as needed or albuterol nebulizer one vial puffs every 4-6 hours as needed - Asthma control goals:  * Full participation in all desired activities (may need albuterol before activity) * Albuterol use two time or less a week on average (not counting use with activity) * Cough interfering with sleep two time or less a month * Oral steroids no more than once a year * No hospitalizations  3. Allergic rhinitis - Continue with Zyrtec (cetirizine) 10mg  daily.   4. Food allergies (shellfish, wheat, and cow's milk) - EpiPen is up to date.  - Continue to avoid your triggering foods.   5. Return in about 4 months (around 06/27/2020).    Subjective:   Justene Jensen is a 29 y.o. female presenting today for follow up of  Chief Complaint    Patient presents with  . Asthma    Been having issues with breathing, needs refills, says shes been getting sick arount this time. Has had to use rescue inhaler very oftern the past two weeks    Hendricks Milo has a history of the following: Patient Active Problem List   Diagnosis Date Noted  . 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 Vaginal discharge 07/30/2018  . Upper respiratory tract infection 06/05/2018  . Seasonal and perennial allergic rhinitis 06/05/2018  . History of herpes labialis 01/22/2018  . Folliculitis 01/22/2018  . Chest pain on breathing 11/11/2017  . Possible pregnancy 11/11/2017  . Adverse food reaction 11/11/2017  . Recurrent pleurisy 07/19/2016  . Moderate persistent asthma 03/22/2015  . Food allergy 03/22/2015  . Moderate persistent asthma with acute exacerbation 03/09/2015  . Allergic rhinitis 03/09/2015    History obtained from: chart review and patient.  Matisyn is a 29 y.o. female presenting for a follow up visit.  She was last seen in January 2021.  At that time, she was doing fairly well but she was experiencing a pleurisy flare.  Chest x-ray had already been done and was normal.  She has not given her ANA and inflammatory markers.  She did want to restart Xolair for control of her hives and her asthma.  We continued her on cetirizine 10 mg daily.  We also recommended continued avoidance of shellfish, wheat, and cows milk.  Since last visit, she has received 2 Xolair injections and then stopped following up.  She presents today for asthma flare.  Asthma/Respiratory Symptom History: She is no longer on the Symbicort.  She ran out of it 2 weeks ago, which is another reason she is here today.  She did very well when she was on the Symbicort and even after stopping the Xolair.  She has not been needing her rescue inhaler.  She has not needed prednisone since we have given it to her last.  She does endorse some chest pain, which is  characteristic of her pleurisy.  Allergic Rhinitis Symptom History: She remains on the cetirizine 10 mg daily.  She is not using a nasal spray much at all.  She has not needed antibiotics since last time we saw her.  Food Allergy Symptom History: She continues to avoid shellfish, wheat, and cows milk.  She does need a new EpiPen.   She is not vaccinated against COVID-19.  She does not really want to go into why she is not planning to get it.  She is also pursuing a degree in [employer] with a completion date in 2023.   She continues to work at the same place.  She is doing Sports coach.  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 e to go back to.     Review of Systems  Constitutional: Negative.  Negative for chills, fever, malaise/fatigue and weight loss.  HENT: Negative for congestion, ear discharge, ear pain and sinus pain.   Eyes: Negative for pain, discharge and redness.  Respiratory: Positive for cough and shortness of breath. Negative for sputum production and wheezing.   Cardiovascular: Negative.  Negative for chest pain and palpitations.  Gastrointestinal: Negative for abdominal pain, constipation, diarrhea, heartburn, nausea and vomiting.  Skin: Negative.  Negative for itching and rash.  Neurological: Negative for dizziness and headaches.  Endo/Heme/Allergies: Positive for environmental allergies. Does not bruise/bleed easily.       Objective:   Blood pressure 118/76, pulse (!) 58, temperature 98.7 F (37.1 C), resp. rate 14, height 5' (1.524 m), weight 175 lb 12.8 oz (79.7 kg), SpO2 98 %. Body mass index is 34.33 kg/m.   Physical Exam:  Physical Exam Constitutional:      Appearance: She is well-developed.  HENT:     Head: Normocephalic and atraumatic.     Right Ear: Tympanic membrane, ear canal and external ear normal.      Left Ear: Tympanic membrane, ear canal and external ear normal.     Nose: No nasal deformity, septal deviation, mucosal edema or rhinorrhea.     Right Sinus: No maxillary sinus tenderness or frontal sinus tenderness.     Left Sinus: No maxillary sinus tenderness or frontal sinus tenderness.     Mouth/Throat:     Mouth: Mucous membranes are not pale and not dry.     Pharynx: Uvula midline.  Eyes:     General: Allergic shiner present.        Right eye: No discharge.        Left eye: No discharge.     Conjunctiva/sclera: Conjunctivae normal.     Right eye: Right conjunctiva is not injected. No chemosis.    Left eye: Left conjunctiva is not injected. No chemosis.    Pupils: Pupils are equal, round, and reactive to light.  Cardiovascular:     Rate and Rhythm: Normal rate and regular rhythm.     Heart sounds: Normal heart sounds.  Pulmonary:     Effort: Pulmonary effort is normal. No tachypnea, accessory muscle usage or respiratory distress.     Breath sounds: Normal breath sounds. No wheezing, rhonchi or rales.     Comments: Decreased air movement at the bases. Chest:     Chest wall: No tenderness.  Lymphadenopathy:     Cervical: No cervical adenopathy.  Skin:    Coloration: Skin is not pale.     Findings: No abrasion, erythema, petechiae or rash. Rash is not papular, urticarial or vesicular.     Comments: No eczematous or urticarial lesions noted.  Neurological:     Mental Status: She is alert.  Psychiatric:        Behavior: Behavior is cooperative.      Diagnostic studies: spiro performed and labs collected  Spirometry: results normal (FEV1: 1.65/68%, FVC: 2139/86%, FEV1/FVC: 69%).    Spirometry consistent with normal pattern. Xopenex four puffs via MDI treatment given in clinic with significant improvement in FEV1 and FVC per ATS criteria.  Allergy Studies: none       Malachi Bonds, MD  [facility]

## 2020-02-26 LAB — C-REACTIVE PROTEIN: CRP: 2 mg/L (ref 0–10)

## 2020-02-26 LAB — SEDIMENTATION RATE: Sed Rate: 17 mm/hr (ref 0–32)

## 2020-02-27 LAB — ANTINUCLEAR ANTIBODIES, IFA: ANA Titer 1: NEGATIVE

## 2020-03-21 ENCOUNTER — Telehealth: Payer: Self-pay

## 2020-03-21 NOTE — Telephone Encounter (Signed)
Patient called stating she was tested for COVID testing yesterday at [facility] and was positive. Patient is complaining of chest discomfort, sinus problem, chills loss of appetite. Is taking Mucinex, albuterol inhaler and currently finishing a course of prednisone which she only has 2 left. Wants to know what other suggestions Dr. Dellis Anes may have regarding her positive results.

## 2020-03-22 ENCOUNTER — Other Ambulatory Visit: Payer: Self-pay

## 2020-03-22 MED ORDER — PREDNISONE 10 MG PO TABS: ORAL_TABLET | ORAL | 0 refills | Status: DC

## 2020-03-22 NOTE — Telephone Encounter (Signed)
I would make sure that she has the number for the infusion center. Below are additional recommendations. I would also get a pulse ox.   Recommendations for at home COVID-19 symptoms management:  1. Please continue isolation at home. 2. Call [phone] to see whether you might be eligible for therapeutic antibody infusions (leave your name and they will call you back).  3. If have acute worsening of symptoms please go to ER/urgent care for further evaluation. 4. Check pulse oximetry and if below 90-92% please go to ER. 5. The following supplements MAY help:  ? Vitamin C 500mg  twice a day and Quercetin 250-500 mg twice a day ? Vitamin D3 2000 - 4000 u/day ? B Complex vitamins ? Zinc 75-100 mg/day ? Melatonin 6-10 mg at night (the optimal dose is unknown) ? Aspirin 81mg /day (if no history of bleeding issues)  8-10, MD [facility]

## 2020-03-22 NOTE — Telephone Encounter (Signed)
Called patient and informed her of the note provided by Dr. Dellis Anes. Patient verbally said she understood and asked if Dr. Dellis Anes could send in prednisone. I asked Dr. Dellis Anes and he verbally stated that she could get an equivalent of a today pack. Medication will be sent in and patient notified.

## 2020-03-24 ENCOUNTER — Other Ambulatory Visit: Payer: Self-pay

## 2020-03-24 ENCOUNTER — Emergency Department (HOSPITAL_COMMUNITY)
Admission: EM | Admit: 2020-03-24 | Discharge: 2020-03-24 | Disposition: A | Discharge status: Home / Self Care | Payer: 59 | Attending: Emergency Medicine | Admitting: Emergency Medicine

## 2020-03-24 ENCOUNTER — Encounter (HOSPITAL_COMMUNITY): Payer: Self-pay | Admitting: Emergency Medicine

## 2020-03-24 ENCOUNTER — Emergency Department (HOSPITAL_COMMUNITY): Payer: 59

## 2020-03-24 DIAGNOSIS — J4541 Moderate persistent asthma with (acute) exacerbation: Secondary | ICD-10-CM | POA: Insufficient documentation

## 2020-03-24 DIAGNOSIS — U071 COVID-19: Secondary | ICD-10-CM | POA: Diagnosis not present

## 2020-03-24 DIAGNOSIS — Z7951 Long term (current) use of inhaled steroids: Secondary | ICD-10-CM | POA: Insufficient documentation

## 2020-03-24 DIAGNOSIS — R079 Chest pain, unspecified: Secondary | ICD-10-CM | POA: Diagnosis present

## 2020-03-24 LAB — CBC
HCT: 40.2 % (ref 36.0–46.0)
Hemoglobin: 13.3 g/dL (ref 12.0–15.0)
MCH: 27.4 pg (ref 26.0–34.0)
MCHC: 33.1 g/dL (ref 30.0–36.0)
MCV: 82.9 fL (ref 80.0–100.0)
Platelets: 272 10*3/uL (ref 150–400)
RBC: 4.85 MIL/uL (ref 3.87–5.11)
RDW: 13.1 % (ref 11.5–15.5)
WBC: 6.9 10*3/uL (ref 4.0–10.5)
nRBC: 0 % (ref 0.0–0.2)

## 2020-03-24 LAB — BASIC METABOLIC PANEL
Anion gap: 9 (ref 5–15)
BUN: 10 mg/dL (ref 6–20)
CO2: 24 mmol/L (ref 22–32)
Calcium: 8.9 mg/dL (ref 8.9–10.3)
Chloride: 107 mmol/L (ref 98–111)
Creatinine, Ser: 0.84 mg/dL (ref 0.44–1.00)
GFR, Estimated: >60 mL/min (ref >60)
Glucose, Bld: 94 mg/dL (ref 70–99)
Potassium: 3.8 mmol/L (ref 3.5–5.1)
Sodium: 140 mmol/L (ref 135–145)

## 2020-03-24 LAB — I-STAT BETA HCG BLOOD, ED (MC, WL, AP ONLY): I-stat hCG, quantitative: <5 m[IU]/mL (ref <5)

## 2020-03-24 LAB — TROPONIN I (HIGH SENSITIVITY): Troponin I (High Sensitivity): <2 ng/L (ref <18)

## 2020-03-24 MED ORDER — ALBUTEROL SULFATE HFA 108 (90 BASE) MCG/ACT IN AERS: 2.0000 | INHALATION_SPRAY | RESPIRATORY_TRACT | Status: DC | PRN

## 2020-03-24 NOTE — ED Triage Notes (Signed)
Pt reports tested Covid+ on 11/6 or 7th. [facility]. Pt c/o generalized chest pains when she wakes up in the mornings and go throughout the day.

## 2020-03-24 NOTE — ED Provider Notes (Signed)
[facility]-EMERGENCY DEPT Provider Note   CSN: [id number] Arrival date & time: 03/24/20  1257     History Chief Complaint  Patient presents with  . Covid Positive  . Chest Pain    Danielle Velez is a 29 y.o. female.  The history is provided by the patient and medical records. No language interpreter was used.  Chest Pain    29 year old female significant history of asthma, recently test positive for COVID-19 presenting complaining of Covid symptoms.  Patient report she has had symptoms of Covid including chills, body aches, loss of taste and smell, productive cough, pain in her chest, shortness of breath, wheezing, diarrhea ongoing for the past 8 days.  She mention she has been tested positive for COVID-19 at 2 separate facility.  She was also seen and evaluated by her pulmonologist recently who has prescribed prednisone that she received yesterday.  She is using her inhaler 3-4 times a day.  She is here requesting help with chest discomfort and body aches and overall not feeling well.  She was not vaccinated for COVID-19.    Past Medical History:  Diagnosis Date  . Angio-edema   . Asthma   . Recurrent upper respiratory infection (URI)   . Urticaria     Patient Active Problem List   Diagnosis Date Noted  . 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 Vaginal discharge 07/30/2018  . Upper respiratory tract infection 06/05/2018  . Seasonal and perennial allergic rhinitis 06/05/2018  . History of herpes labialis 01/22/2018  . Folliculitis 01/22/2018  . Chest pain on breathing 11/11/2017  . Possible pregnancy 11/11/2017  . Adverse food reaction 11/11/2017  . Recurrent pleurisy 07/19/2016  . Moderate persistent asthma 03/22/2015  . Food allergy 03/22/2015  . Moderate persistent asthma with acute exacerbation 03/09/2015  . Allergic rhinitis 03/09/2015    Past Surgical History:  Procedure Laterality Date  . CESAREAN SECTION    . CESAREAN  SECTION  09/16/2010     OB History   No obstetric history on file.     Family History  Problem Relation Age of Onset  . Asthma Maternal Grandmother   . Allergic rhinitis Neg Hx   . Angioedema Neg Hx   . Atopy Neg Hx   . Eczema Neg Hx   . Immunodeficiency Neg Hx   . Urticaria Neg Hx     Social History   Tobacco Use  . Smoking status: Never Smoker  . Smokeless tobacco: Never Used  Vaping Use  . Vaping Use: Never used  Substance Use Topics  . Alcohol use: No    Alcohol/week: 0.0 standard drinks  . Drug use: No    Home Medications Prior to Admission medications   Medication Sig Start Date End Date Taking? Authorizing Provider  albuterol (PROVENTIL) (2.5 MG/3ML) 0.083% nebulizer solution Take 3 mLs (2.5 mg total) by nebulization every 6 (six) hours as needed for wheezing or shortness of breath. 02/25/20   Alfonse Spruce, MD  albuterol (VENTOLIN HFA) 108 (90 Base) MCG/ACT inhaler INHALE 4 PUFFS INTO THE LUNGS EVERY 4 HOURS AS NEEDED FOR WHEEZING OR SHORTNESS OF BREATH (COUGH). 02/25/20   Alfonse Spruce, MD  budesonide-formoterol [facility]) 160-4.5 MCG/ACT inhaler TAKE 2 PUFFS BY MOUTH TWICE A DAY 02/25/20   Alfonse Spruce, MD  cetirizine (ZYRTEC) 10 MG tablet Take 1 tablet (10 mg total) by mouth daily. 02/25/20   Alfonse Spruce, MD  EPINEPHrine 0.3 mg/0.3 mL IJ SOAJ injection Inject 0.3  mg into the muscle as needed for anaphylaxis. 02/25/20   Alfonse Spruce, MD  fluticasone-salmeterol (ADVAIR [facility]) [phone] MCG/ACT inhaler Inhale 2 puffs into the lungs 2 (two) times daily. 02/25/20   Alfonse Spruce, MD  predniSONE (DELTASONE) 10 MG tablet Take 2 at breakfast on day one Day 2 and 3 take 2 pills at breakfast and 2 at lunch Day 4 take 2 at breakfast Day 5 take 1 pill at breakfast 03/22/20   Alfonse Spruce, MD  valACYclovir (VALTREX) 1000 MG tablet Take 1 tablet (1,000 mg total) by mouth 2 (two) times daily. 12/10/18   Marylene Land, CNM    Allergies    Effexor [venlafaxine], Other, and Shellfish allergy  Review of Systems   Review of Systems  Cardiovascular: Positive for chest pain.  All other systems reviewed and are negative.   Physical Exam Updated Vital Signs BP 134/81 (BP Location: Left Arm)   Pulse 64   Temp 98.3 F (36.8 C) (Oral)   Resp 18   LMP 03/21/2020   SpO2 99%   Physical Exam Vitals and nursing note reviewed.  Constitutional:      General: She is not in acute distress.    Appearance: She is well-developed.     Comments: Patient is well-appearing, sitting comfortably in the chair appears to be in no acute discomfort.  HENT:     Head: Atraumatic.  Eyes:     Conjunctiva/sclera: Conjunctivae normal.  Cardiovascular:     Rate and Rhythm: Normal rate and regular rhythm.     Pulses: Normal pulses.     Heart sounds: Normal heart sounds.  Pulmonary:     Effort: Pulmonary effort is normal.     Breath sounds: Normal breath sounds. No wheezing, rhonchi or rales.  Abdominal:     Palpations: Abdomen is soft.     Tenderness: There is no abdominal tenderness.  Musculoskeletal:        General: Normal range of motion.     Cervical back: Neck supple.  Skin:    Findings: No rash.  Neurological:     Mental Status: She is alert and oriented to person, place, and time.  Psychiatric:        Mood and Affect: Mood normal.     ED Results / Procedures / Treatments   Labs (all labs ordered are listed, but only abnormal results are displayed) Labs Reviewed  BASIC METABOLIC PANEL  CBC  I-STAT BETA HCG BLOOD, ED ([facility], [facility], [facility] ONLY)  TROPONIN I (HIGH SENSITIVITY)    EKG None   Date: 03/24/2020  Rate: 57  Rhythm: sinus bradycardia  QRS Axis: normal  Intervals: normal  ST/T Wave abnormalities: normal  Conduction Disutrbances: none  Narrative Interpretation:   Old EKG Reviewed: No significant changes noted     Radiology DG Chest 2 View  Result Date: 03/24/2020 CLINICAL DATA:   Chest pain, COVID-19 positive EXAM: CHEST - 2 VIEW COMPARISON:  05/17/2019 FINDINGS: The heart size and mediastinal contours are within normal limits. No focal airspace consolidation, pleural effusion, or pneumothorax. The visualized skeletal structures are unremarkable. IMPRESSION: No active cardiopulmonary disease. Electronically Signed   By: Duanne Guess D.O.   On: 03/24/2020 14:27    Procedures Procedures (including critical care time)  Medications Ordered in ED Medications  albuterol (VENTOLIN HFA) 108 (90 Base) MCG/ACT inhaler 2 puff (has no administration in time range)    ED Course  I have reviewed the triage vital signs and the nursing  notes.  Pertinent labs & imaging results that were available during my care of the patient were reviewed by me and considered in my medical decision making (see chart for details).    MDM Rules/Calculators/A&P                          BP 134/81 (BP Location: Left Arm)   Pulse 64   Temp 98.3 F (36.8 C) (Oral)   Resp 18   LMP 03/21/2020   SpO2 99%   Final Clinical Impression(s) / ED Diagnoses Final diagnoses:  COVID-19 virus infection    Rx / DC Orders ED Discharge Orders    None     3:56 PM\ Patient recently test positive for Covid and has had symptoms ongoing for the past 8 days is here with worsening aches and pains and shortness of breath.  Patient however is well-appearing, no hypoxia, resting comfortably, with stable normal vital sign.  Chest x-ray did not show any signs of pneumonia, EKG troponin and labs are reassuring.  She does meet criteria for infusion clinic which is strong encourage patient to call and follow-up.  I will also provide note the rescue inhaler for her to have but otherwise she is stable for discharge.  Strict return precaution discussed.  Martrice Apt was evaluated in Emergency Department on 03/24/2020 for the symptoms described in the history of present illness. She was evaluated in the context of the  global COVID-19 pandemic, which necessitated consideration that the patient might be at risk for infection with the SARS-CoV-2 virus that causes COVID-19. Institutional protocols and algorithms that pertain to the evaluation of patients at risk for COVID-19 are in a state of rapid change based on information released by regulatory bodies including the CDC and federal and state organizations. These policies and algorithms were followed during the patient's care in the ED.    Fayrene Helper, PA-C 03/24/20 1633    Gwyneth Sprout, MD 03/24/20 [phone]

## 2020-03-24 NOTE — Discharge Instructions (Signed)
Recommendations for at home COVID-19 symptoms management:  Please continue isolation at home. Call [phone] to see whether you might be eligible for therapeutic antibody infusions (leave your name and they will call you back).  If have acute worsening of symptoms please go to ER/urgent care for further evaluation. Check pulse oximetry and if below 90-92% please go to ER. The following supplements MAY help:  Vitamin C 500mg twice a day and Quercetin 250-500 mg twice a day Vitamin D3 2000 - 4000 u/day B Complex vitamins Zinc 75-100 mg/day Melatonin 6-10 mg at night (the optimal dose is unknown) Aspirin 81mg/day (if no history of bleeding issues)  

## 2020-04-14 ENCOUNTER — Ambulatory Visit: Payer: 59 | Admitting: Obstetrics and Gynecology

## 2020-04-14 NOTE — Patient Instructions (Addendum)
Moderate persistent asthma Stop Symbicort due to cost Start AirDuo 232/14- one puff twice a day to help prevent cough and wheeze. Coupon card given. Call our office if this medication is too expensive May use albuterol 2 puffs every 4 hours as needed for cough, wheeze, tightness in chest, or shortness of breath. Asthma control goals:   Full participation in all desired activities (may need albuterol before activity)  Albuterol use two time or less a week on average (not counting use with activity)  Cough interfering with sleep two time or less a month  Oral steroids no more than once a year  No hospitalizations  Pleurisy-recurrent Not able to take ibuprofen due to possible ulcers. Having endoscopy next month Find a primary care physician and schedule an appointment to discuss  Allergic rhinitis Continue Zyrtec 10 mg once a day as needed for runny nose  Food allergy Avoid shellfish, wheat, and cows milk. In case of an allergic reaction, give Benadryl 4 teaspoonfuls every 4 hours, and if life-threatening symptoms occur, inject with EpiPen 0.3 mg.  Chronic idiopathic urticaria Stable on Zyrtec 10 mg once a day and dietary avoidance   Please let us know if this treatment plan is not working well for you. Schedule a follow-up appointment in 3 months

## 2020-04-15 ENCOUNTER — Other Ambulatory Visit: Payer: Self-pay

## 2020-04-15 ENCOUNTER — Ambulatory Visit (INDEPENDENT_AMBULATORY_CARE_PROVIDER_SITE_OTHER): Payer: 59 | Admitting: Family

## 2020-04-15 ENCOUNTER — Encounter: Payer: Self-pay | Admitting: Family

## 2020-04-15 VITALS — BP 122/78 | HR 68 | Temp 98.6°F | Resp 14 | Ht 60.0 in | Wt 178.8 lb

## 2020-04-15 DIAGNOSIS — J3089 Other allergic rhinitis: Secondary | ICD-10-CM

## 2020-04-15 DIAGNOSIS — J302 Other seasonal allergic rhinitis: Secondary | ICD-10-CM

## 2020-04-15 DIAGNOSIS — J4540 Moderate persistent asthma, uncomplicated: Secondary | ICD-10-CM | POA: Diagnosis not present

## 2020-04-15 DIAGNOSIS — R091 Pleurisy: Secondary | ICD-10-CM

## 2020-04-15 DIAGNOSIS — T7800XD Anaphylactic reaction due to unspecified food, subsequent encounter: Secondary | ICD-10-CM | POA: Diagnosis not present

## 2020-04-15 DIAGNOSIS — T7800XA Anaphylactic reaction due to unspecified food, initial encounter: Secondary | ICD-10-CM

## 2020-04-15 MED ORDER — EPINEPHRINE 0.3 MG/0.3ML IJ SOAJ: 0.3000 mg | INTRAMUSCULAR | 1 refills | Status: DC | PRN

## 2020-04-15 MED ORDER — AIRDUO DIGIHALER 232-14 MCG/ACT IN AEPB: INHALATION_SPRAY | RESPIRATORY_TRACT | 5 refills | Status: DC

## 2020-04-15 NOTE — Progress Notes (Signed)
[address] [place] [address] Dept: [phone]  FOLLOW UP NOTE  Patient ID: Danielle Velez, female    DOB: 07-24-1990  Age: 29 y.o. MRN: [id number] Date of Office Visit: 04/15/2020  Assessment  Chief Complaint: Asthma (Chest pains. Follow up from Covid)  HPI Danielle Velez is a 29 year old female who presents today for follow-up of pleurisy, moderate persistent asthma, allergic rhinitis, and food allergies.  She was last seen on February 25, 2020 by Dr. Dellis Anes.  Moderate persistent asthma is reported as not well controlled with albuterol as needed.  She reports that she has been out of Symbicort 160/4.5 for about 5 months now.  She is using albuterol 3 puffs 2 times a day.  She reports wheezing, tightness in her chest, and shortness of breath that is worse in the morning and denies coughing and nocturnal awakenings.  Since her last office visit she has not required any trips to the emergency room or urgent care due to breathing problems.  She has had 1 round of steroids on March 22, 2020.  She recently went to the emergency room on March 24, 2020 for COVID-19 symptoms and had a chest x-ray that showed no active cardiopulmonary disease.  Pleurisy is reported as not well controlled.  She continues have off-and-on pleurisy.  She reports that this is been going on since middle school.  She is no longer allowed to take ibuprofen due to having an endoscopy next month for possible stomach ulcers.  She is currently on medication for acid reflux.  She is not established with a primary care physician.  Instructed patient's on the importance of finding a primary care physician to discuss altnernatives.  Allergic rhinitis is reported as moderately controlled with Zyrtec 10 mg once a day as needed.  She denies rhinorrhea, nasal congestion, and postnasal drip.  She does feel in her throat at times.  She continues to avoid shellfish, wheat, and cows milk without any accidental ingestion or  use of her EpiPen device.  She is requesting a refill on her EpiPen.  Yesterday she did peel shrimp for her son, but did not eat any.  She washed her hands after, but then scratched her eye. This morning she noted crusting.  Instructed patient if symptoms worsen or persist to go to urgent care.     Drug Allergies:  Allergies  Allergen Reactions  . Effexor [Venlafaxine] Other (See Comments)    Shaky   . Other Hives    DAIRY and WHEAT.  REACTION HIVES AND ITCHING.  Marland Kitchen Shellfish Allergy Itching and Swelling    Review of Systems: Review of Systems  Constitutional: Negative for chills and fever.  HENT:       Denies rhinorrhea, nasal congestion, post nasal drip. Reports feeling of mucous in throat at times  Eyes:       Denies itchy watery eyes  Respiratory: Positive for shortness of breath and wheezing. Negative for cough.   Cardiovascular: Negative for palpitations.       Reports continued pleurisy pain on left side of chest. Reports been off/ongoing since middle school  Gastrointestinal: Negative for abdominal pain and heartburn.  Genitourinary: Negative for dysuria.  Skin: Negative for itching and rash.  Neurological: Positive for headaches.       Reports headaches like migraines since having COVID-19. Instructed her on the importance of establishing a primary care physician  Endo/Heme/Allergies: Positive for environmental allergies.     Physical Exam: LMP 03/21/2020    Physical Exam  Constitutional:      Appearance: Normal appearance.  HENT:     Head: Normocephalic and atraumatic.     Comments: Pharynx normal. Eyes normal. Ears normal. Nose normal    Right Ear: Tympanic membrane, ear canal and external ear normal.     Left Ear: Tympanic membrane, ear canal and external ear normal.     Nose: Nose normal.     Mouth/Throat:     Mouth: Mucous membranes are moist.     Pharynx: Oropharynx is clear.  Eyes:     Conjunctiva/sclera: Conjunctivae normal.  Cardiovascular:     Rate  and Rhythm: Regular rhythm.     Heart sounds: Normal heart sounds.  Pulmonary:     Effort: Pulmonary effort is normal.     Breath sounds: Normal breath sounds.     Comments: Lungs clear to auscultation Musculoskeletal:     Cervical back: Neck supple.  Skin:    General: Skin is warm.     Comments: No urticarial lesions noted  Neurological:     Mental Status: She is alert and oriented to person, place, and time.  Psychiatric:        Mood and Affect: Mood normal.        Behavior: Behavior normal.        Thought Content: Thought content normal.        Judgment: Judgment normal.     Diagnostics: FVC 2.56 L, FEV1 1.97 L.  Predicted FVC 2.78 L, FEV1 2.41 L.  Spirometry indicates normal ventilatory function.  Assessment and Plan: 1. Not well controlled moderate persistent asthma   2. Seasonal and perennial allergic rhinitis   3. Anaphylactic shock due to food, initial encounter   4. Pleurisy     Meds ordered this encounter  Medications  . EPINEPHrine 0.3 mg/0.3 mL IJ SOAJ injection    Sig: Inject 0.3 mg into the muscle as needed for anaphylaxis.    Dispense:  2 each    Refill:  1    Please dispense Mylan or Teva brand generic only. Thank you.  Marland Kitchen Fluticasone-Salmeterol,sensor, (AIRDUO DIGIHALER) 232-14 MCG/ACT AEPB    Sig: Inhale 1 puff twice a day into the lungs    Dispense:  1 each    Refill:  5    Patient has coupon card    Patient Instructions  Moderate persistent asthma Stop Symbicort due to cost Start AirDuo 232/14- one puff twice a day to help prevent cough and wheeze. Coupon card given. Call our office if this medication is too expensive May use albuterol 2 puffs every 4 hours as needed for cough, wheeze, tightness in chest, or shortness of breath. Asthma control goals:   Full participation in all desired activities (may need albuterol before activity)  Albuterol use two time or less a week on average (not counting use with activity)  Cough interfering with  sleep two time or less a month  Oral steroids no more than once a year  No hospitalizations  Pleurisy-recurrent Not able to take ibuprofen due to possible ulcers. Having endoscopy next month Find a primary care physician and schedule an appointment to discuss  Allergic rhinitis Continue Zyrtec 10 mg once a day as needed for runny nose  Food allergy Avoid shellfish, wheat, and cows milk. In case of an allergic reaction, give Benadryl 4 teaspoonfuls every 4 hours, and if life-threatening symptoms occur, inject with EpiPen 0.3 mg.  Chronic idiopathic urticaria Stable on Zyrtec 10 mg once a day and dietary avoidance  Please let us know if this treatment plan is not working well for you. Schedule a follow-up appointment in 3 months   Return in about 3 months (around 07/14/2020), or if symptoms worsen or fail to improve.    Thank you for the opportunity to care for this patient.  Please do not hesitate to contact me with questions.  Nehemiah Settle, FNP [facility]

## 2020-04-20 ENCOUNTER — Ambulatory Visit: Payer: 59 | Admitting: Obstetrics and Gynecology

## 2020-04-22 ENCOUNTER — Ambulatory Visit: Payer: 59 | Admitting: Advanced Practice Midwife

## 2020-04-28 ENCOUNTER — Emergency Department (HOSPITAL_COMMUNITY)
Admission: EM | Admit: 2020-04-28 | Discharge: 2020-04-28 | Disposition: A | Discharge status: Home / Self Care | Payer: 59 | Attending: Emergency Medicine | Admitting: Emergency Medicine

## 2020-04-28 ENCOUNTER — Encounter (HOSPITAL_COMMUNITY): Payer: Self-pay | Admitting: Obstetrics and Gynecology

## 2020-04-28 ENCOUNTER — Other Ambulatory Visit: Payer: Self-pay

## 2020-04-28 DIAGNOSIS — B9689 Other specified bacterial agents as the cause of diseases classified elsewhere: Secondary | ICD-10-CM | POA: Diagnosis not present

## 2020-04-28 DIAGNOSIS — Z7951 Long term (current) use of inhaled steroids: Secondary | ICD-10-CM | POA: Insufficient documentation

## 2020-04-28 DIAGNOSIS — J45909 Unspecified asthma, uncomplicated: Secondary | ICD-10-CM | POA: Diagnosis not present

## 2020-04-28 DIAGNOSIS — N760 Acute vaginitis: Secondary | ICD-10-CM | POA: Diagnosis not present

## 2020-04-28 DIAGNOSIS — R102 Pelvic and perineal pain: Secondary | ICD-10-CM | POA: Diagnosis present

## 2020-04-28 LAB — URINALYSIS, ROUTINE W REFLEX MICROSCOPIC
Bilirubin Urine: NEGATIVE
Glucose, UA: NEGATIVE mg/dL
Hgb urine dipstick: NEGATIVE
Ketones, ur: NEGATIVE mg/dL
Leukocytes,Ua: NEGATIVE
Nitrite: NEGATIVE
Protein, ur: NEGATIVE mg/dL
Specific Gravity, Urine: 1.012 (ref 1.005–1.030)
pH: 7 (ref 5.0–8.0)

## 2020-04-28 LAB — WET PREP, GENITAL
Sperm: NONE SEEN
Trich, Wet Prep: NONE SEEN
Yeast Wet Prep HPF POC: NONE SEEN

## 2020-04-28 MED ORDER — METRONIDAZOLE 500 MG PO TABS: 500.0000 mg | ORAL_TABLET | Freq: Two times a day (BID) | ORAL | 0 refills | Status: AC

## 2020-04-28 NOTE — ED Notes (Signed)
Assisted provider with pelvic exam. Specimens sent. Tolerate well.

## 2020-04-28 NOTE — ED Triage Notes (Signed)
Patient reports vaginal pain, cramping, and discomfort. Patient reports she has had multiple cases of BV following the birth of her son and has been having pain with urination as well.

## 2020-04-28 NOTE — ED Provider Notes (Signed)
[facility]-EMERGENCY DEPT Provider Note   CSN: [id number] Arrival date & time: 04/28/20  1222     History Chief Complaint  Patient presents with  . Vaginal Pain    Danielle Velez is a 29 y.o. female.  HPI Patient is a 29 year old female with history of genital and anal herpes who presents to the emergency department due to pelvic pain.  Her symptoms started about 3 to 4 days ago.  She states her pain is in the left lower pelvic region.  Reports associated vaginal irritation, increase in foul odor in the region, as well as dysuria.  Denies hematuria or vaginal discharge.  Reports similar symptoms in the past and states she has been diagnosed with BV as well as yeast infections multiple times in the past.  She has not been sexually active for the last 3 months.  She reports 2 female sexual partners in the past 6 months.  One of her sexual partners she was not regularly using condoms with.  LMP was on 12/8 and was normal. No fevers, chills, chest pain or shortness of breath, nausea, vomiting, other regions of abdominal pain.  Patient reports using a new soap.    Past Medical History:  Diagnosis Date  . Angio-edema   . Asthma   . Recurrent upper respiratory infection (URI)   . Urticaria     Patient Active Problem List   Diagnosis Date Noted  . 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 Vaginal discharge 07/30/2018  . Upper respiratory tract infection 06/05/2018  . Seasonal and perennial allergic rhinitis 06/05/2018  . History of herpes labialis 01/22/2018  . Folliculitis 01/22/2018  . Chest pain on breathing 11/11/2017  . Possible pregnancy 11/11/2017  . Adverse food reaction 11/11/2017  . Recurrent pleurisy 07/19/2016  . Moderate persistent asthma 03/22/2015  . Food allergy 03/22/2015  . Moderate persistent asthma with acute exacerbation 03/09/2015  . Allergic rhinitis 03/09/2015    Past Surgical History:  Procedure Laterality Date  .  CESAREAN SECTION    . CESAREAN SECTION  09/16/2010     OB History   No obstetric history on file.     Family History  Problem Relation Age of Onset  . Asthma Maternal Grandmother   . Allergic rhinitis Neg Hx   . Angioedema Neg Hx   . Atopy Neg Hx   . Eczema Neg Hx   . Immunodeficiency Neg Hx   . Urticaria Neg Hx     Social History   Tobacco Use  . Smoking status: Never Smoker  . Smokeless tobacco: Never Used  Vaping Use  . Vaping Use: Never used  Substance Use Topics  . Alcohol use: No    Alcohol/week: 0.0 standard drinks  . Drug use: No    Home Medications Prior to Admission medications   Medication Sig Start Date End Date Taking? Authorizing Provider  albuterol (PROVENTIL) (2.5 MG/3ML) 0.083% nebulizer solution Take 3 mLs (2.5 mg total) by nebulization every 6 (six) hours as needed for wheezing or shortness of breath. 02/25/20   Alfonse Spruce, MD  albuterol (VENTOLIN HFA) 108 (90 Base) MCG/ACT inhaler INHALE 4 PUFFS INTO THE LUNGS EVERY 4 HOURS AS NEEDED FOR WHEEZING OR SHORTNESS OF BREATH (COUGH). 02/25/20   Alfonse Spruce, MD  budesonide-formoterol [facility]) 160-4.5 MCG/ACT inhaler TAKE 2 PUFFS BY MOUTH TWICE A DAY 02/25/20   Alfonse Spruce, MD  cetirizine (ZYRTEC) 10 MG tablet Take 1 tablet (10 mg total) by mouth daily.  02/25/20   Alfonse Spruce, MD  EPINEPHrine 0.3 mg/0.3 mL IJ SOAJ injection Inject 0.3 mg into the muscle as needed for anaphylaxis. 04/15/20   Nehemiah Settle, FNP  fluticasone-salmeterol (ADVAIR HFA) [phone] MCG/ACT inhaler Inhale 2 puffs into the lungs 2 (two) times daily. Patient not taking: Reported on 04/15/2020 02/25/20   Alfonse Spruce, MD  Fluticasone-Salmeterol,sensor, [facility]) [phone] MCG/ACT AEPB Inhale 1 puff twice a day into the lungs 04/15/20   Nehemiah Settle, FNP  omeprazole (PRILOSEC) 40 MG capsule TAKE 1 CAPSULE BY MOUTH DAILY FOR 30 DAYS. 03/25/20   [provider]  predniSONE  (DELTASONE) 10 MG tablet Take 2 at breakfast on day one Day 2 and 3 take 2 pills at breakfast and 2 at lunch Day 4 take 2 at breakfast Day 5 take 1 pill at breakfast Patient not taking: Reported on 04/15/2020 03/22/20   Alfonse Spruce, MD  valACYclovir (VALTREX) 1000 MG tablet Take 1 tablet (1,000 mg total) by mouth 2 (two) times daily. 12/10/18   Marylene Land, CNM    Allergies    Effexor [venlafaxine], Other, and Shellfish allergy  Review of Systems   Review of Systems  All other systems reviewed and are negative. Ten systems reviewed and are negative for acute change, except as noted in the HPI.   Physical Exam Updated Vital Signs BP 116/72 (BP Location: Right Arm)   Pulse 62   Temp 98.1 F (36.7 C) (Oral)   Resp 16   Ht 5' (1.524 m)   Wt 80 kg   SpO2 96%   BMI 34.44 kg/m   Physical Exam Vitals and nursing note reviewed. Exam conducted with a chaperone present.  Constitutional:      General: She is not in acute distress.    Appearance: Normal appearance. She is not ill-appearing, toxic-appearing or diaphoretic.  HENT:     Head: Normocephalic and atraumatic.     Right Ear: External ear normal.     Left Ear: External ear normal.     Nose: Nose normal.     Mouth/Throat:     Mouth: Mucous membranes are moist.     Pharynx: Oropharynx is clear. No oropharyngeal exudate or posterior oropharyngeal erythema.  Eyes:     Extraocular Movements: Extraocular movements intact.  Cardiovascular:     Rate and Rhythm: Normal rate and regular rhythm.     Pulses: Normal pulses.     Heart sounds: Normal heart sounds. No murmur heard. No friction rub. No gallop.   Pulmonary:     Effort: Pulmonary effort is normal. No respiratory distress.     Breath sounds: Normal breath sounds. No stridor. No wheezing, rhonchi or rales.  Abdominal:     General: Abdomen is flat.     Palpations: Abdomen is soft.     Tenderness: There is abdominal tenderness.     Comments: Mild  tenderness noted along the suprapubic region.   Genitourinary:    Comments: Normal-appearing vulvar anatomy.  Normal-appearing vaginal mucosa.  Closed cervical os.  No increased friability.  Small amount of white discharge noted in the vaginal vault.  No cervical motion tenderness.  No adnexal tenderness.  Female nursing chaperone present. Musculoskeletal:        General: Normal range of motion.     Cervical back: Normal range of motion and neck supple. No tenderness.  Skin:    General: Skin is warm and dry.  Neurological:     General: No focal deficit present.  Mental Status: She is alert and oriented to person, place, and time.  Psychiatric:        Mood and Affect: Mood normal.        Behavior: Behavior normal.    ED Results / Procedures / Treatments   Labs (all labs ordered are listed, but only abnormal results are displayed) Labs Reviewed  WET PREP, GENITAL - Abnormal; Notable for the following components:      Result Value   Clue Cells Wet Prep HPF POC PRESENT (*)    WBC, Wet Prep HPF POC FEW (*)    All other components within normal limits  URINE CULTURE  URINALYSIS, ROUTINE W REFLEX MICROSCOPIC  POC URINE PREG, ED  GC/CHLAMYDIA PROBE AMP ([place]) NOT AT [facility]   EKG None  Radiology No results found.  Procedures Procedures (including critical care time)  Medications Ordered in ED Medications - No data to display  ED Course  I have reviewed the triage vital signs and the nursing notes.  Pertinent labs & imaging results that were available during my care of the patient were reviewed by me and considered in my medical decision making (see chart for details).  Clinical Course as of 04/29/20 0751  Thu Apr 28, 2020  1443 Clue Cells Wet Prep HPF POC(!): PRESENT [LJ]  1444 WBC, Wet Prep HPF POC(!): FEW [LJ]  1444 Urinalysis, Routine w reflex microscopic No acute abnormalities [LJ]    Clinical Course User Index [LJ] Placido Sou, PA-C   MDM  Rules/Calculators/A&P                          Pt presents today due to increase in vaginal discharge as well as vaginal irritation and dysuria. History of recurrent BV and yeast, per patient.  No adnexal tenderness or CMT on my exam. Wet prep showing clue cells and few WBCs. No yeast or trich. UA without abnormalities.  Will d/c on a course of flagyl. Discussed safe sex practices. Pt planning to check results of gonorrhea/chlamydia test on MyChart. Return to ED with worsening sx. Her questions were answered and she was amicable at the time of d/c.  Final Clinical Impression(s) / ED Diagnoses Final diagnoses:  BV (bacterial vaginosis)    Rx / DC Orders ED Discharge Orders         Ordered    metroNIDAZOLE (FLAGYL) 500 MG tablet  2 times daily        04/28/20 1445           Placido Sou, PA-C 04/29/20 0758    Virgina Norfolk, DO 04/29/20 [phone]

## 2020-04-28 NOTE — Discharge Instructions (Addendum)
I am prescribing you an antibiotic called Flagyl.  You are going to take this twice a day for the next 7 days.  Do not stop taking this early.  If any of your symptoms worsen, please return to the emergency department.  Otherwise, please follow-up with your OB/GYN.  Please check the results of your gonorrhea/chlamydia test on MyChart in the next 1 to 2 days.  If either of these are positive, you need to return to the emergency department for treatment.

## 2020-04-29 LAB — GC/CHLAMYDIA PROBE AMP (~~LOC~~) NOT AT ARMC
Chlamydia: NEGATIVE
Comment: NEGATIVE
Comment: NORMAL
Neisseria Gonorrhea: NEGATIVE

## 2020-04-29 LAB — URINE CULTURE: Culture: <10000 — AB

## 2020-05-16 ENCOUNTER — Other Ambulatory Visit: Payer: Self-pay | Admitting: Family

## 2020-05-16 NOTE — Telephone Encounter (Signed)
Can you all see if this medication is not covered even with the coupon? If it is not covered what are the alternatives?  Thank you, Chrisitne

## 2020-05-17 NOTE — Telephone Encounter (Signed)
Advair HFA 230/21 mc was sent in to replace Airduo.

## 2020-05-17 NOTE — Telephone Encounter (Signed)
PA initiated through [email or web address] a waiting approval.  

## 2020-05-17 NOTE — Telephone Encounter (Signed)
Can you please send in a prescription for Advair HFA 230/21 mcg 2 puffs twice a day with spacer. Quantity # 1 with 3 refills. Thank you!

## 2020-05-17 NOTE — Telephone Encounter (Signed)
PA was denied for AirDuo. It stated that preferred alternatives are Advair Diskus, Advair HFA, Breo Symbicort.

## 2020-06-06 ENCOUNTER — Ambulatory Visit: Payer: 59 | Admitting: Nurse Practitioner

## 2020-06-20 ENCOUNTER — Other Ambulatory Visit: Payer: Self-pay | Admitting: Student

## 2020-06-30 ENCOUNTER — Ambulatory Visit: Payer: 59 | Admitting: Allergy & Immunology

## 2020-07-18 ENCOUNTER — Ambulatory Visit: Payer: 59 | Admitting: Nurse Practitioner

## 2020-07-18 ENCOUNTER — Other Ambulatory Visit: Payer: Self-pay

## 2020-07-18 ENCOUNTER — Other Ambulatory Visit (HOSPITAL_COMMUNITY)
Admission: RE | Admit: 2020-07-18 | Discharge: 2020-07-18 | Disposition: A | Discharge status: Home / Self Care | Payer: 59 | Source: Ambulatory Visit | Attending: Nurse Practitioner | Admitting: Nurse Practitioner

## 2020-07-18 ENCOUNTER — Encounter: Payer: Self-pay | Admitting: Nurse Practitioner

## 2020-07-18 ENCOUNTER — Other Ambulatory Visit: Payer: Self-pay | Admitting: Pharmacist

## 2020-07-18 VITALS — BP 115/68 | HR 68 | Temp 97.8°F | Ht 60.0 in | Wt 176.2 lb

## 2020-07-18 DIAGNOSIS — B9689 Other specified bacterial agents as the cause of diseases classified elsewhere: Secondary | ICD-10-CM | POA: Diagnosis not present

## 2020-07-18 DIAGNOSIS — N898 Other specified noninflammatory disorders of vagina: Secondary | ICD-10-CM | POA: Insufficient documentation

## 2020-07-18 DIAGNOSIS — Z113 Encounter for screening for infections with a predominantly sexual mode of transmission: Secondary | ICD-10-CM | POA: Insufficient documentation

## 2020-07-18 DIAGNOSIS — N3289 Other specified disorders of bladder: Secondary | ICD-10-CM

## 2020-07-18 DIAGNOSIS — N760 Acute vaginitis: Secondary | ICD-10-CM | POA: Insufficient documentation

## 2020-07-18 LAB — POCT URINE PREGNANCY: Preg Test, Ur: NEGATIVE

## 2020-07-18 MED ORDER — VALACYCLOVIR HCL 1 G PO TABS: 1000.0000 mg | ORAL_TABLET | Freq: Two times a day (BID) | ORAL | 0 refills | Status: DC

## 2020-07-18 NOTE — Patient Instructions (Addendum)
Go to lab for blood draw and urine collection  Stop all caffeine drinks  Thank you for choosing [facility]  Kegel Exercises  Kegel exercises can help strengthen your pelvic floor muscles. The pelvic floor is a group of muscles that support your rectum, small intestine, and bladder. In females, pelvic floor muscles also help support the womb (uterus). These muscles help you control the flow of urine and stool. Kegel exercises are painless and simple, and they do not require any equipment. Your provider may suggest Kegel exercises to:  Improve bladder and bowel control.  Improve sexual response.  Improve weak pelvic floor muscles after surgery to remove the uterus (hysterectomy) or pregnancy (females).  Improve weak pelvic floor muscles after prostate gland removal or surgery (males). Kegel exercises involve squeezing your pelvic floor muscles, which are the same muscles you squeeze when you try to stop the flow of urine or keep from passing gas. The exercises can be done while sitting, standing, or lying down, but it is best to vary your position. Exercises How to do Kegel exercises: 1. Squeeze your pelvic floor muscles tight. You should feel a tight lift in your rectal area. If you are a female, you should also feel a tightness in your vaginal area. Keep your stomach, buttocks, and legs relaxed. 2. Hold the muscles tight for up to 10 seconds. 3. Breathe normally. 4. Relax your muscles. 5. Repeat as told by your health care provider. Repeat this exercise daily as told by your health care provider. Continue to do this exercise for at least 4-6 weeks, or for as long as told by your health care provider. You may be referred to a physical therapist who can help you learn more about how to do Kegel exercises. Depending on your condition, your health care provider may recommend:  Varying how long you squeeze your muscles.  Doing several sets of exercises every day.  Doing  exercises for several weeks.  Making Kegel exercises a part of your regular exercise routine. This information is not intended to replace advice given to you by your health care provider. Make sure you discuss any questions you have with your health care provider. Document Revised: 09/04/2019 Document Reviewed: 12/18/2017 Elsevier Patient Education  2021 [employer].

## 2020-07-18 NOTE — Progress Notes (Signed)
Subjective:  Patient ID: Danielle Velez, female    DOB: 07-20-1990  Age: 30 y.o. MRN: [id number]  CC: Establish Care (NP/establish care, patient has concerns about irregular periods odor after periods also having bladder spasms going on few years not getting better. )  Vaginal Discharge The patient's primary symptoms include a genital odor and vaginal discharge. The patient's pertinent negatives include no genital itching, genital lesions, genital rash, missed menses, pelvic pain or vaginal bleeding. This is a new problem. The current episode started in the past 7 days. The problem occurs constantly. The problem has been unchanged. The patient is experiencing no pain. The problem affects both sides. She is not pregnant. Associated symptoms include frequency and urgency. Pertinent negatives include no abdominal pain, chills, flank pain, hematuria, nausea, painful intercourse or vomiting. The vaginal discharge was milky and thick. There has been no bleeding. She has not been passing clots. She has not been passing tissue. Nothing aggravates the symptoms. She is sexually active. It is unknown whether or not her partner has an STD. She uses nothing for contraception. Her menstrual history has been regular. Her past medical history is significant for miscarriage and vaginosis. There is no history of endometriosis, menorrhagia or metrorrhagia.  Urinary Frequency  This is a chronic problem. The current episode started more than 1 year ago. The problem occurs intermittently. The problem has been unchanged. The pain is at a severity of 0/10. The patient is experiencing no pain. There has been no fever. She is sexually active. There is no history of pyelonephritis. Associated symptoms include frequency and urgency. Pertinent negatives include no chills, discharge, flank pain, hematuria, hesitancy, nausea, sweats or vomiting. She has tried nothing for the symptoms. There is no history of urinary stasis.   Menstrual  cycle every 28-30days. Last 3-4days, LMP 07/14/2020.  Caffeine intake: 300mg  per day (pre workout)  Reviewed past Medical, Social and Family history today.  Outpatient Medications Prior to Visit  Medication Sig Dispense Refill  . albuterol (PROVENTIL) (2.5 MG/3ML) 0.083% nebulizer solution Take 3 mLs (2.5 mg total) by nebulization every 6 (six) hours as needed for wheezing or shortness of breath. 75 mL 0  . albuterol (VENTOLIN HFA) 108 (90 Base) MCG/ACT inhaler INHALE 4 PUFFS INTO THE LUNGS EVERY 4 HOURS AS NEEDED FOR WHEEZING OR SHORTNESS OF BREATH (COUGH). 18 g 1  . budesonide-formoterol (SYMBICORT) 160-4.5 MCG/ACT inhaler TAKE 2 PUFFS BY MOUTH TWICE A DAY (Patient taking differently: Inhale 2 puffs into the lungs in the morning and at bedtime.) 30.6 g 5  . cetirizine (ZYRTEC) 10 MG tablet Take 1 tablet (10 mg total) by mouth daily. 60 tablet 5  . valACYclovir (VALTREX) 1000 MG tablet Take 1 tablet (1,000 mg total) by mouth 2 (two) times daily. 10 tablet 5  . EPINEPHrine 0.3 mg/0.3 mL IJ SOAJ injection Inject 0.3 mg into the muscle as needed for anaphylaxis. (Patient not taking: Reported on 07/18/2020) 2 each 1  . fluticasone-salmeterol (ADVAIR HFA) 230-21 MCG/ACT inhaler Inhale 2 puffs into the lungs 2 (two) times daily. (Patient not taking: Reported on 07/18/2020) 12 g 5  . omeprazole (PRILOSEC) 40 MG capsule Take 40 mg by mouth daily. (Patient not taking: Reported on 07/18/2020)     Facility-Administered Medications Prior to Visit  Medication Dose Route Frequency Provider Last Rate Last Admin  . omalizumab 09/17/2020) injection 300 mg  300 mg Subcutaneous Q28 days Geoffry Paradise, MD   300 mg at 09/16/19 0919    ROS See HPI  Objective:  BP 115/68   Pulse 68   Temp 97.8 F (36.6 C) (Temporal)   Ht 5' (1.524 m)   Wt 176 lb 3.2 oz (79.9 kg)   SpO2 97%   BMI 34.41 kg/m   Physical Exam Exam conducted with a chaperone present.  Genitourinary:    Labia:        Right: No rash,  tenderness or lesion.        Left: No rash, tenderness or lesion.      Vagina: Vaginal discharge present. No erythema or tenderness.     Cervix: Discharge present. No cervical motion tenderness, friability, lesion or erythema.     Adnexa: Right adnexa normal and left adnexa normal.  Lymphadenopathy:     Lower Body: No right inguinal adenopathy. No left inguinal adenopathy.  Neurological:     Mental Status: She is alert and oriented to person, place, and time.    Assessment & Plan:  This visit occurred during the SARS-CoV-2 public health emergency.  Safety protocols were in place, including screening questions prior to the visit, additional usage of staff PPE, and extensive cleaning of exam room while observing appropriate contact time as indicated for disinfecting solutions.   Marlisha was seen today for establish care.  Diagnoses and all orders for this visit:  BV (bacterial vaginosis) -     Cervicovaginal ancillary only( [place]) -     POCT urine pregnancy -     metroNIDAZOLE (METROGEL VAGINAL) 0.75 % vaginal gel; Place 1 Applicatorful vaginally at bedtime.  Screen for STD (sexually transmitted disease) -     Cervicovaginal ancillary only( [place]) -     HIV antibody (with reflex) -     Hepatitis C Antibody  Bladder spasm  Bladder spasm is due to high caffeine consumption. Advised to stop all caffeine drinks and start kegel exercise daily.  Problem List Items Addressed This Visit      Other   Vaginal discharge - Primary   Relevant Medications   metroNIDAZOLE (METROGEL VAGINAL) 0.75 % vaginal gel    Other Visit Diagnoses    Screen for STD (sexually transmitted disease)       Relevant Orders   Cervicovaginal ancillary only( [place]) (Completed)   HIV antibody (with reflex) (Completed)   Hepatitis C Antibody (Completed)   Bladder spasm          Follow-up: Return in about 4 weeks (around 08/15/2020) for CPE (fasting).  Alysia Penna, NP

## 2020-07-19 LAB — HIV ANTIBODY (ROUTINE TESTING W REFLEX): HIV 1&2 Ab, 4th Generation: NONREACTIVE

## 2020-07-19 LAB — HEPATITIS C ANTIBODY
Hepatitis C Ab: NONREACTIVE
SIGNAL TO CUT-OFF: 0.01 (ref <1.00)

## 2020-07-20 ENCOUNTER — Telehealth: Payer: Self-pay | Admitting: Nurse Practitioner

## 2020-07-20 ENCOUNTER — Encounter: Payer: Self-pay | Admitting: Nurse Practitioner

## 2020-07-20 DIAGNOSIS — B9689 Other specified bacterial agents as the cause of diseases classified elsewhere: Secondary | ICD-10-CM

## 2020-07-20 DIAGNOSIS — N760 Acute vaginitis: Secondary | ICD-10-CM

## 2020-07-20 LAB — CERVICOVAGINAL ANCILLARY ONLY
Bacterial Vaginitis (gardnerella): POSITIVE — AB
Candida Glabrata: NEGATIVE
Candida Vaginitis: NEGATIVE
Chlamydia: NEGATIVE
Comment: NEGATIVE
Comment: NEGATIVE
Comment: NEGATIVE
Comment: NEGATIVE
Comment: NEGATIVE
Comment: NORMAL
Neisseria Gonorrhea: NEGATIVE
Trichomonas: NEGATIVE

## 2020-07-20 MED ORDER — METRONIDAZOLE 0.75 % VA GEL: 1.0000 | Freq: Every day | VAGINAL | 0 refills | Status: DC

## 2020-07-20 MED ORDER — METRONIDAZOLE 500 MG PO TABS: 500.0000 mg | ORAL_TABLET | Freq: Two times a day (BID) | ORAL | 0 refills | Status: DC

## 2020-07-20 NOTE — Telephone Encounter (Signed)
-----   Message from Priscella Mann, [place] sent at 07/20/2020  1:48 PM EST ----- Patient notified and verbalized understanding Pt would like pill form sent in because she states the gel does not work for her.

## 2020-09-08 ENCOUNTER — Other Ambulatory Visit: Payer: Self-pay

## 2020-09-09 ENCOUNTER — Ambulatory Visit: Payer: 59 | Admitting: Nurse Practitioner

## 2020-09-09 ENCOUNTER — Other Ambulatory Visit (HOSPITAL_COMMUNITY)
Admission: RE | Admit: 2020-09-09 | Discharge: 2020-09-09 | Disposition: A | Discharge status: Home / Self Care | Payer: 59 | Source: Ambulatory Visit | Attending: Nurse Practitioner | Admitting: Nurse Practitioner

## 2020-09-09 ENCOUNTER — Encounter: Payer: Self-pay | Admitting: Nurse Practitioner

## 2020-09-09 ENCOUNTER — Telehealth: Payer: Self-pay | Admitting: Nurse Practitioner

## 2020-09-09 VITALS — BP 122/80 | HR 78 | Temp 98.1°F | Ht 60.0 in | Wt 180.2 lb

## 2020-09-09 DIAGNOSIS — N912 Amenorrhea, unspecified: Secondary | ICD-10-CM

## 2020-09-09 DIAGNOSIS — B9689 Other specified bacterial agents as the cause of diseases classified elsewhere: Secondary | ICD-10-CM

## 2020-09-09 DIAGNOSIS — Z8759 Personal history of other complications of pregnancy, childbirth and the puerperium: Secondary | ICD-10-CM

## 2020-09-09 DIAGNOSIS — N760 Acute vaginitis: Secondary | ICD-10-CM | POA: Diagnosis not present

## 2020-09-09 DIAGNOSIS — Z113 Encounter for screening for infections with a predominantly sexual mode of transmission: Secondary | ICD-10-CM | POA: Insufficient documentation

## 2020-09-09 LAB — HCG, QUANTITATIVE, PREGNANCY: Quantitative HCG: 1669 m[IU]/mL

## 2020-09-09 NOTE — Telephone Encounter (Signed)
Pt would like a cb concerning her labs done today 09/09/20. Please advise when they  have been released at [phone].

## 2020-09-09 NOTE — Patient Instructions (Addendum)
Go to lab for blood draw Return to lab on Monday for repeat HCG.  Schedule appt with OB: [facility] or [facility] in [place], or [place].  [email or web address]  First Trimester of Pregnancy  The first trimester of pregnancy starts on the first day of your last menstrual period until the end of week 12. This is also called months 1 through 3 of pregnancy. Body changes during your first trimester Your body goes through many changes during pregnancy. The changes usually return to normal after your baby is born. Physical changes  You may gain or lose weight.  Your breasts may grow larger and hurt. The area around your nipples may get darker.  Dark spots or blotches may develop on your face.  You may have changes in your hair. Health changes  You may feel like you might vomit (nauseous), and you may vomit.  You may have heartburn.  You may have headaches.  You may have trouble pooping (constipation).  Your gums may bleed. Other changes  You may get tired easily.  You may pee (urinate) more often.  Your menstrual periods will stop.  You may not feel hungry.  You may want to eat certain kinds of food.  You may have changes in your emotions from day to day.  You may have more dreams. Follow these instructions at home: Medicines  Take over-the-counter and prescription medicines only as told by your doctor. Some medicines are not safe during pregnancy.  Take a prenatal vitamin that contains at least 600 micrograms (mcg) of folic acid. Eating and drinking  Eat healthy meals that include: ? Fresh fruits and vegetables. ? Whole grains. ? Good sources of protein, such as meat, eggs, or tofu. ? Low-fat dairy products.  Avoid raw meat and unpasteurized juice, milk, and cheese.  If you feel like you may vomit, or you vomit: ? Eat 4 or 5 small meals a day instead of 3 large meals. ? Try eating a few soda  crackers. ? Drink liquids between meals instead of during meals.  You may need to take these actions to prevent or treat trouble pooping: ? Drink enough fluids to keep your pee (urine) pale yellow. ? Eat foods that are high in fiber. These include beans, whole grains, and fresh fruits and vegetables. ? Limit foods that are high in fat and sugar. These include fried or sweet foods. Activity  Exercise only as told by your doctor. Most people can do their usual exercise routine during pregnancy.  Stop exercising if you have cramps or pain in your lower belly (abdomen) or low back.  Do not exercise if it is too hot or too humid, or if you are in a place of great height (high altitude).  Avoid heavy lifting.  If you choose to, you may have sex unless your doctor tells you not to. Relieving pain and discomfort  Wear a good support bra if your breasts are sore.  Rest with your legs raised (elevated) if you have leg cramps or low back pain.  If you have bulging veins (varicose veins) in your legs: ? Wear support hose as told by your doctor. ? Raise your feet for 15 minutes, 3-4 times a day. ? Limit salt in your food. Safety  Wear your seat belt at all times when you are in a car.  Talk with your doctor if someone is hurting you or yelling at you.  Talk with your doctor if you are feeling sad or  have thoughts of hurting yourself. Lifestyle  Do not use hot tubs, steam rooms, or saunas.  Do not douche. Do not use tampons or scented sanitary pads.  Do not use herbal medicines, illegal drugs, or medicines that are not approved by your doctor. Do not drink alcohol.  Do not smoke or use any products that contain nicotine or tobacco. If you need help quitting, ask your doctor.  Avoid cat litter boxes and soil that is used by cats. These carry germs that can cause harm to the baby and can cause a loss of your baby by miscarriage or stillbirth. General instructions  Keep all follow-up  visits. This is important.  Ask for help if you need counseling or if you need help with nutrition. Your doctor can give you advice or tell you where to go for help.  Visit your dentist. At home, brush your teeth with a soft toothbrush. Floss gently.  Write down your questions. Take them to your prenatal visits. Where to find more information  American Pregnancy Association: americanpregnancy.org  [employer]: www.acog.org  Office on Women's Health: MightyReward.co.nz Contact a doctor if:  You are dizzy.  You have a fever.  You have mild cramps or pressure in your lower belly.  You have a nagging pain in your belly area.  You continue to feel like you may vomit, you vomit, or you have watery poop (diarrhea) for 24 hours or longer.  You have a bad-smelling fluid coming from your vagina.  You have pain when you pee.  You are exposed to a disease that spreads from person to person, such as chickenpox, measles, Zika virus, HIV, or hepatitis. Get help right away if:  You have spotting or bleeding from your vagina.  You have very bad belly cramping or pain.  You have shortness of breath or chest pain.  You have any kind of injury, such as from a fall or a car crash.  You have new or increased pain, swelling, or redness in an arm or leg. Summary  The first trimester of pregnancy starts on the first day of your last menstrual period until the end of week 12 (months 1 through 3).  Eat 4 or 5 small meals a day instead of 3 large meals.  Do not smoke or use any products that contain nicotine or tobacco. If you need help quitting, ask your doctor.  Keep all follow-up visits. This information is not intended to replace advice given to you by your health care provider. Make sure you discuss any questions you have with your health care provider. Document Revised: 10/07/2019 Document Reviewed: 08/13/2019 Elsevier Patient Education   2021 [employer].

## 2020-09-09 NOTE — Telephone Encounter (Signed)
Call with lab results

## 2020-09-09 NOTE — Progress Notes (Signed)
Subjective:  Patient ID: Danielle Velez, female    DOB: 29-Feb-1991  Age: 30 y.o. MRN: [id number]  CC: Acute Visit (Pt would like to confirm pregnancy)  HPI Danielle Velez present with positive urine home pregnancy test yesterday. LMP 07/31/2020. She had an ectopic pregnancy 78yrs ago, so she is concerned about possible reoccurrence. Today she reports mild pelvic pressure and breasts tenderness. No vaginal bleeding/discharge, no ABD pain. She is also requesting for repeat STD screen  Reviewed past Medical, Social and Family history today.  Outpatient Medications Prior to Visit  Medication Sig Dispense Refill  . albuterol (PROVENTIL) (2.5 MG/3ML) 0.083% nebulizer solution Take 3 mLs (2.5 mg total) by nebulization every 6 (six) hours as needed for wheezing or shortness of breath. 75 mL 0  . albuterol (VENTOLIN HFA) 108 (90 Base) MCG/ACT inhaler INHALE 4 PUFFS INTO THE LUNGS EVERY 4 HOURS AS NEEDED FOR WHEEZING OR SHORTNESS OF BREATH (COUGH). 18 g 1  . budesonide-formoterol (SYMBICORT) 160-4.5 MCG/ACT inhaler TAKE 2 PUFFS BY MOUTH TWICE A DAY (Patient taking differently: Inhale 2 puffs into the lungs in the morning and at bedtime.) 30.6 g 5  . cetirizine (ZYRTEC) 10 MG tablet Take 1 tablet (10 mg total) by mouth daily. 60 tablet 5  . valACYclovir (VALTREX) 1000 MG tablet Take 1 tablet (1,000 mg total) by mouth 2 (two) times daily. Needs yearly appointment 10 tablet 0  . metroNIDAZOLE (FLAGYL) 500 MG tablet Take 1 tablet (500 mg total) by mouth 2 (two) times daily. (Patient not taking: Reported on 09/09/2020) 14 tablet 0   Facility-Administered Medications Prior to Visit  Medication Dose Route Frequency Provider Last Rate Last Admin  . omalizumab Geoffry Paradise) injection 300 mg  300 mg Subcutaneous Q28 days Alfonse Spruce, MD   300 mg at 09/16/19 0919    ROS See HPI  Objective:  BP 122/80 (BP Location: Left Arm, Patient Position: Sitting, Cuff Size: Large)   Pulse 78   Temp 98.1 F (36.7 C)  (Temporal)   Ht 5' (1.524 m)   Wt 180 lb 3.2 oz (81.7 kg)   LMP 07/31/2020 (Exact Date)   SpO2 100%   BMI 35.19 kg/m   Physical Exam Exam conducted with a chaperone present.  Constitutional:      General: She is not in acute distress. Genitourinary:    Pubic Area: No rash.      Labia:        Right: No rash, tenderness or lesion.        Left: No rash, tenderness or lesion.      Vagina: Normal.     Cervix: Normal.     Adnexa: Right adnexa normal and left adnexa normal.  Lymphadenopathy:     Lower Body: No right inguinal adenopathy. No left inguinal adenopathy.  Neurological:     Mental Status: She is alert and oriented to person, place, and time.  Psychiatric:        Mood and Affect: Mood normal.        Behavior: Behavior normal.        Thought Content: Thought content normal.    Assessment & Plan:  This visit occurred during the SARS-CoV-2 public health emergency.  Safety protocols were in place, including screening questions prior to the visit, additional usage of staff PPE, and extensive cleaning of exam room while observing appropriate contact time as indicated for disinfecting solutions.   Danielle Velez was seen today for acute visit.  Diagnoses and all orders for this visit:  Amenorrhea -     hCG, quantitative, pregnancy -     hCG, quantitative, pregnancy; Future  Screen for STD (sexually transmitted disease) -     Cervicovaginal ancillary only( [place]) -     HIV antibody (with reflex) -     RPR  History of ectopic pregnancy -     hCG, quantitative, pregnancy; Future  Go to lab for blood draw today Return to lab on Monday for repeat HCG. Schedule appt with OB: [facility] or [facility] in [place], or [place].  Problem List Items Addressed This Visit   None   Visit Diagnoses    Amenorrhea    -  Primary   Relevant Orders   hCG, quantitative, pregnancy   hCG, quantitative, pregnancy   Screen for STD (sexually transmitted disease)        Relevant Orders   Cervicovaginal ancillary only( [place])   HIV antibody (with reflex)   RPR   History of ectopic pregnancy       Relevant Orders   hCG, quantitative, pregnancy      Follow-up: No follow-ups on file.  Alysia Penna, NP

## 2020-09-09 NOTE — Telephone Encounter (Signed)
Patient notified and verbalized understanding. 

## 2020-09-12 ENCOUNTER — Other Ambulatory Visit (INDEPENDENT_AMBULATORY_CARE_PROVIDER_SITE_OTHER): Payer: 59

## 2020-09-12 ENCOUNTER — Encounter: Payer: Self-pay | Admitting: Nurse Practitioner

## 2020-09-12 ENCOUNTER — Other Ambulatory Visit: Payer: Self-pay

## 2020-09-12 ENCOUNTER — Emergency Department (HOSPITAL_COMMUNITY): Payer: 59

## 2020-09-12 ENCOUNTER — Encounter (HOSPITAL_COMMUNITY): Payer: Self-pay

## 2020-09-12 ENCOUNTER — Emergency Department (HOSPITAL_COMMUNITY)
Admission: EM | Admit: 2020-09-12 | Discharge: 2020-09-13 | Disposition: A | Discharge status: Home / Self Care | Payer: 59 | Attending: Emergency Medicine | Admitting: Emergency Medicine

## 2020-09-12 DIAGNOSIS — Z3491 Encounter for supervision of normal pregnancy, unspecified, first trimester: Secondary | ICD-10-CM

## 2020-09-12 DIAGNOSIS — R102 Pelvic and perineal pain: Secondary | ICD-10-CM | POA: Insufficient documentation

## 2020-09-12 DIAGNOSIS — N912 Amenorrhea, unspecified: Secondary | ICD-10-CM | POA: Diagnosis not present

## 2020-09-12 DIAGNOSIS — Z3A01 Less than 8 weeks gestation of pregnancy: Secondary | ICD-10-CM | POA: Insufficient documentation

## 2020-09-12 DIAGNOSIS — O26899 Other specified pregnancy related conditions, unspecified trimester: Secondary | ICD-10-CM

## 2020-09-12 DIAGNOSIS — O26891 Other specified pregnancy related conditions, first trimester: Secondary | ICD-10-CM | POA: Diagnosis not present

## 2020-09-12 DIAGNOSIS — Z8759 Personal history of other complications of pregnancy, childbirth and the puerperium: Secondary | ICD-10-CM

## 2020-09-12 DIAGNOSIS — M545 Low back pain, unspecified: Secondary | ICD-10-CM

## 2020-09-12 DIAGNOSIS — J45909 Unspecified asthma, uncomplicated: Secondary | ICD-10-CM | POA: Diagnosis not present

## 2020-09-12 LAB — CBC WITH DIFFERENTIAL/PLATELET
Abs Immature Granulocytes: 0.06 10*3/uL (ref 0.00–0.07)
Basophils Absolute: 0.1 10*3/uL (ref 0.0–0.1)
Basophils Relative: 1 %
Eosinophils Absolute: 0.2 10*3/uL (ref 0.0–0.5)
Eosinophils Relative: 3 %
HCT: 41.5 % (ref 36.0–46.0)
Hemoglobin: 13.7 g/dL (ref 12.0–15.0)
Immature Granulocytes: 1 %
Lymphocytes Relative: 33 %
Lymphs Abs: 2.9 10*3/uL (ref 0.7–4.0)
MCH: 27.8 pg (ref 26.0–34.0)
MCHC: 33 g/dL (ref 30.0–36.0)
MCV: 84.3 fL (ref 80.0–100.0)
Monocytes Absolute: 0.6 10*3/uL (ref 0.1–1.0)
Monocytes Relative: 7 %
Neutro Abs: 4.8 10*3/uL (ref 1.7–7.7)
Neutrophils Relative %: 55 %
Platelets: 309 10*3/uL (ref 150–400)
RBC: 4.92 MIL/uL (ref 3.87–5.11)
RDW: 13.7 % (ref 11.5–15.5)
WBC: 8.6 10*3/uL (ref 4.0–10.5)
nRBC: 0 % (ref 0.0–0.2)

## 2020-09-12 LAB — CERVICOVAGINAL ANCILLARY ONLY
Bacterial Vaginitis (gardnerella): POSITIVE — AB
Candida Glabrata: NEGATIVE
Candida Vaginitis: NEGATIVE
Chlamydia: NEGATIVE
Comment: NEGATIVE
Comment: NEGATIVE
Comment: NEGATIVE
Comment: NEGATIVE
Comment: NEGATIVE
Comment: NORMAL
Neisseria Gonorrhea: NEGATIVE
Trichomonas: NEGATIVE

## 2020-09-12 LAB — COMPREHENSIVE METABOLIC PANEL
ALT: 33 U/L (ref 0–44)
AST: 22 U/L (ref 15–41)
Albumin: 4.2 g/dL (ref 3.5–5.0)
Alkaline Phosphatase: 60 U/L (ref 38–126)
Anion gap: 9 (ref 5–15)
BUN: 15 mg/dL (ref 6–20)
CO2: 20 mmol/L — ABNORMAL LOW (ref 22–32)
Calcium: 9.1 mg/dL (ref 8.9–10.3)
Chloride: 106 mmol/L (ref 98–111)
Creatinine, Ser: 0.85 mg/dL (ref 0.44–1.00)
GFR, Estimated: >60 mL/min (ref >60)
Glucose, Bld: 81 mg/dL (ref 70–99)
Potassium: 4.1 mmol/L (ref 3.5–5.1)
Sodium: 135 mmol/L (ref 135–145)
Total Bilirubin: 0.2 mg/dL — ABNORMAL LOW (ref 0.3–1.2)
Total Protein: 8.1 g/dL (ref 6.5–8.1)

## 2020-09-12 LAB — URINALYSIS, ROUTINE W REFLEX MICROSCOPIC
Bacteria, UA: NONE SEEN
Bilirubin Urine: NEGATIVE
Glucose, UA: NEGATIVE mg/dL
Hgb urine dipstick: NEGATIVE
Ketones, ur: NEGATIVE mg/dL
Nitrite: NEGATIVE
Protein, ur: NEGATIVE mg/dL
Specific Gravity, Urine: 1.014 (ref 1.005–1.030)
pH: 6 (ref 5.0–8.0)

## 2020-09-12 LAB — RPR: RPR Ser Ql: NONREACTIVE

## 2020-09-12 LAB — HCG, QUANTITATIVE, PREGNANCY
Quantitative HCG: 5307 m[IU]/mL
hCG, Beta Chain, Quant, S: 8495 m[IU]/mL — ABNORMAL HIGH (ref <5)

## 2020-09-12 LAB — HIV ANTIBODY (ROUTINE TESTING W REFLEX): HIV 1&2 Ab, 4th Generation: NONREACTIVE

## 2020-09-12 MED ORDER — CLINDAMYCIN HCL 300 MG PO CAPS: 300.0000 mg | ORAL_CAPSULE | Freq: Two times a day (BID) | ORAL | 0 refills | Status: DC

## 2020-09-12 NOTE — Progress Notes (Signed)
Per orders of NP Alysia Penna pt is here for repeat labs, pt tolerated draw well.

## 2020-09-12 NOTE — ED Triage Notes (Signed)
Emergency Medicine Provider Triage Evaluation Note  Danielle Velez , a 30 y.o. female  was evaluated in triage.  Pt complains of cramping in early pregnancy. LMP 08/08/20, 5 weeks by dates, quant yesterday 5,000. Took Flagyl today for BV and then developed pelvic cramping, more so on the right, with low back pain. Denies vaginal bleeding. G3P1 Reports prior ectopic, states left tube is damaged.   Review of Systems  Positive: Pelvic cramping, low back pain Negative: Vomiting, vaginal bleeding  Physical Exam  BP 111/66 (BP Location: Left Arm)   Pulse 68   Temp 98.2 F (36.8 C) (Oral)   Resp 16   SpO2 100%  Gen:   Awake, no distress   HEENT:  Atraumatic  Resp:  Normal effort  Cardiac:  Normal rate  Abd:   Nondistended, nontender  MSK:   Moves extremities without difficulty  Neuro:  Speech clear   Medical Decision Making  Medically screening exam initiated at 9:58 PM.  Appropriate orders placed.  Danielle Velez was informed that the remainder of the evaluation will be completed by another provider, this initial triage assessment does not replace that evaluation, and the importance of remaining in the ED until their evaluation is complete.  Clinical Impression     Danielle Velez 09/12/20 2159

## 2020-09-12 NOTE — Telephone Encounter (Signed)
Please see message . Thank you .

## 2020-09-12 NOTE — ED Triage Notes (Addendum)
Pt c/o R sided pelvic pain x2-3 days, denies any vaginal discharge or bleeding. States she is approx [redacted] wks pregnant. Denies urinary symptoms. States she was dx with BV and started taking left over flagyl prescription today

## 2020-09-12 NOTE — Addendum Note (Signed)
Addended by: Michaela Corner on: 09/12/2020 03:32 PM   Modules accepted: Orders

## 2020-09-13 MED ORDER — PRENATAL COMPLETE 14-0.4 MG PO TABS: 1.0000 | ORAL_TABLET | Freq: Every day | ORAL | 0 refills | Status: DC

## 2020-09-13 NOTE — Discharge Instructions (Addendum)
Prenatal vitamins have been sent to pharmacy for you.  Take as directed. Follow-up with OB/GYN.  You will need repeat ultrasound in 1 to 2 weeks to confirm cardiac activity. If pregnancy related complications arise, please go to [facility]U at [facility] to see OB/GYN.  This is entrance C.

## 2020-09-13 NOTE — ED Provider Notes (Signed)
[facility]-EMERGENCY DEPT Provider Note   CSN: [id number] Arrival date & time: 09/12/20  2100     History Chief Complaint  Patient presents with  . Pelvic Pain    Danielle Velez is a 30 y.o. female.  The history is provided by the patient and medical records.  Pelvic Pain    30 y.o. G3P1 approx [redacted] weeks gestation here with pelvic pain for the past several days.  States it is all throughout the pelvis but somewhat worse on the right side.  She has not had any abnormal vaginal bleeding or discharge.  She was diagnosed with BV by her PCP last week and had started taking leftover Flagyl but her doctor called in clindamycin today.  She has not had any fever or back pain.  No urinary symptoms.  States she has history of ectopic pregnancy in the past and was concerned for same.  She has not yet seen OB or started prenatal vitamins.  Past Medical History:  Diagnosis Date  . Angio-edema   . Asthma   . Recurrent upper respiratory infection (URI)   . Urticaria     Patient Active Problem List   Diagnosis Date Noted  . 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 Vaginal discharge 07/30/2018  . Upper respiratory tract infection 06/05/2018  . Seasonal and perennial allergic rhinitis 06/05/2018  . History of herpes labialis 01/22/2018  . Folliculitis 01/22/2018  . Chest pain on breathing 11/11/2017  . Possible pregnancy 11/11/2017  . Adverse food reaction 11/11/2017  . Recurrent pleurisy 07/19/2016  . Moderate persistent asthma 03/22/2015  . Food allergy 03/22/2015  . Moderate persistent asthma with acute exacerbation 03/09/2015  . Allergic rhinitis 03/09/2015    Past Surgical History:  Procedure Laterality Date  . CESAREAN SECTION    . CESAREAN SECTION  09/16/2010     OB History    Gravida  1   Para      Term      Preterm      AB      Living        SAB      IAB      Ectopic      Multiple      Live Births               Family History  Problem Relation Age of Onset  . Asthma Maternal Grandmother   . Lupus Mother   . Healthy Father   . Allergic rhinitis Neg Hx   . Angioedema Neg Hx   . Atopy Neg Hx   . Eczema Neg Hx   . Immunodeficiency Neg Hx   . Urticaria Neg Hx     Social History   Tobacco Use  . Smoking status: Never Smoker  . Smokeless tobacco: Never Used  Vaping Use  . Vaping Use: Never used  Substance Use Topics  . Alcohol use: No    Alcohol/week: 0.0 standard drinks  . Drug use: No    Home Medications Prior to Admission medications   Medication Sig Start Date End Date Taking? Authorizing Provider  Prenatal Vit-Fe Fumarate-FA (PRENATAL COMPLETE) 14-0.4 MG TABS Take 1 tablet by mouth daily. 09/13/20  Yes Garlon Hatchet, PA-C  albuterol (PROVENTIL) (2.5 MG/3ML) 0.083% nebulizer solution Take 3 mLs (2.5 mg total) by nebulization every 6 (six) hours as needed for wheezing or shortness of breath. 02/25/20   Alfonse Spruce, MD  albuterol (VENTOLIN HFA) 108 (90 Base) MCG/ACT inhaler  INHALE 4 PUFFS INTO THE LUNGS EVERY 4 HOURS AS NEEDED FOR WHEEZING OR SHORTNESS OF BREATH (COUGH). 02/25/20   Alfonse Spruce, MD  budesonide-formoterol (SYMBICORT) 160-4.5 MCG/ACT inhaler TAKE 2 PUFFS BY MOUTH TWICE A DAY Patient taking differently: Inhale 2 puffs into the lungs in the morning and at bedtime. 02/25/20   Alfonse Spruce, MD  cetirizine (ZYRTEC) 10 MG tablet Take 1 tablet (10 mg total) by mouth daily. 02/25/20   Alfonse Spruce, MD  clindamycin (CLEOCIN) 300 MG capsule Take 1 capsule (300 mg total) by mouth in the morning and at bedtime for 7 days. 09/12/20 09/19/20  Nche, Bonna Gains, NP  valACYclovir (VALTREX) 1000 MG tablet Take 1 tablet (1,000 mg total) by mouth 2 (two) times daily. Needs yearly appointment 07/18/20   Federico Flake, MD    Allergies    Effexor [venlafaxine], Other, and Shellfish allergy  Review of Systems   Review of Systems  Genitourinary:  Positive for pelvic pain.  All other systems reviewed and are negative.   Physical Exam Updated Vital Signs BP 117/66 (BP Location: Right Arm)   Pulse (!) 55   Temp 98.9 F (37.2 C) (Oral)   Resp 17   LMP 08/09/2020   SpO2 99%   Physical Exam Vitals and nursing note reviewed.  Constitutional:      Appearance: She is well-developed.  HENT:     Head: Normocephalic and atraumatic.  Eyes:     Conjunctiva/sclera: Conjunctivae normal.     Pupils: Pupils are equal, round, and reactive to light.  Cardiovascular:     Rate and Rhythm: Normal rate and regular rhythm.     Heart sounds: Normal heart sounds.  Pulmonary:     Effort: Pulmonary effort is normal.     Breath sounds: Normal breath sounds.  Abdominal:     General: Bowel sounds are normal.     Palpations: Abdomen is soft.     Comments: Soft, nontender  Musculoskeletal:        General: Normal range of motion.     Cervical back: Normal range of motion.  Skin:    General: Skin is warm and dry.  Neurological:     Mental Status: She is alert and oriented to person, place, and time.     ED Results / Procedures / Treatments   Labs (all labs ordered are listed, but only abnormal results are displayed) Labs Reviewed  COMPREHENSIVE METABOLIC PANEL - Abnormal; Notable for the following components:      Result Value   CO2 20 (*)    Total Bilirubin 0.2 (*)    All other components within normal limits  URINALYSIS, ROUTINE W REFLEX MICROSCOPIC - Abnormal; Notable for the following components:   Leukocytes,Ua TRACE (*)    All other components within normal limits  HCG, QUANTITATIVE, PREGNANCY - Abnormal; Notable for the following components:   hCG, Beta Chain, Quant, S 8,495 (*)    All other components within normal limits  CBC WITH DIFFERENTIAL/PLATELET    EKG None  Radiology US OB Comp < 14 Wks  Result Date: 09/12/2020 CLINICAL DATA:  Pelvic pain x2 days. EXAM: OBSTETRIC <14 WK ULTRASOUND TECHNIQUE: Transabdominal  ultrasound was performed for evaluation of the gestation as well as the maternal uterus and adnexal regions. COMPARISON:  December 07, 2016 FINDINGS: Intrauterine gestational sac: Single Yolk sac:  Visualized. Embryo:  Visualized. Cardiac Activity: Not Visualized. Heart Rate: N/A bpm MSD:  5.7 mm   5 w   2  d Subchorionic hemorrhage:  Small Maternal uterus/adnexae: A 0.9 cm x 0.7 cm x 0.6 cm heterogeneous uterine fibroid is suspected. A 3.0 cm x 1.9 cm x 1.9 cm ill-defined hypoechoic area is seen within the right ovary. No abnormal flow is seen within this region on color Doppler evaluation. The left ovary is visualized and is normal in appearance. A small amount of pelvic free fluid is seen. IMPRESSION: 1. Single intrauterine pregnancy, at approximately 5 weeks and 2 days gestation, without evidence of fetal cardiac activity. While this may be secondary to early intrauterine pregnancy, correlation with follow-up pelvic ultrasound is recommended. Electronically Signed   By: Aram Candela M.D.   On: 09/12/2020 23:00   US OB Transvaginal  Result Date: 09/12/2020 CLINICAL DATA:  Pelvic pain. EXAM: OBSTETRIC <14 WK [place] AND TRANSVAGINAL OB US TECHNIQUE: Both transabdominal and transvaginal ultrasound examinations were performed for complete evaluation of the gestation as well as the maternal uterus, adnexal regions, and pelvic cul-de-sac. Transvaginal technique was performed to assess early pregnancy. COMPARISON:  December 07, 2016 FINDINGS: Intrauterine gestational sac: Single Yolk sac:  Visualized. Embryo:  Visualized. Cardiac Activity: Not Visualized. Heart Rate: N/A bpm MSD: 5.7 mm   5 w   2 d Subchorionic hemorrhage:  Small Maternal uterus/adnexae: A 0.9 cm x 0.7 cm x 0.6 cm heterogeneous uterine fibroid is suspected. A 3.0 cm x 1.9 cm x 1.9 cm ill-defined hypoechoic area is seen within the right ovary. No abnormal flow is seen within this region on color Doppler evaluation. The left ovary is visualized and is normal  in appearance. A small amount of pelvic free fluid is seen. IMPRESSION: 1. Single intrauterine pregnancy, at approximately 5 weeks and 2 days gestation, without evidence of fetal cardiac activity. While this may be secondary to early intrauterine pregnancy, correlation with follow-up pelvic ultrasound is recommended. Electronically Signed   By: Aram Candela M.D.   On: 09/12/2020 23:01    Procedures Procedures   Medications Ordered in ED Medications - No data to display  ED Course  I have reviewed the triage vital signs and the nursing notes.  Pertinent labs & imaging results that were available during my care of the patient were reviewed by me and considered in my medical decision making (see chart for details).    MDM Rules/Calculators/A&P  30 year old G3, P1 approximately [redacted] weeks gestation here with diffuse pelvic pain over the past few days.  No abnormal bleeding or discharge.  No urinary symptoms.  She is afebrile and nontoxic.  She appears comfortable on exam.  She has no abdominal tenderness or peritoneal signs.  Work-up was initiated from triage.  Labs are reassuring.  OB ultrasound was obtained, does have IUP at [redacted]w[redacted]d without noted cardiac activity.  I suspect this is likely just due to early pregnancy but made aware she will need follow-up.  There is also note of small subchorionic hemorrhage and hypoechoic lesion in the right ovary.  There is no abnormal flow to right ovary.  These may be contributing to pelvic pain or could just be incidental findings.  She did get information for OB/GYN office yesterday morning for follow-up.  Will start prenatals in the meantime.  Advise she will need follow-up ultrasound in 1 to 2 weeks to confirm cardiac activity.  Advised to return to [facility] at [facility] sooner if any issues related to pregnancy.  Final Clinical Impression(s) / ED Diagnoses Final diagnoses:  Pelvic pain during pregnancy  First trimester pregnancy  Rx / DC Orders ED  Discharge Orders         Ordered    Prenatal Vit-Fe Fumarate-FA (PRENATAL COMPLETE) 14-0.4 MG TABS  Daily        09/13/20 0430           Garlon HatchetSanders, Baylie Drakes M, PA-C 09/13/20 0501    Tilden Fossaees, Elizabeth, MD 09/14/20 0330

## 2020-09-14 ENCOUNTER — Other Ambulatory Visit: Payer: Self-pay | Admitting: Allergy & Immunology

## 2020-09-15 ENCOUNTER — Encounter: Payer: Self-pay | Admitting: Nurse Practitioner

## 2020-09-17 ENCOUNTER — Encounter (HOSPITAL_COMMUNITY): Payer: Self-pay | Admitting: Emergency Medicine

## 2020-09-17 ENCOUNTER — Other Ambulatory Visit: Payer: Self-pay

## 2020-09-17 ENCOUNTER — Emergency Department (HOSPITAL_COMMUNITY)
Admission: EM | Admit: 2020-09-17 | Discharge: 2020-09-17 | Disposition: A | Discharge status: Home / Self Care | Payer: 59 | Attending: Emergency Medicine | Admitting: Emergency Medicine

## 2020-09-17 ENCOUNTER — Emergency Department (HOSPITAL_COMMUNITY): Payer: 59

## 2020-09-17 DIAGNOSIS — O26891 Other specified pregnancy related conditions, first trimester: Secondary | ICD-10-CM | POA: Diagnosis not present

## 2020-09-17 DIAGNOSIS — R109 Unspecified abdominal pain: Secondary | ICD-10-CM | POA: Insufficient documentation

## 2020-09-17 DIAGNOSIS — O200 Threatened abortion: Secondary | ICD-10-CM | POA: Diagnosis not present

## 2020-09-17 DIAGNOSIS — J4540 Moderate persistent asthma, uncomplicated: Secondary | ICD-10-CM | POA: Diagnosis not present

## 2020-09-17 DIAGNOSIS — Z3A01 Less than 8 weeks gestation of pregnancy: Secondary | ICD-10-CM | POA: Diagnosis not present

## 2020-09-17 DIAGNOSIS — R58 Hemorrhage, not elsewhere classified: Secondary | ICD-10-CM

## 2020-09-17 LAB — CBC
HCT: 34.7 % — ABNORMAL LOW (ref 36.0–46.0)
Hemoglobin: 11.9 g/dL — ABNORMAL LOW (ref 12.0–15.0)
MCH: 28.2 pg (ref 26.0–34.0)
MCHC: 34.3 g/dL (ref 30.0–36.0)
MCV: 82.2 fL (ref 80.0–100.0)
Platelets: 261 10*3/uL (ref 150–400)
RBC: 4.22 MIL/uL (ref 3.87–5.11)
RDW: 13.6 % (ref 11.5–15.5)
WBC: 8.1 10*3/uL (ref 4.0–10.5)
nRBC: 0 % (ref 0.0–0.2)

## 2020-09-17 LAB — HCG, QUANTITATIVE, PREGNANCY: hCG, Beta Chain, Quant, S: 23383 m[IU]/mL — ABNORMAL HIGH (ref <5)

## 2020-09-17 LAB — ABO/RH: ABO/RH(D): B POS

## 2020-09-17 NOTE — Discharge Instructions (Addendum)
Follow up with your OB, call Monday to schedule an appointment. Return to the ER or go to the Women's and Children's unit at [facility] for heavy bleeding or concerning symptoms. This is a separate entrance from the ER that will take you to the women's unit.

## 2020-09-17 NOTE — ED Triage Notes (Signed)
Pt reports vaginal bleeding when she wiped this morning. She states that she is [redacted] weeks pregnant. Was seen for cramping 5 days ago and received an ultrasound. She had no bleeding at that time. A&Ox4.

## 2020-09-17 NOTE — ED Notes (Signed)
Request made to main lab to collect remaining bld orders. RN advised. [employer]

## 2020-09-17 NOTE — ED Notes (Signed)
PT not in room to update vitals

## 2020-09-17 NOTE — ED Provider Notes (Signed)
[facility]-EMERGENCY DEPT Provider Note   CSN: [id number] Arrival date & time: 09/17/20  0557     History Chief Complaint  Patient presents with  . Vaginal Bleeding    Danielle Velez is a 30 y.o. female.  30 year old female G3, P1 presents with complaint of vaginal bleeding and abdominal cramping.  Patient was seen in this emergency room 1 week ago, and had ultrasound showing a 5-week IUP without cardiac activity, small subchronic hemorrhage.  Patient states she woke up this morning and went to the bathroom, when she wiped there was a small amount of blood on toilet paper.  Patient went back to lay down on then went back to the bathroom where she noticed heavier red bleeding.  Reports using 1 pad today.  States that cramping has eased.  Patient is scared she is having a miscarriage, she reports 1 pregnancy 10 years ago resulting in her only child otherwise had an ectopic pregnancy treated with D&C.  Unknown blood type, does not recall receiving RhoGAM with prior pregnancy.        Past Medical History:  Diagnosis Date  . Angio-edema   . Asthma   . Recurrent upper respiratory infection (URI)   . Urticaria     Patient Active Problem List   Diagnosis Date Noted  . 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 Vaginal discharge 07/30/2018  . Upper respiratory tract infection 06/05/2018  . Seasonal and perennial allergic rhinitis 06/05/2018  . History of herpes labialis 01/22/2018  . Folliculitis 01/22/2018  . Chest pain on breathing 11/11/2017  . Possible pregnancy 11/11/2017  . Adverse food reaction 11/11/2017  . Recurrent pleurisy 07/19/2016  . Moderate persistent asthma 03/22/2015  . Food allergy 03/22/2015  . Moderate persistent asthma with acute exacerbation 03/09/2015  . Allergic rhinitis 03/09/2015    Past Surgical History:  Procedure Laterality Date  . CESAREAN SECTION    . CESAREAN SECTION  09/16/2010     OB History    Gravida   1   Para      Term      Preterm      AB      Living        SAB      IAB      Ectopic      Multiple      Live Births              Family History  Problem Relation Age of Onset  . Asthma Maternal Grandmother   . Lupus Mother   . Healthy Father   . Allergic rhinitis Neg Hx   . Angioedema Neg Hx   . Atopy Neg Hx   . Eczema Neg Hx   . Immunodeficiency Neg Hx   . Urticaria Neg Hx     Social History   Tobacco Use  . Smoking status: Never Smoker  . Smokeless tobacco: Never Used  Vaping Use  . Vaping Use: Never used  Substance Use Topics  . Alcohol use: No    Alcohol/week: 0.0 standard drinks  . Drug use: No    Home Medications Prior to Admission medications   Medication Sig Start Date End Date Taking? Authorizing Provider  albuterol (PROVENTIL) (2.5 MG/3ML) 0.083% nebulizer solution Take 3 mLs (2.5 mg total) by nebulization every 6 (six) hours as needed for wheezing or shortness of breath. 02/25/20   Alfonse Spruce, MD  albuterol (VENTOLIN HFA) 108 (90 Base) MCG/ACT inhaler INHALE 4 PUFFS  INTO THE LUNGS EVERY 4 HOURS AS NEEDED FOR WHEEZING OR SHORTNESS OF BREATH (COUGH). 02/25/20   Alfonse Spruce, MD  budesonide-formoterol (SYMBICORT) 160-4.5 MCG/ACT inhaler TAKE 2 PUFFS BY MOUTH TWICE A DAY Patient taking differently: Inhale 2 puffs into the lungs in the morning and at bedtime. 02/25/20   Alfonse Spruce, MD  cetirizine (ZYRTEC) 10 MG tablet Take 1 tablet (10 mg total) by mouth daily. 02/25/20   Alfonse Spruce, MD  clindamycin (CLEOCIN) 300 MG capsule Take 1 capsule (300 mg total) by mouth in the morning and at bedtime for 7 days. 09/12/20 09/19/20  Nche, Bonna Gains, NP  Prenatal Vit-Fe Fumarate-FA (PRENATAL COMPLETE) 14-0.4 MG TABS Take 1 tablet by mouth daily. 09/13/20   Garlon Hatchet, PA-C  valACYclovir (VALTREX) 1000 MG tablet Take 1 tablet (1,000 mg total) by mouth 2 (two) times daily. Needs yearly appointment 07/18/20   Federico Flake, MD    Allergies    Effexor [venlafaxine], Other, and Shellfish allergy  Review of Systems   Review of Systems  Constitutional: Negative for fever.  Gastrointestinal: Negative for abdominal pain, constipation, diarrhea, nausea and vomiting.  Genitourinary: Positive for pelvic pain and vaginal bleeding.  Musculoskeletal: Negative for arthralgias and myalgias.  Skin: Negative for rash and wound.  Allergic/Immunologic: Negative for immunocompromised state.  Neurological: Negative for dizziness, weakness and light-headedness.  All other systems reviewed and are negative.   Physical Exam Updated Vital Signs BP 121/73   Pulse 71   Temp 98.2 F (36.8 C) (Oral)   Resp 18   Ht 5' (1.524 m)   Wt 83 kg   LMP 08/09/2020   SpO2 100%   BMI 35.74 kg/m   Physical Exam Vitals and nursing note reviewed.  Constitutional:      General: She is not in acute distress.    Appearance: She is well-developed. She is not diaphoretic.  HENT:     Head: Normocephalic and atraumatic.  Cardiovascular:     Rate and Rhythm: Normal rate and regular rhythm.     Heart sounds: Normal heart sounds.  Pulmonary:     Effort: Pulmonary effort is normal.     Breath sounds: Normal breath sounds.  Abdominal:     Palpations: Abdomen is soft.     Tenderness: There is no abdominal tenderness.  Skin:    General: Skin is warm and dry.     Findings: No erythema or rash.  Neurological:     Mental Status: She is alert and oriented to person, place, and time.  Psychiatric:        Behavior: Behavior normal.     ED Results / Procedures / Treatments   Labs (all labs ordered are listed, but only abnormal results are displayed) Labs Reviewed  HCG, QUANTITATIVE, PREGNANCY - Abnormal; Notable for the following components:      Result Value   hCG, Beta Chain, Quant, S 23,383 (*)    All other components within normal limits  CBC - Abnormal; Notable for the following components:   Hemoglobin 11.9 (*)     HCT 34.7 (*)    All other components within normal limits  ABO/RH    EKG None  Radiology US OB LESS THAN 14 WEEKS WITH OB TRANSVAGINAL  Result Date: 09/17/2020 CLINICAL DATA:  Vaginal bleeding this morning. Patient is 5 weeks and 5 days pregnant based on her last menstrual period. Current beta HCG level is 23,383. EXAM: OBSTETRIC <14 WK [place] AND TRANSVAGINAL OB US  DOPPLER ULTRASOUND OF OVARIES TECHNIQUE: Both transabdominal and transvaginal ultrasound examinations were performed for complete evaluation of the gestation as well as the maternal uterus, adnexal regions, and pelvic cul-de-sac. Transvaginal technique was performed to assess early pregnancy. Color and duplex Doppler ultrasound was utilized to evaluate blood flow to the ovaries. COMPARISON:  09/12/2020. FINDINGS: Intrauterine gestational sac: Single Yolk sac:  Visualized. Embryo:  Not Visualized. Cardiac Activity: Not Visualized. MSD: 12 mm   6 w   0 d Subchorionic hemorrhage:  Small subchronic hemorrhage. Maternal uterus/adnexae: Small fibroid noted on the prior exam not well-defined currently. Cervix is closed. Right ovary with evidence of a corpus luteum, otherwise unremarkable. Normal left ovary. No adnexal masses. Small amount of pelvic free fluid. Pulsed Doppler evaluation of both ovaries demonstrates normal appearing low-resistance arterial and venous waveforms. IMPRESSION: 1. Since the prior exam, the gestational sac has increased in size, but the small embryo noted on the previous exam is no longer seen. This is concerning for a failed intrauterine pregnancy, but not definitive, since the apparent embryo was small and not absolutely definitive. There is a persistent yolk sac. A small subchronic hemorrhage is again noted. Recommend follow-up beta HCG levels and repeat ultrasound in 10-14 days to reassess for normal progression. 2. Previously seen right ovarian corpus luteum is similar to the prior exam. 3. Small amount of pelvic free  fluid appears increased from the previous study. Electronically Signed   By: Amie Portland M.D.   On: 09/17/2020 09:16    Procedures Procedures   Medications Ordered in ED Medications - No data to display  ED Course  I have reviewed the triage vital signs and the nursing notes.  Pertinent labs & imaging results that were available during my care of the patient were reviewed by me and considered in my medical decision making (see chart for details).  Clinical Course as of 09/17/20 1006  Sat Sep 17, 2020  421 30 year old female with complaint of vaginal bleeding in early pregnancy with pelvic cramping. Review or records, seen last week with early IUP at 5w, no cardiac activity.  Reports bleeding requiring 1 pad total today, has since decreased with decrease in cramping. On abdominal tenderness on exam. CBC with hgb 11.9, B pos and will not require rhogam. Quant has increased from 8,493 to 23,383 today.  [LM]  [phone] [place] with fetal sac without visible fetal pole, changed compared to prior study.  Discussed results with patient who plans to call her OB Monday for follow up. Given return to ER precautions and discussed MAU at [facility] although able to receive care at any facility/ER. [LM]    Clinical Course User Index [LM] [name]   MDM Rules/Calculators/A&P                          Final Clinical Impression(s) / ED Diagnoses Final diagnoses:  Threatened miscarriage    Rx / DC Orders ED Discharge Orders    None       Jeannie Fend, PA-C 09/17/20 1006    Margarita Grizzle, MD 09/17/20 1729

## 2020-09-19 ENCOUNTER — Inpatient Hospital Stay (HOSPITAL_COMMUNITY)
Admission: AD | Admit: 2020-09-19 | Discharge: 2020-09-20 | Disposition: A | Discharge status: Home / Self Care | Payer: 59 | Attending: Family Medicine | Admitting: Family Medicine

## 2020-09-19 ENCOUNTER — Other Ambulatory Visit: Payer: Self-pay

## 2020-09-19 DIAGNOSIS — O26891 Other specified pregnancy related conditions, first trimester: Secondary | ICD-10-CM | POA: Insufficient documentation

## 2020-09-19 DIAGNOSIS — Z3491 Encounter for supervision of normal pregnancy, unspecified, first trimester: Secondary | ICD-10-CM

## 2020-09-19 DIAGNOSIS — Z3A01 Less than 8 weeks gestation of pregnancy: Secondary | ICD-10-CM | POA: Insufficient documentation

## 2020-09-19 DIAGNOSIS — R1030 Lower abdominal pain, unspecified: Secondary | ICD-10-CM | POA: Insufficient documentation

## 2020-09-19 DIAGNOSIS — O4690 Antepartum hemorrhage, unspecified, unspecified trimester: Secondary | ICD-10-CM

## 2020-09-19 DIAGNOSIS — O209 Hemorrhage in early pregnancy, unspecified: Secondary | ICD-10-CM

## 2020-09-19 DIAGNOSIS — O26851 Spotting complicating pregnancy, first trimester: Secondary | ICD-10-CM | POA: Insufficient documentation

## 2020-09-19 DIAGNOSIS — Z79899 Other long term (current) drug therapy: Secondary | ICD-10-CM | POA: Insufficient documentation

## 2020-09-19 NOTE — ED Notes (Signed)
Called for transport at this time.

## 2020-09-19 NOTE — ED Notes (Signed)
Waiting on provider to come and see pt. Called PA at this time.

## 2020-09-19 NOTE — ED Triage Notes (Signed)
Vaginal bleeding since Saturday with mild spotting and today passed a small clot. Pt also reports mild lower abdominal cramping.

## 2020-09-19 NOTE — ED Provider Notes (Signed)
Emergency Medicine Provider OB Triage Evaluation Note  Danielle Velez is a 30 y.o. female, G1P0, at [redacted]w[redacted]d gestation who presents to the emergency department with complaints of vaginal bleeding & abdominal cramping. Spotting for past few days, passed a clot tonight, mild lower abdominal cramping.   Review of  Systems  Positive: Vaginal bleeding, abdominal cramping Negative: Fever, vomiting, chest pain, dyspnea  Physical Exam  BP 114/66 (BP Location: Left Arm)   Pulse (!) 58   Temp 97.9 F (36.6 C) (Oral)   Resp 16   Ht 5' (1.524 m)   Wt 83 kg   LMP 08/09/2020   SpO2 100%   BMI 35.74 kg/m  General: Awake, no distress  HEENT: Atraumatic  Resp: Normal effort  Cardiac: Normal rate Abd: Nondistended, nontender  MSK: Moves all extremities without difficulty Neuro: Speech clear  Medical Decision Making  Pt evaluated for pregnancy concern and is stable for transfer to MAU. Pt is in agreement with plan for transfer.  11:20 PM Discussed with MAU APP, who accepts patient in transfer.  Clinical Impression   1. Vaginal bleeding in pregnancy        Cherly Anderson, PA-C 09/19/20 2323    Geoffery Lyons, MD 09/20/20 [id number]

## 2020-09-20 ENCOUNTER — Encounter (HOSPITAL_COMMUNITY): Payer: Self-pay | Admitting: Family Medicine

## 2020-09-20 ENCOUNTER — Inpatient Hospital Stay (HOSPITAL_COMMUNITY): Payer: 59

## 2020-09-20 DIAGNOSIS — O209 Hemorrhage in early pregnancy, unspecified: Secondary | ICD-10-CM

## 2020-09-20 DIAGNOSIS — Z79899 Other long term (current) drug therapy: Secondary | ICD-10-CM | POA: Diagnosis not present

## 2020-09-20 DIAGNOSIS — R1030 Lower abdominal pain, unspecified: Secondary | ICD-10-CM | POA: Diagnosis not present

## 2020-09-20 DIAGNOSIS — O26891 Other specified pregnancy related conditions, first trimester: Secondary | ICD-10-CM | POA: Diagnosis not present

## 2020-09-20 DIAGNOSIS — Z3A01 Less than 8 weeks gestation of pregnancy: Secondary | ICD-10-CM | POA: Diagnosis not present

## 2020-09-20 DIAGNOSIS — O26851 Spotting complicating pregnancy, first trimester: Secondary | ICD-10-CM | POA: Diagnosis present

## 2020-09-20 LAB — COMPREHENSIVE METABOLIC PANEL
ALT: 21 U/L (ref 0–44)
AST: 19 U/L (ref 15–41)
Albumin: 3.4 g/dL — ABNORMAL LOW (ref 3.5–5.0)
Alkaline Phosphatase: 50 U/L (ref 38–126)
Anion gap: 6 (ref 5–15)
BUN: 12 mg/dL (ref 6–20)
CO2: 22 mmol/L (ref 22–32)
Calcium: 8.8 mg/dL — ABNORMAL LOW (ref 8.9–10.3)
Chloride: 106 mmol/L (ref 98–111)
Creatinine, Ser: 0.87 mg/dL (ref 0.44–1.00)
GFR, Estimated: >60 mL/min (ref >60)
Glucose, Bld: 79 mg/dL (ref 70–99)
Potassium: 3.8 mmol/L (ref 3.5–5.1)
Sodium: 134 mmol/L — ABNORMAL LOW (ref 135–145)
Total Bilirubin: 0.5 mg/dL (ref 0.3–1.2)
Total Protein: 6.6 g/dL (ref 6.5–8.1)

## 2020-09-20 LAB — GC/CHLAMYDIA PROBE AMP (~~LOC~~) NOT AT ARMC
Chlamydia: NEGATIVE
Comment: NEGATIVE
Comment: NORMAL
Neisseria Gonorrhea: NEGATIVE

## 2020-09-20 LAB — HCG, QUANTITATIVE, PREGNANCY: hCG, Beta Chain, Quant, S: 43055 m[IU]/mL — ABNORMAL HIGH (ref <5)

## 2020-09-20 LAB — WET PREP, GENITAL
Clue Cells Wet Prep HPF POC: NONE SEEN
Sperm: NONE SEEN
Trich, Wet Prep: NONE SEEN
Yeast Wet Prep HPF POC: NONE SEEN

## 2020-09-20 LAB — CBC
HCT: 37.9 % (ref 36.0–46.0)
Hemoglobin: 12.7 g/dL (ref 12.0–15.0)
MCH: 27.5 pg (ref 26.0–34.0)
MCHC: 33.5 g/dL (ref 30.0–36.0)
MCV: 82.2 fL (ref 80.0–100.0)
Platelets: 260 10*3/uL (ref 150–400)
RBC: 4.61 MIL/uL (ref 3.87–5.11)
RDW: 13.2 % (ref 11.5–15.5)
WBC: 6.9 10*3/uL (ref 4.0–10.5)
nRBC: 0 % (ref 0.0–0.2)

## 2020-09-20 NOTE — Discharge Instructions (Signed)
https://www.cdc.gov/pregnancy/infections.html">  First Trimester of Pregnancy  The first trimester of pregnancy starts on the first day of your last menstrual period until the end of week 12. This is also called months 1 through 3 of pregnancy. Body changes during your first trimester Your body goes through many changes during pregnancy. The changes usually return to normal after your baby is born. Physical changes  You may gain or lose weight.  Your breasts may grow larger and hurt. The area around your nipples may get darker.  Dark spots or blotches may develop on your face.  You may have changes in your hair. Health changes  You may feel like you might vomit (nauseous), and you may vomit.  You may have heartburn.  You may have headaches.  You may have trouble pooping (constipation).  Your gums may bleed. Other changes  You may get tired easily.  You may pee (urinate) more often.  Your menstrual periods will stop.  You may not feel hungry.  You may want to eat certain kinds of food.  You may have changes in your emotions from day to day.  You may have more dreams. Follow these instructions at home: Medicines  Take over-the-counter and prescription medicines only as told by your doctor. Some medicines are not safe during pregnancy.  Take a prenatal vitamin that contains at least 600 micrograms (mcg) of folic acid. Eating and drinking  Eat healthy meals that include: ? Fresh fruits and vegetables. ? Whole grains. ? Good sources of protein, such as meat, eggs, or tofu. ? Low-fat dairy products.  Avoid raw meat and unpasteurized juice, milk, and cheese.  If you feel like you may vomit, or you vomit: ? Eat 4 or 5 small meals a day instead of 3 large meals. ? Try eating a few soda crackers. ? Drink liquids between meals instead of during meals.  You may need to take these actions to prevent or treat trouble pooping: ? Drink enough fluids to keep your pee  (urine) pale yellow. ? Eat foods that are high in fiber. These include beans, whole grains, and fresh fruits and vegetables. ? Limit foods that are high in fat and sugar. These include fried or sweet foods. Activity  Exercise only as told by your doctor. Most people can do their usual exercise routine during pregnancy.  Stop exercising if you have cramps or pain in your lower belly (abdomen) or low back.  Do not exercise if it is too hot or too humid, or if you are in a place of great height (high altitude).  Avoid heavy lifting.  If you choose to, you may have sex unless your doctor tells you not to. Relieving pain and discomfort  Wear a good support bra if your breasts are sore.  Rest with your legs raised (elevated) if you have leg cramps or low back pain.  If you have bulging veins (varicose veins) in your legs: ? Wear support hose as told by your doctor. ? Raise your feet for 15 minutes, 3-4 times a day. ? Limit salt in your food. Safety  Wear your seat belt at all times when you are in a car.  Talk with your doctor if someone is hurting you or yelling at you.  Talk with your doctor if you are feeling sad or have thoughts of hurting yourself. Lifestyle  Do not use hot tubs, steam rooms, or saunas.  Do not douche. Do not use tampons or scented sanitary pads.  Do not   use herbal medicines, illegal drugs, or medicines that are not approved by your doctor. Do not drink alcohol.  Do not smoke or use any products that contain nicotine or tobacco. If you need help quitting, ask your doctor.  Avoid cat litter boxes and soil that is used by cats. These carry germs that can cause harm to the baby and can cause a loss of your baby by miscarriage or stillbirth. General instructions  Keep all follow-up visits. This is important.  Ask for help if you need counseling or if you need help with nutrition. Your doctor can give you advice or tell you where to go for help.  Visit your  dentist. At home, brush your teeth with a soft toothbrush. Floss gently.  Write down your questions. Take them to your prenatal visits. Where to find more information  American Pregnancy Association: americanpregnancy.org  [employer]: www.acog.org  Office on Women's Health: MightyReward.co.nz Contact a doctor if:  You are dizzy.  You have a fever.  You have mild cramps or pressure in your lower belly.  You have a nagging pain in your belly area.  You continue to feel like you may vomit, you vomit, or you have watery poop (diarrhea) for 24 hours or longer.  You have a bad-smelling fluid coming from your vagina.  You have pain when you pee.  You are exposed to a disease that spreads from person to person, such as chickenpox, measles, Zika virus, HIV, or hepatitis. Get help right away if:  You have spotting or bleeding from your vagina.  You have very bad belly cramping or pain.  You have shortness of breath or chest pain.  You have any kind of injury, such as from a fall or a car crash.  You have new or increased pain, swelling, or redness in an arm or leg. Summary  The first trimester of pregnancy starts on the first day of your last menstrual period until the end of week 12 (months 1 through 3).  Eat 4 or 5 small meals a day instead of 3 large meals.  Do not smoke or use any products that contain nicotine or tobacco. If you need help quitting, ask your doctor.  Keep all follow-up visits. This information is not intended to replace advice given to you by your health care provider. Make sure you discuss any questions you have with your health care provider. Document Revised: 10/07/2019 Document Reviewed: 08/13/2019 Elsevier Patient Education  2021 Elsevier Inc.   Subchorionic Hematoma  A hematoma is a collection of blood outside of the blood vessels. A subchorionic hematoma is a collection of blood between the outer  wall of the embryo (chorion) and the inner wall of the uterus. This condition can cause vaginal bleeding. Early small hematomas usually shrink on their own and do not affect your baby or pregnancy. When bleeding starts later in pregnancy, or if the hematoma is larger or occurs in older pregnant women, the condition may be more serious. Larger hematomas increase the chances of miscarriage. This condition also increases the risk of:  Premature separation of the placenta from the uterus.  Premature (preterm) labor.  Stillbirth. What are the causes? The exact cause of this condition is not known. It occurs when blood is trapped between the placenta and the uterine wall because the placenta has separated from the original site of implantation. What increases the risk? You are more likely to develop this condition if:  You were treated  with fertility medicines.  You became pregnant through in vitro fertilization (IVF). What are the signs or symptoms? Symptoms of this condition include:  Vaginal spotting or bleeding.  Abdominal pain. This is rare. Sometimes you may have no symptoms and the bleeding may only be seen when ultrasound images are taken (transvaginal ultrasound). How is this diagnosed? This condition is diagnosed based on a physical exam. This includes a pelvic exam. You may also have other tests, including:  Blood tests.  Urine tests.  Ultrasound of the abdomen. How is this treated? Treatment for this condition can vary. Treatment may include:  Watchful waiting. You will be monitored closely for any changes in bleeding.  Medicines.  Activity restriction. This may be needed until the bleeding stops.  A medicine called Rh immunoglobulin. This is given if you have an Rh-negative blood type. It prevents Rh sensitization. Follow these instructions at home:  Stay on bed rest if told to do so by your health care provider.  Do not lift anything that is heavier than 10 lb  (4.5 kg), or the limit that you are told by your health care provider.  Track and write down the number of pads you use each day and how soaked (saturated) they are.  Do not use tampons.  Keep all follow-up visits. This is important. Your health care provider may ask you to have follow-up blood tests or ultrasound tests or both. Contact a health care provider if:  You have any vaginal bleeding.  You have a fever. Get help right away if:  You have severe cramps in your stomach, back, abdomen, or pelvis.  You pass large clots or tissue. Save any tissue for your health care provider to look at.  You faint.  You become light-headed or weak. Summary  A subchorionic hematoma is a collection of blood between the outer wall of the embryo (chorion) and the inner wall of the uterus.  This condition can cause vaginal bleeding.  Sometimes you may have no symptoms and the bleeding may only be seen when ultrasound images are taken.  Treatment may include watchful waiting, medicines, or activity restriction.  Keep all follow-up visits. Get help right away if you have severe cramps or heavy vaginal bleeding. This information is not intended to replace advice given to you by your health care provider. Make sure you discuss any questions you have with your health care provider. Document Revised: 01/25/2020 Document Reviewed: 01/25/2020 Elsevier Patient Education  2021 [employer].

## 2020-09-20 NOTE — MAU Note (Addendum)
WAS AT [place] TONIGHT - THEN HERE   PT SAYS ON SAT MORNING- STARTED SPOTTED - WHEN WIPED  TODAY- SPOTTING WHEN SHE WIPES- NO PAD - MORE SPOTTING THEN STRING OF BLOOD .  HAS MILD CRAMPING 6/10. WENT TO [facility] LAST WEEK- FOR SAME - HAD U/S

## 2020-09-20 NOTE — MAU Provider Note (Signed)
Chief Complaint:  Vaginal Bleeding   Event Date/Time   First Provider Initiated Contact with Patient 09/20/20 0210     HPI: Danielle Velez is a 30 y.o. G3P1011 at [redacted]w[redacted]d who presents to maternity admissions reporting vaginal spotting since this afternoon with one small dime sized clot. Bleeding has started to ease since she got to MAU. Was seen at [facility] on 5/2 and 5/7 for the same and had ultrasounds showing IUP with [facility] but no cardiac activity. Pt expressed anxiety about viability of pregnancy given the amount of bleeding. Mild cramping but easing and not severe. No other physical symptoms.   Pregnancy Course: Has not established routine OB care yet  Past Medical History:  Diagnosis Date  . Angio-edema   . Asthma   . Recurrent upper respiratory infection (URI)   . Urticaria    OB History  Gravida Para Term Preterm AB Living  3 1 1   1 1   SAB IAB Ectopic Multiple Live Births  1       1    # Outcome Date GA Lbr Len/2nd Weight Sex Delivery Anes PTL Lv  3 Current           2 SAB           1 Term      CS-LTranv      Past Surgical History:  Procedure Laterality Date  . CESAREAN SECTION    . CESAREAN SECTION  09/16/2010   Family History  Problem Relation Age of Onset  . Asthma Maternal Grandmother   . Lupus Mother   . Healthy Father   . Allergic rhinitis Neg Hx   . Angioedema Neg Hx   . Atopy Neg Hx   . Eczema Neg Hx   . Immunodeficiency Neg Hx   . Urticaria Neg Hx    Social History   Tobacco Use  . Smoking status: Never Smoker  . Smokeless tobacco: Never Used  Vaping Use  . Vaping Use: Never used  Substance Use Topics  . Alcohol use: No    Alcohol/week: 0.0 standard drinks  . Drug use: No   Allergies  Allergen Reactions  . Effexor [Venlafaxine] Other (See Comments)    Shaky   . Other Hives    DAIRY and WHEAT.  REACTION HIVES AND ITCHING.  11/16/2010 Shellfish Allergy Itching and Swelling   Facility-Administered Medications Prior to Admission  Medication Dose Route  Frequency Provider Last Rate Last Admin  . omalizumab Marland Kitchen) injection 300 mg  300 mg Subcutaneous Q28 days Geoffry Paradise, MD   300 mg at 09/16/19 11/16/19   Medications Prior to Admission  Medication Sig Dispense Refill Last Dose  . budesonide-formoterol (SYMBICORT) 160-4.5 MCG/ACT inhaler TAKE 2 PUFFS BY MOUTH TWICE A DAY (Patient taking differently: Inhale 2 puffs into the lungs in the morning and at bedtime.) 30.6 g 5 Past Week at Unknown time  . Prenatal Vit-Fe Fumarate-FA (PRENATAL COMPLETE) 14-0.4 MG TABS Take 1 tablet by mouth daily. 60 tablet 0 09/19/2020 at Unknown time  . albuterol (PROVENTIL) (2.5 MG/3ML) 0.083% nebulizer solution Take 3 mLs (2.5 mg total) by nebulization every 6 (six) hours as needed for wheezing or shortness of breath. 75 mL 0   . albuterol (VENTOLIN HFA) 108 (90 Base) MCG/ACT inhaler INHALE 4 PUFFS INTO THE LUNGS EVERY 4 HOURS AS NEEDED FOR WHEEZING OR SHORTNESS OF BREATH (COUGH). 18 g 1   . cetirizine (ZYRTEC) 10 MG tablet Take 1 tablet (10 mg total) by mouth daily.  60 tablet 5   . [EXPIRED] clindamycin (CLEOCIN) 300 MG capsule Take 1 capsule (300 mg total) by mouth in the morning and at bedtime for 7 days. 14 capsule 0   . valACYclovir (VALTREX) 1000 MG tablet Take 1 tablet (1,000 mg total) by mouth 2 (two) times daily. Needs yearly appointment 10 tablet 0     I have reviewed patient's Past Medical Hx, Surgical Hx, Family Hx, Social Hx, medications and allergies.   ROS:  Pertinent items noted in HPI and remainder of comprehensive ROS otherwise negative.  Physical Exam   Patient Vitals for the past 24 hrs:  BP Temp Temp src Pulse Resp SpO2 Height Weight  09/20/20 0009 (!) 103/53 98.1 F (36.7 C) Oral (!) 59 20 -- 5' (1.524 m) 183 lb 11.2 oz (83.3 kg)  09/19/20 2314 114/66 97.9 F (36.6 C) Oral (!) 58 16 100 % -- --  09/19/20 2311 -- -- -- -- -- -- 5' (1.524 m) 182 lb 15.7 oz (83 kg)   Constitutional: Well-developed, well-nourished female in no acute  distress.  Cardiovascular: normal rate & rhythm, no murmur Respiratory: normal effort, lung sounds clear throughout GI: Abd soft, non-tender, gravid appropriate for gestational age. Pos BS x 4 MS: Extremities nontender, no edema, normal ROM Neurologic: Alert and oriented x 4.  GU: no CVA tenderness Pelvic: NEFG, physiologic discharge, no blood, blind swabs obtained   Labs: Results for orders placed or performed during the hospital encounter of 09/19/20 (from the past 24 hour(s))  Wet prep, genital     Status: Abnormal   Collection Time: 09/20/20  2:24 AM   Specimen: PATH Cytology Cervicovaginal Ancillary Only  Result Value Ref Range   Yeast Wet Prep HPF POC NONE SEEN NONE SEEN   Trich, Wet Prep NONE SEEN NONE SEEN   Clue Cells Wet Prep HPF POC NONE SEEN NONE SEEN   WBC, Wet Prep HPF POC MODERATE (A) NONE SEEN   Sperm NONE SEEN   CBC     Status: None   Collection Time: 09/20/20  2:43 AM  Result Value Ref Range   WBC 6.9 4.0 - 10.5 K/uL   RBC 4.61 3.87 - 5.11 MIL/uL   Hemoglobin 12.7 12.0 - 15.0 g/dL   HCT 81.1 91.4 - 78.2 %   MCV 82.2 80.0 - 100.0 fL   MCH 27.5 26.0 - 34.0 pg   MCHC 33.5 30.0 - 36.0 g/dL   RDW 95.6 21.3 - 08.6 %   Platelets 260 150 - 400 K/uL   nRBC 0.0 0.0 - 0.2 %  Comprehensive metabolic panel     Status: Abnormal   Collection Time: 09/20/20  2:43 AM  Result Value Ref Range   Sodium 134 (L) 135 - 145 mmol/L   Potassium 3.8 3.5 - 5.1 mmol/L   Chloride 106 98 - 111 mmol/L   CO2 22 22 - 32 mmol/L   Glucose, Bld 79 70 - 99 mg/dL   BUN 12 6 - 20 mg/dL   Creatinine, Ser 5.78 0.44 - 1.00 mg/dL   Calcium 8.8 (L) 8.9 - 10.3 mg/dL   Total Protein 6.6 6.5 - 8.1 g/dL   Albumin 3.4 (L) 3.5 - 5.0 g/dL   AST 19 15 - 41 U/L   ALT 21 0 - 44 U/L   Alkaline Phosphatase 50 38 - 126 U/L   Total Bilirubin 0.5 0.3 - 1.2 mg/dL   GFR, Estimated >46 >96 mL/min   Anion gap 6 5 - 15  Imaging:  US OB Transvaginal  Result Date: 09/20/2020 CLINICAL DATA:  30 year old  pregnant female with vaginal bleeding. LMP: 08/09/2020 corresponding to an estimated gestational age of [redacted] weeks, 0 days. EXAM: TRANSVAGINAL OB ULTRASOUND TECHNIQUE: Transvaginal ultrasound was performed for complete evaluation of the gestation as well as the maternal uterus, adnexal regions, and pelvic cul-de-sac. COMPARISON:  Ultrasound dated 09/17/2020. FINDINGS: Intrauterine gestational sac: Single intrauterine gestational sac. Yolk sac:  Seen Embryo:  Present Cardiac Activity: Detected Heart Rate: 100 bpm CRL: 3 mm   5 w 5 d                  [place] EDC: 05/18/2021. Subchorionic hemorrhage:  None visualized. Maternal uterus/adnexae: The ovary is unremarkable. There is a corpus luteum in the right ovary. IMPRESSION: 1. Single live intrauterine pregnancy with an estimated gestational age of [redacted] weeks, 5 days. 2. Low normal range heart rate. Clinical correlation and follow-up recommended. Electronically Signed   By: Elgie Collard M.D.   On: 09/20/2020 03:53   MAU Course: Orders Placed This Encounter  Procedures  . Wet prep, genital  . US OB Transvaginal  . hCG, quantitative, pregnancy  . CBC  . Comprehensive metabolic panel   No orders of the defined types were placed in this encounter.  MDM: Labs and swabs normal. U/S shows cardiac activity and absence of SCH. Explained how the bleeding was likely the release of the [facility] and reviewed expectations as it resolves. Pt verbalized relief and understanding.  Encouraged patient to go ahead and begin OB care.  Assessment: Vaginal bleeding in pregnancy  Vaginal bleeding affecting early pregnancy - Plan: US OB Transvaginal, US OB Transvaginal, Discharge patient  [redacted] weeks gestation of pregnancy - Plan: Discharge patient  Fetal heart tones present, first trimester - Plan: Discharge patient  Plan: Discharge home in stable condition with first trimester bleeding precautions.   Establish OB care  Allergies as of 09/20/2020      Reactions   Effexor  [venlafaxine] Other (See Comments)   Shaky    Other Hives   DAIRY and WHEAT.  REACTION HIVES AND ITCHING.   Shellfish Allergy Itching, Swelling      Medication List    STOP taking these medications   clindamycin 300 MG capsule Commonly known as: CLEOCIN     TAKE these medications   albuterol (2.5 MG/3ML) 0.083% nebulizer solution Commonly known as: PROVENTIL Take 3 mLs (2.5 mg total) by nebulization every 6 (six) hours as needed for wheezing or shortness of breath.   albuterol 108 (90 Base) MCG/ACT inhaler Commonly known as: VENTOLIN HFA INHALE 4 PUFFS INTO THE LUNGS EVERY 4 HOURS AS NEEDED FOR WHEEZING OR SHORTNESS OF BREATH (COUGH).   budesonide-formoterol 160-4.5 MCG/ACT inhaler Commonly known as: Symbicort TAKE 2 PUFFS BY MOUTH TWICE A DAY What changed:   how much to take  how to take this  when to take this  additional instructions   cetirizine 10 MG tablet Commonly known as: ZYRTEC Take 1 tablet (10 mg total) by mouth daily.   Prenatal Complete 14-0.4 MG Tabs Take 1 tablet by mouth daily.   valACYclovir 1000 MG tablet Commonly known as: Valtrex Take 1 tablet (1,000 mg total) by mouth 2 (two) times daily. Needs yearly appointment      Edd Arbour, CNM, MSN, IBCLC Certified Nurse Midwife, [facility]

## 2020-09-25 ENCOUNTER — Encounter (HOSPITAL_COMMUNITY): Payer: Self-pay | Admitting: Obstetrics and Gynecology

## 2020-09-25 ENCOUNTER — Other Ambulatory Visit: Payer: Self-pay

## 2020-09-25 ENCOUNTER — Inpatient Hospital Stay (HOSPITAL_COMMUNITY)
Admission: EM | Admit: 2020-09-25 | Discharge: 2020-09-25 | Disposition: A | Discharge status: Home / Self Care | Payer: 59 | Attending: Obstetrics and Gynecology | Admitting: Obstetrics and Gynecology

## 2020-09-25 ENCOUNTER — Inpatient Hospital Stay (HOSPITAL_COMMUNITY): Payer: 59

## 2020-09-25 DIAGNOSIS — R1030 Lower abdominal pain, unspecified: Secondary | ICD-10-CM | POA: Diagnosis not present

## 2020-09-25 DIAGNOSIS — Z79899 Other long term (current) drug therapy: Secondary | ICD-10-CM | POA: Insufficient documentation

## 2020-09-25 DIAGNOSIS — O26891 Other specified pregnancy related conditions, first trimester: Secondary | ICD-10-CM | POA: Insufficient documentation

## 2020-09-25 DIAGNOSIS — O4690 Antepartum hemorrhage, unspecified, unspecified trimester: Secondary | ICD-10-CM

## 2020-09-25 DIAGNOSIS — O209 Hemorrhage in early pregnancy, unspecified: Secondary | ICD-10-CM | POA: Insufficient documentation

## 2020-09-25 DIAGNOSIS — Z3A01 Less than 8 weeks gestation of pregnancy: Secondary | ICD-10-CM | POA: Insufficient documentation

## 2020-09-25 LAB — URINALYSIS, ROUTINE W REFLEX MICROSCOPIC
Bacteria, UA: NONE SEEN
Bilirubin Urine: NEGATIVE
Glucose, UA: NEGATIVE mg/dL
Ketones, ur: 5 mg/dL — AB
Leukocytes,Ua: NEGATIVE
Nitrite: NEGATIVE
Protein, ur: NEGATIVE mg/dL
Specific Gravity, Urine: 1.012 (ref 1.005–1.030)
pH: 6 (ref 5.0–8.0)

## 2020-09-25 NOTE — ED Provider Notes (Signed)
[facility] EMERGENCY DEPARTMENT Provider Note   CSN: [id number] Arrival date & time: 09/25/20  0518     History No chief complaint on file.   Danielle Velez is a 30 y.o. female.  Patient is a G3P1011 approx [redacted] weeks along with CC of vaginal bleeding and lower abdominal cramping.  She states that she has been seen recently for the same and has had confirmed IUP but with [facility].  She states that when the bleeding picked up today, she wanted to come back in and be evaluated.  She reports associated back pain. Seen in MAU on 5/10.  The history is provided by the patient. No language interpreter was used.       Past Medical History:  Diagnosis Date  . Angio-edema   . Asthma   . Recurrent upper respiratory infection (URI)   . Urticaria     Patient Active Problem List   Diagnosis Date Noted  . 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 Vaginal discharge 07/30/2018  . Upper respiratory tract infection 06/05/2018  . Seasonal and perennial allergic rhinitis 06/05/2018  . History of herpes labialis 01/22/2018  . Folliculitis 01/22/2018  . Chest pain on breathing 11/11/2017  . Possible pregnancy 11/11/2017  . Adverse food reaction 11/11/2017  . Recurrent pleurisy 07/19/2016  . Moderate persistent asthma 03/22/2015  . Food allergy 03/22/2015  . Moderate persistent asthma with acute exacerbation 03/09/2015  . Allergic rhinitis 03/09/2015    Past Surgical History:  Procedure Laterality Date  . CESAREAN SECTION    . CESAREAN SECTION  09/16/2010     OB History    Gravida  3   Para  1   Term  1   Preterm      AB  1   Living  1     SAB  1   IAB      Ectopic      Multiple      Live Births  1           Family History  Problem Relation Age of Onset  . Asthma Maternal Grandmother   . Lupus Mother   . Healthy Father   . Allergic rhinitis Neg Hx   . Angioedema Neg Hx   . Atopy Neg Hx   . Eczema Neg Hx   . Immunodeficiency  Neg Hx   . Urticaria Neg Hx     Social History   Tobacco Use  . Smoking status: Never Smoker  . Smokeless tobacco: Never Used  Vaping Use  . Vaping Use: Never used  Substance Use Topics  . Alcohol use: No    Alcohol/week: 0.0 standard drinks  . Drug use: No    Home Medications Prior to Admission medications   Medication Sig Start Date End Date Taking? Authorizing Provider  albuterol (PROVENTIL) (2.5 MG/3ML) 0.083% nebulizer solution Take 3 mLs (2.5 mg total) by nebulization every 6 (six) hours as needed for wheezing or shortness of breath. 02/25/20   Alfonse Spruce, MD  albuterol (VENTOLIN HFA) 108 (90 Base) MCG/ACT inhaler INHALE 4 PUFFS INTO THE LUNGS EVERY 4 HOURS AS NEEDED FOR WHEEZING OR SHORTNESS OF BREATH (COUGH). 02/25/20   Alfonse Spruce, MD  budesonide-formoterol (SYMBICORT) 160-4.5 MCG/ACT inhaler TAKE 2 PUFFS BY MOUTH TWICE A DAY Patient taking differently: Inhale 2 puffs into the lungs in the morning and at bedtime. 02/25/20   Alfonse Spruce, MD  cetirizine (ZYRTEC) 10 MG tablet Take 1 tablet (10  mg total) by mouth daily. 02/25/20   Alfonse Spruce, MD  Prenatal Vit-Fe Fumarate-FA (PRENATAL COMPLETE) 14-0.4 MG TABS Take 1 tablet by mouth daily. 09/13/20   Garlon Hatchet, PA-C  valACYclovir (VALTREX) 1000 MG tablet Take 1 tablet (1,000 mg total) by mouth 2 (two) times daily. Needs yearly appointment 07/18/20   Federico Flake, MD    Allergies    Effexor [venlafaxine], Other, and Shellfish allergy  Review of Systems   Review of Systems  All other systems reviewed and are negative.   Physical Exam Updated Vital Signs BP 116/78 (BP Location: Left Arm)   Pulse 83   Temp 98.2 F (36.8 C) (Oral)   Resp 17   LMP 08/09/2020   SpO2 100%   Physical Exam Vitals and nursing note reviewed.  Constitutional:      General: She is not in acute distress.    Appearance: She is well-developed.  HENT:     Head: Normocephalic and atraumatic.   Eyes:     Conjunctiva/sclera: Conjunctivae normal.  Cardiovascular:     Rate and Rhythm: Normal rate.     Heart sounds: No murmur heard.   Pulmonary:     Effort: Pulmonary effort is normal. No respiratory distress.  Abdominal:     General: There is no distension.  Musculoskeletal:     Cervical back: Neck supple.     Comments: Moves all extremities  Skin:    General: Skin is warm and dry.  Neurological:     Mental Status: She is alert and oriented to person, place, and time.  Psychiatric:        Mood and Affect: Mood normal.        Behavior: Behavior normal.     ED Results / Procedures / Treatments   Labs (all labs ordered are listed, but only abnormal results are displayed) Labs Reviewed - No data to display  EKG None  Radiology No results found.  Procedures Procedures   Medications Ordered in ED Medications - No data to display  ED Course  I have reviewed the triage vital signs and the nursing notes.  Pertinent labs & imaging results that were available during my care of the patient were reviewed by me and considered in my medical decision making (see chart for details).    MDM Rules/Calculators/A&P                         Patient with vaginal bleeding in pregnancy.  Seen recently at MAU for the same.  Reports that bleeding has increased.    Case discussed with MAU APP, who accepts patient in transfer. Final Clinical Impression(s) / ED Diagnoses Final diagnoses:  Vaginal bleeding in pregnancy    Rx / DC Orders ED Discharge Orders    None       Roxy Horseman, PA-C 09/25/20 0549    Zadie Rhine, MD 09/25/20 [phone]

## 2020-09-25 NOTE — MAU Provider Note (Signed)
History     CSN: [id number]  Arrival date and time: 09/25/20 0518   Event Date/Time   First Provider Initiated Contact with Patient 09/25/20 [phone]      Chief Complaint  Patient presents with  . Vaginal Bleeding  . Abdominal Pain   HPI  Ms.Danielle Velez is a 30 y.o. female G3P1011 @ [redacted]w[redacted]d here in MAU with complaints of vaginal bleeding. She had an US done on 5/10 which showed an IUP without a subchorionic hemorrhage. She has continued to bleed every day since her [place].   Period cramping this morning that woke her up from her sleep; this is a new symptom. The cramping is located in the lower part of her abdomen; both sides. She rates her pain 7/10; she took tylenol this morning which helps some.   OB History    Gravida  3   Para  1   Term  1   Preterm      AB  1   Living  1     SAB  1   IAB      Ectopic      Multiple      Live Births  1           Past Medical History:  Diagnosis Date  . Angio-edema   . Asthma   . Recurrent upper respiratory infection (URI)   . Urticaria     Past Surgical History:  Procedure Laterality Date  . CESAREAN SECTION    . CESAREAN SECTION  09/16/2010    Family History  Problem Relation Age of Onset  . Asthma Maternal Grandmother   . Lupus Mother   . Healthy Father   . Allergic rhinitis Neg Hx   . Angioedema Neg Hx   . Atopy Neg Hx   . Eczema Neg Hx   . Immunodeficiency Neg Hx   . Urticaria Neg Hx     Social History   Tobacco Use  . Smoking status: Never Smoker  . Smokeless tobacco: Never Used  Vaping Use  . Vaping Use: Never used  Substance Use Topics  . Alcohol use: No    Alcohol/week: 0.0 standard drinks  . Drug use: No    Allergies:  Allergies  Allergen Reactions  . Effexor [Venlafaxine] Other (See Comments)    Shaky   . Other Hives    DAIRY and WHEAT.  REACTION HIVES AND ITCHING.  Marland Kitchen Shellfish Allergy Itching and Swelling    Facility-Administered Medications Prior to Admission  Medication Dose  Route Frequency Provider Last Rate Last Admin  . omalizumab Geoffry Paradise) injection 300 mg  300 mg Subcutaneous Q28 days Alfonse Spruce, MD   300 mg at 09/16/19 5784   Medications Prior to Admission  Medication Sig Dispense Refill Last Dose  . Prenatal Vit-Fe Fumarate-FA (PRENATAL COMPLETE) 14-0.4 MG TABS Take 1 tablet by mouth daily. 60 tablet 0 09/24/2020 at Unknown time  . albuterol (PROVENTIL) (2.5 MG/3ML) 0.083% nebulizer solution Take 3 mLs (2.5 mg total) by nebulization every 6 (six) hours as needed for wheezing or shortness of breath. 75 mL 0   . albuterol (VENTOLIN HFA) 108 (90 Base) MCG/ACT inhaler INHALE 4 PUFFS INTO THE LUNGS EVERY 4 HOURS AS NEEDED FOR WHEEZING OR SHORTNESS OF BREATH (COUGH). 18 g 1   . budesonide-formoterol (SYMBICORT) 160-4.5 MCG/ACT inhaler TAKE 2 PUFFS BY MOUTH TWICE A DAY (Patient taking differently: Inhale 2 puffs into the lungs in the morning and at bedtime.) 30.6 g 5   .  cetirizine (ZYRTEC) 10 MG tablet Take 1 tablet (10 mg total) by mouth daily. 60 tablet 5   . valACYclovir (VALTREX) 1000 MG tablet Take 1 tablet (1,000 mg total) by mouth 2 (two) times daily. Needs yearly appointment 10 tablet 0    Results for orders placed or performed during the hospital encounter of 09/25/20 (from the past 48 hour(s))  Urinalysis, Routine w reflex microscopic Urine, Clean Catch     Status: Abnormal   Collection Time: 09/25/20  6:35 AM  Result Value Ref Range   Color, Urine YELLOW YELLOW   APPearance CLEAR CLEAR   Specific Gravity, Urine 1.012 1.005 - 1.030   pH 6.0 5.0 - 8.0   Glucose, UA NEGATIVE NEGATIVE mg/dL   Hgb urine dipstick LARGE (A) NEGATIVE   Bilirubin Urine NEGATIVE NEGATIVE   Ketones, ur 5 (A) NEGATIVE mg/dL   Protein, ur NEGATIVE NEGATIVE mg/dL   Nitrite NEGATIVE NEGATIVE   Leukocytes,Ua NEGATIVE NEGATIVE   RBC / HPF 0-5 0 - 5 RBC/hpf   WBC, UA 0-5 0 - 5 WBC/hpf   Bacteria, UA NONE SEEN NONE SEEN   Squamous Epithelial / LPF 0-5 0 - 5   Mucus  PRESENT     Comment: Performed at [facility], [address], [place], [place] [address]   [place] Transvaginal  Result Date: 09/25/2020 CLINICAL DATA:  Vaginal bleeding. EXAM: TRANSVAGINAL OB ULTRASOUND TECHNIQUE: Transvaginal ultrasound was performed for complete evaluation of the gestation as well as the maternal uterus, adnexal regions, and pelvic cul-de-sac. COMPARISON:  09/20/2020 FINDINGS: Intrauterine gestational sac: Single Yolk sac:  Visualized Embryo:  Visualized Cardiac Activity: Visualized Heart Rate: 113 bpm CRL:   4.6 mm   6 w 1 d                  [place] EDC: 05/20/2021 Subchorionic hemorrhage:  None visualized. Maternal uterus/adnexae: Subchorionic hemorrhage: None Right ovary: Normal Left ovary: Normal Other :Small fibroid measuring 9 x 6 x 10 mm. Free fluid:  Trace IMPRESSION: Single living intrauterine gestation with an estimated gestational age of [redacted] weeks and 1 day. No complicating features and no explanation for patient's vaginal bleeding. Electronically Signed   By: Signa Kell M.D.   On: 09/25/2020 08:01    Review of Systems  Constitutional: Negative for fever.  Gastrointestinal: Positive for abdominal pain.  Genitourinary: Positive for vaginal bleeding.   Physical Exam   Blood pressure (!) 104/54, pulse 89, temperature 98 F (36.7 C), temperature source Oral, resp. rate 16, last menstrual period 08/09/2020, SpO2 100 %.  Physical Exam Vitals and nursing note reviewed.  Constitutional:      General: She is not in acute distress.    Appearance: She is well-developed. She is not ill-appearing, toxic-appearing or diaphoretic.  Abdominal:     Palpations: Abdomen is soft.     Tenderness: There is abdominal tenderness in the suprapubic area. There is no guarding or rebound.  Genitourinary:    Comments: Bimanual exam: Cervix closed, posterior  Uterus non tender, enlarged size  Chaperone present for exam.  Musculoskeletal:        General: Normal range of motion.   Skin:    General: Skin is warm.  Neurological:     Mental Status: She is alert and oriented to person, place, and time.  Psychiatric:        Behavior: Behavior normal.    MAU Course  Procedures  MDM  [place] ordered  B positive blood type  Assessment and Plan   A:  1. Vaginal bleeding in pregnancy   2. Vaginal bleeding during pregnancy   3. [redacted] weeks gestation of pregnancy    P:  Discharge home in stable condition Reassurance given, no cause of bleeding at this time Continue pelvic rest Return to MAU if symptoms worsen  Zorian Gunderman, Harolyn Rutherford, NP 09/26/2020 1:35 PM

## 2020-09-25 NOTE — MAU Note (Signed)
Pt reports spotting since Friday, this am bleeding was heavier. Also reports lower abd cramping.

## 2020-09-28 ENCOUNTER — Other Ambulatory Visit: Payer: Self-pay

## 2020-09-28 ENCOUNTER — Ambulatory Visit: Payer: 59 | Admitting: Family Medicine

## 2020-09-28 ENCOUNTER — Encounter: Payer: Self-pay | Admitting: Family Medicine

## 2020-09-28 VITALS — BP 118/80 | HR 84 | Temp 98.2°F | Resp 18 | Ht 60.0 in | Wt 183.2 lb

## 2020-09-28 DIAGNOSIS — T7800XA Anaphylactic reaction due to unspecified food, initial encounter: Secondary | ICD-10-CM

## 2020-09-28 DIAGNOSIS — J4540 Moderate persistent asthma, uncomplicated: Secondary | ICD-10-CM

## 2020-09-28 DIAGNOSIS — L501 Idiopathic urticaria: Secondary | ICD-10-CM | POA: Diagnosis not present

## 2020-09-28 DIAGNOSIS — Z3A01 Less than 8 weeks gestation of pregnancy: Secondary | ICD-10-CM

## 2020-09-28 DIAGNOSIS — J302 Other seasonal allergic rhinitis: Secondary | ICD-10-CM | POA: Diagnosis not present

## 2020-09-28 DIAGNOSIS — J3089 Other allergic rhinitis: Secondary | ICD-10-CM

## 2020-09-28 MED ORDER — CETIRIZINE HCL 10 MG PO TABS: 10.0000 mg | ORAL_TABLET | Freq: Every day | ORAL | 5 refills | Status: DC

## 2020-09-28 MED ORDER — ALBUTEROL SULFATE HFA 108 (90 BASE) MCG/ACT IN AERS: INHALATION_SPRAY | RESPIRATORY_TRACT | 1 refills | Status: DC

## 2020-09-28 MED ORDER — BUDESONIDE-FORMOTEROL FUMARATE 160-4.5 MCG/ACT IN AERO: INHALATION_SPRAY | RESPIRATORY_TRACT | 1 refills | Status: DC

## 2020-09-28 MED ORDER — SPACER/AERO-HOLDING CHAMBERS DEVI: 1.0000 | Freq: Once | 0 refills | Status: AC

## 2020-09-28 MED ORDER — BUDESONIDE 32 MCG/ACT NA SUSP: NASAL | 2 refills | Status: DC

## 2020-09-28 NOTE — Progress Notes (Signed)
[address] [place] [address] Dept: [phone]  FOLLOW UP NOTE  Patient ID: Danielle Velez, female    DOB: [birth year]  Age: 30 y.o. MRN: [id number] Date of Office Visit: 09/28/2020  Assessment  Chief Complaint: Allergic Rhinitis  (Not having issues ), Asthma (Has been having a lot of asthma attacks - more attacks after finding out she is pregnant ), and Allergic Reaction (Dairy - hives all over body shellfish - she avoids it no issues )  HPI Danielle Velez is a 30 year old female who presents to the clinic for evaluation of urticaria and asthma. She was last seen in this clinic on 04/15/2020 by Nehemiah Settle, FNP, for evaluation of asthma, allergic rhinitis, pleurisy, and food allergy to shellfish, dairy, and wheat. At today's visit, she reports that she began to experience an urticarial flare that began 2 days ago after she ate cereal with almond milk. She reports hives occurring all over her body which were pruritic and some swelling in both hands, both feet, and around both ears. She took cetirizine 10 mg twice a day in addition to Claritin with a moderate amount of relief. She does have a history of chronic urticaria for which she has taken antihistamines and has also tried Xolair injections without much relief. 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 She reports that she has recently eaten some cheese and also some cereal. She reports that when she eats these foods her urticaria typically flares. Asthma is reported as not well controlled over the last 2 days with symptoms including slight chest tightness, slight wheeze, and intermittent cough producing clear mucus. She continues to use Symbicort 160-2 puffs once a day and occasionally before exercise. She reports that she mostly stays inside and is not very active at this time. She is not currently using a spacer with her inhalers. She is not using albuterol as she reports this medication does not give her any  relief. She is able to identify each of her inhalers on a chart with several inhalers pictured.  Allergic rhinitis is reported as not well controlled with symptoms including nasal congestion, occasional sneezing, and post nasal drainage with frequent throat clearing. She does report that she has seen some blood tinged mucus on the tissue after blowing her nose for the last 2 days. She denies dry nostrils. She is currently [redacted] weeks pregnant and has been to the emergency department 4 times (09/12/20, 09/17/20, 09/19/20, and 09/25/20) for abnormal vaginal bleeding during pregnancy. Her current medications are listed in the chart.    Drug Allergies:  Allergies  Allergen Reactions  . Effexor [Venlafaxine] Other (See Comments)    Shaky   . Other Hives    DAIRY and WHEAT.  REACTION HIVES AND ITCHING.  Marland Kitchen Shellfish Allergy Itching and Swelling    Physical Exam: BP 118/80   Pulse 84   Temp 98.2 F (36.8 C)   Resp 18   Ht 5' (1.524 m)   Wt 183 lb 3.2 oz (83.1 kg)   LMP 08/09/2020   SpO2 98%   BMI 35.78 kg/m    Physical Exam Vitals reviewed.  Constitutional:      Appearance: Normal appearance.  HENT:     Head: Normocephalic and atraumatic.     Right Ear: Tympanic membrane normal.     Left Ear: Tympanic membrane normal.     Nose:     Comments: Bilateral nares pale and edematous with clear nasal drainage noted. Pharynx  normal. Ears normal. Eyes normal.    Mouth/Throat:     Pharynx: Oropharynx is clear.  Eyes:     Conjunctiva/sclera: Conjunctivae normal.  Cardiovascular:     Rate and Rhythm: Normal rate and regular rhythm.     Heart sounds: Normal heart sounds. No murmur heard.   Pulmonary:     Effort: Pulmonary effort is normal.     Breath sounds: Normal breath sounds.     Comments: Lungs clear to auscultation Musculoskeletal:        General: Normal range of motion.     Cervical back: Normal range of motion and neck supple.  Skin:    General: Skin is warm.     Comments: Scattered  reddened areas noted on her forearms. No hives noted  Neurological:     Mental Status: She is alert and oriented to person, place, and time.  Psychiatric:        Mood and Affect: Mood normal.        Behavior: Behavior normal.        Thought Content: Thought content normal.        Judgment: Judgment normal.     Diagnostics: FVC 2.74, FEV1 2.26.  Predicted FVC 2.77, predicted FEV1 2.40.  Spirometry indicates normal ventilatory function.  Assessment and Plan: 1. Not well controlled moderate persistent asthma   2. Idiopathic urticaria   3. Seasonal and perennial allergic rhinitis   4. Anaphylactic shock due to food, initial encounter   5. Less than [redacted] weeks gestation of pregnancy     Meds ordered this encounter  Medications  . albuterol (VENTOLIN HFA) 108 (90 Base) MCG/ACT inhaler    Sig: Inhale 2 puffs every 4 hours as needed for cough, wheeze, tightness in chest, or shortness of breath.    Dispense:  18 g    Refill:  1  . Spacer/Aero-Holding Chambers DEVI    Sig: 1 each by Does not apply route once for 1 dose.    Dispense:  1 each    Refill:  0  . budesonide-formoterol (SYMBICORT) 160-4.5 MCG/ACT inhaler    Sig: Inhale 2 puffs twice a day with a spacer to prevent cough or wheeze. Rinse mouth after use.    Dispense:  10.2 g    Refill:  1  . budesonide (RHINOCORT AQUA) 32 MCG/ACT nasal spray    Sig: 1 spray in each nostril once a day as needed for stuffy nose.    Dispense:  8.43 mL    Refill:  2  . cetirizine (ZYRTEC) 10 MG tablet    Sig: Take 1 tablet (10 mg total) by mouth daily.    Dispense:  30 tablet    Refill:  5    Patient Instructions  Moderate persistent asthma Increase Symbicort 160-2 puffs twice a day with a spacer to prevent cough or wheeze May use albuterol 2 puffs every 4 hours as needed for cough, wheeze, tightness in chest, or shortness of breath. Asthma control goals:   Full participation in all desired activities (may need albuterol before  activity)  Albuterol use two time or less a week on average (not counting use with activity)  Cough interfering with sleep two time or less a month  Oral steroids no more than once a year  No hospitalizations  Allergic rhinitis Continue allergen avoidance measures directed toward pollens, molds, dust mite, pets, and cockroach as listed below.   Continue Zyrtec 10 mg once a day as needed for runny nose Begin Rhinocort  1 spray in each nostril once a day as needed for stuffy nose. In the right nostril, point the applicator out toward the right ear. In the left nostril, point the applicator out toward the left ear Consider saline nasal rinses as needed for nasal symptoms. Use this before any medicated nasal sprays for best result  Food allergy Avoid shellfish, wheat, and cow's milk. In case of an allergic reaction, give Benadryl 4 teaspoonfuls every 4 hours, and if life-threatening symptoms occur, inject with EpiPen 0.3 mg.  Chronic idiopathic urticaria Continue Zyrtec 10 mg once a day to control itch.  For breakthrough itching, take Zyrtec 10 mg twice a day.  Take the least amount of medication while continuing to be hive and itch free.  Make sure you clear these medications with your OB/GYN provider before beginning.  Please let us know if this treatment plan is not working well for you.  Schedule a follow-up appointment in 1 month   Return in about 4 weeks (around 10/26/2020), or if symptoms worsen or fail to improve.    Thank you for the opportunity to care for this patient.  Please do not hesitate to contact me with questions.  Thermon Leyland, FNP [facility]

## 2020-09-28 NOTE — Patient Instructions (Addendum)
Moderate persistent asthma Increase Symbicort 160-2 puffs twice a day with a spacer to prevent cough or wheeze May use albuterol 2 puffs every 4 hours as needed for cough, wheeze, tightness in chest, or shortness of breath. Asthma control goals:   Full participation in all desired activities (may need albuterol before activity)  Albuterol use two time or less a week on average (not counting use with activity)  Cough interfering with sleep two time or less a month  Oral steroids no more than once a year  No hospitalizations  Allergic rhinitis Continue allergen avoidance measures directed toward pollens, molds, dust mite, pets, and cockroach as listed below.   Continue Zyrtec 10 mg once a day as needed for runny nose Begin Rhinocort 1 spray in each nostril once a day as needed for stuffy nose. In the right nostril, point the applicator out toward the right ear. In the left nostril, point the applicator out toward the left ear Consider saline nasal rinses as needed for nasal symptoms. Use this before any medicated nasal sprays for best result  Food allergy Avoid shellfish, wheat, and cow's milk. In case of an allergic reaction, give Benadryl 4 teaspoonfuls every 4 hours, and if life-threatening symptoms occur, inject with EpiPen 0.3 mg.  Chronic idiopathic urticaria Continue Zyrtec 10 mg once a day to control itch.  For breakthrough itching, take Zyrtec 10 mg twice a day.  Take the least amount of medication while continuing to be hive and itch free.  Make sure you clear these medications with your OB/GYN provider before beginning.  Please let us know if this treatment plan is not working well for you.  Schedule a follow-up appointment in 1 month

## 2020-10-04 ENCOUNTER — Ambulatory Visit: Payer: 59 | Admitting: Allergy & Immunology

## 2020-10-15 ENCOUNTER — Inpatient Hospital Stay (HOSPITAL_COMMUNITY)
Admission: AD | Admit: 2020-10-15 | Discharge: 2020-10-15 | Disposition: A | Discharge status: Home / Self Care | Payer: 59 | Attending: Obstetrics and Gynecology | Admitting: Obstetrics and Gynecology

## 2020-10-15 ENCOUNTER — Other Ambulatory Visit: Payer: Self-pay

## 2020-10-15 ENCOUNTER — Encounter (HOSPITAL_COMMUNITY): Payer: Self-pay | Admitting: Obstetrics and Gynecology

## 2020-10-15 DIAGNOSIS — Z3A09 9 weeks gestation of pregnancy: Secondary | ICD-10-CM | POA: Diagnosis not present

## 2020-10-15 DIAGNOSIS — Z79899 Other long term (current) drug therapy: Secondary | ICD-10-CM | POA: Diagnosis not present

## 2020-10-15 DIAGNOSIS — O99611 Diseases of the digestive system complicating pregnancy, first trimester: Secondary | ICD-10-CM | POA: Insufficient documentation

## 2020-10-15 DIAGNOSIS — M549 Dorsalgia, unspecified: Secondary | ICD-10-CM | POA: Insufficient documentation

## 2020-10-15 DIAGNOSIS — O99891 Other specified diseases and conditions complicating pregnancy: Secondary | ICD-10-CM | POA: Diagnosis not present

## 2020-10-15 DIAGNOSIS — O26891 Other specified pregnancy related conditions, first trimester: Secondary | ICD-10-CM | POA: Diagnosis not present

## 2020-10-15 DIAGNOSIS — O2311 Infections of bladder in pregnancy, first trimester: Secondary | ICD-10-CM | POA: Diagnosis not present

## 2020-10-15 DIAGNOSIS — N3000 Acute cystitis without hematuria: Secondary | ICD-10-CM | POA: Insufficient documentation

## 2020-10-15 DIAGNOSIS — Z7951 Long term (current) use of inhaled steroids: Secondary | ICD-10-CM | POA: Diagnosis not present

## 2020-10-15 DIAGNOSIS — K5900 Constipation, unspecified: Secondary | ICD-10-CM | POA: Diagnosis not present

## 2020-10-15 DIAGNOSIS — N3001 Acute cystitis with hematuria: Secondary | ICD-10-CM

## 2020-10-15 LAB — CBC
HCT: 34.8 % — ABNORMAL LOW (ref 36.0–46.0)
Hemoglobin: 11.9 g/dL — ABNORMAL LOW (ref 12.0–15.0)
MCH: 27.7 pg (ref 26.0–34.0)
MCHC: 34.2 g/dL (ref 30.0–36.0)
MCV: 81.1 fL (ref 80.0–100.0)
Platelets: 287 10*3/uL (ref 150–400)
RBC: 4.29 MIL/uL (ref 3.87–5.11)
RDW: 12.9 % (ref 11.5–15.5)
WBC: 5.3 10*3/uL (ref 4.0–10.5)
nRBC: 0 % (ref 0.0–0.2)

## 2020-10-15 LAB — WET PREP, GENITAL
Clue Cells Wet Prep HPF POC: NONE SEEN
Sperm: NONE SEEN
Trich, Wet Prep: NONE SEEN
Yeast Wet Prep HPF POC: NONE SEEN

## 2020-10-15 MED ORDER — PHENAZOPYRIDINE HCL 200 MG PO TABS: 200.0000 mg | ORAL_TABLET | Freq: Three times a day (TID) | ORAL | 0 refills | Status: AC

## 2020-10-15 MED ORDER — DOCUSATE SODIUM 250 MG PO CAPS: 250.0000 mg | ORAL_CAPSULE | Freq: Every day | ORAL | 1 refills | Status: DC

## 2020-10-15 MED ORDER — ACETAMINOPHEN 500 MG PO TABS
1000.0000 mg | ORAL_TABLET | Freq: Once | ORAL | Status: AC
  Administered 2020-10-15: 1000 mg via ORAL
  Filled 2020-10-15: qty 2

## 2020-10-15 NOTE — [facility]U Provider Note (Signed)
History     CSN: [id number]  Arrival date and time: 10/15/20 6948   Event Date/Time   First Provider Initiated Contact with Patient 10/15/20 0802      Chief Complaint  Patient presents with  . Abdominal Pain   HPI Danielle Velez is a 30 y.o. G3P1011 at [redacted]w[redacted]d who presents with lower back pain. She states this has been ongoing for several weeks. She states she was diagnosed with a UTI this week and has taken 2 days worth of antibiotics. She still feels like she is having to bear down to urinate but the pain is gone. She also reports issues with constipation. She states she did have a bowel movement this morning but before today it had been several days. She also states when she lays on her left side, she feels more cramping. She denies any bleeding or discharge.   OB History    Gravida  3   Para  1   Term  1   Preterm      AB  1   Living  1     SAB  1   IAB      Ectopic      Multiple      Live Births  1           Past Medical History:  Diagnosis Date  . Angio-edema   . Asthma   . Recurrent upper respiratory infection (URI)   . Urticaria     Past Surgical History:  Procedure Laterality Date  . CESAREAN SECTION    . CESAREAN SECTION  09/16/2010    Family History  Problem Relation Age of Onset  . Asthma Maternal Grandmother   . Lupus Mother   . Healthy Father   . Allergic rhinitis Neg Hx   . Angioedema Neg Hx   . Atopy Neg Hx   . Eczema Neg Hx   . Immunodeficiency Neg Hx   . Urticaria Neg Hx     Social History   Tobacco Use  . Smoking status: Never Smoker  . Smokeless tobacco: Never Used  Vaping Use  . Vaping Use: Never used  Substance Use Topics  . Alcohol use: No    Alcohol/week: 0.0 standard drinks  . Drug use: No    Allergies:  Allergies  Allergen Reactions  . Effexor [Venlafaxine] Other (See Comments)    Shaky   . Other Hives    DAIRY and WHEAT.  REACTION HIVES AND ITCHING.  Marland Kitchen Shellfish Allergy Itching and Swelling     Medications Prior to Admission  Medication Sig Dispense Refill Last Dose  . albuterol (PROVENTIL) (2.5 MG/3ML) 0.083% nebulizer solution Take 3 mLs (2.5 mg total) by nebulization every 6 (six) hours as needed for wheezing or shortness of breath. 75 mL 0 Past Week at Unknown time  . albuterol (VENTOLIN HFA) 108 (90 Base) MCG/ACT inhaler Inhale 2 puffs every 4 hours as needed for cough, wheeze, tightness in chest, or shortness of breath. 18 g 1 Past Week at Unknown time  . budesonide-formoterol (SYMBICORT) 160-4.5 MCG/ACT inhaler Inhale 2 puffs twice a day with a spacer to prevent cough or wheeze. Rinse mouth after use. 10.2 g 1 Past Week at Unknown time  . cetirizine (ZYRTEC) 10 MG tablet Take 1 tablet (10 mg total) by mouth daily. 30 tablet 5 Past Week at Unknown time  . Prenatal Vit-Fe Fumarate-FA ([facility] PRENATAL) 27-0.8 MG TABS Take 1 tablet by mouth daily.   10/14/2020 at Unknown time  .  valACYclovir (VALTREX) 1000 MG tablet Take 1 tablet (1,000 mg total) by mouth 2 (two) times daily. Needs yearly appointment 10 tablet 0 10/14/2020 at Unknown time  . budesonide (RHINOCORT AQUA) 32 MCG/ACT nasal spray 1 spray in each nostril once a day as needed for stuffy nose. 8.43 mL 2 Unknown at Unknown time  . budesonide-formoterol (SYMBICORT) 160-4.5 MCG/ACT inhaler TAKE 2 PUFFS BY MOUTH TWICE A DAY (Patient taking differently: Inhale 2 puffs into the lungs in the morning and at bedtime.) 30.6 g 5   . Prenatal Vit-Fe Fumarate-FA (PRENATAL COMPLETE) 14-0.4 MG TABS Take 1 tablet by mouth daily. 60 tablet 0     Review of Systems  Constitutional: Negative.  Negative for fatigue and fever.  HENT: Negative.   Respiratory: Negative.  Negative for shortness of breath.   Cardiovascular: Negative.  Negative for chest pain.  Gastrointestinal: Negative.  Negative for abdominal pain, constipation, diarrhea, nausea and vomiting.  Genitourinary: Positive for difficulty urinating. Negative for dysuria, vaginal bleeding  and vaginal discharge.  Musculoskeletal: Positive for back pain.  Neurological: Negative.  Negative for dizziness and headaches.   Physical Exam   Blood pressure 105/61, pulse 62, temperature 98.1 F (36.7 C), temperature source Oral, resp. rate 17, height 5' (1.524 m), weight 82.4 kg, last menstrual period 08/09/2020, SpO2 100 %.  Physical Exam Vitals and nursing note reviewed.  Constitutional:      General: She is not in acute distress.    Appearance: She is well-developed.  HENT:     Head: Normocephalic.  Eyes:     Pupils: Pupils are equal, round, and reactive to light.  Cardiovascular:     Rate and Rhythm: Normal rate and regular rhythm.     Heart sounds: Normal heart sounds.  Pulmonary:     Effort: Pulmonary effort is normal. No respiratory distress.     Breath sounds: Normal breath sounds.  Abdominal:     General: Bowel sounds are normal. There is no distension.     Palpations: Abdomen is soft.     Tenderness: There is no abdominal tenderness.  Skin:    General: Skin is warm and dry.  Neurological:     Mental Status: She is alert and oriented to person, place, and time.  Psychiatric:        Behavior: Behavior normal.        Thought Content: Thought content normal.        Judgment: Judgment normal.     MAU Course  Procedures Results for orders placed or performed during the hospital encounter of 10/15/20 (from the past 24 hour(s))  CBC     Status: Abnormal   Collection Time: 10/15/20  8:13 AM  Result Value Ref Range   WBC 5.3 4.0 - 10.5 K/uL   RBC 4.29 3.87 - 5.11 MIL/uL   Hemoglobin 11.9 (L) 12.0 - 15.0 g/dL   HCT 21.3 (L) 08.6 - 57.8 %   MCV 81.1 80.0 - 100.0 fL   MCH 27.7 26.0 - 34.0 pg   MCHC 34.2 30.0 - 36.0 g/dL   RDW 46.9 62.9 - 52.8 %   Platelets 287 150 - 400 K/uL   nRBC 0.0 0.0 - 0.2 %  Wet prep, genital     Status: Abnormal   Collection Time: 10/15/20  8:22 AM  Result Value Ref Range   Yeast Wet Prep HPF POC NONE SEEN NONE SEEN   Trich, Wet  Prep NONE SEEN NONE SEEN   Clue Cells Wet Prep HPF POC  NONE SEEN NONE SEEN   WBC, Wet Prep HPF POC MANY (A) NONE SEEN   Sperm NONE SEEN    MDM UA deferred due to antibiotic use Wet prep and gc/chlamydia CBC Low suspicion for pyelonephritis due to VSS, lack of leukocytosis and no CVA tenderness  Pt informed that the ultrasound is considered a limited OB ultrasound and is not intended to be a complete ultrasound exam.  Patient also informed that the ultrasound is not being completed with the intent of assessing for fetal or placental anomalies or any pelvic abnormalities.  Explained that the purpose of today's ultrasound is to assess for  viability.  Patient acknowledges the purpose of the exam and the limitations of the study.    Active fetus noted with FHR 156 bpm.  Assessment and Plan   1. Back pain affecting pregnancy in first trimester   2. [redacted] weeks gestation of pregnancy   3. Acute cystitis without hematuria    -Discharge home in stable condition -Rx for pyridium and colace sent to patient's pharmacy -First trimester precautions discussed -Patient advised to follow-up with OB as scheduled for prenatal care -Patient may return to MAU as needed or if her condition were to change or worsen   Rolm Bookbinder CNM 10/15/2020, 8:03 AM

## 2020-10-15 NOTE — Discharge Instructions (Signed)
Safe Medications in Pregnancy   Acne: Benzoyl Peroxide Salicylic Acid  Backache/Headache: Tylenol: 2 regular strength every 4 hours OR              2 Extra strength every 6 hours  Colds/Coughs/Allergies: Benadryl (alcohol free) 25 mg every 6 hours as needed Breath right strips Claritin Cepacol throat lozenges Chloraseptic throat spray Cold-Eeze- up to three times per day Cough drops, alcohol free Flonase (by prescription only) Guaifenesin Mucinex Robitussin DM (plain only, alcohol free) Saline nasal spray/drops Sudafed (pseudoephedrine) & Actifed ** use only after [redacted] weeks gestation and if you do not have high blood pressure Tylenol Vicks Vaporub Zinc lozenges Zyrtec   Constipation: Colace Ducolax suppositories Fleet enema Glycerin suppositories Metamucil Milk of magnesia Miralax Senokot Smooth move tea  Diarrhea: Kaopectate Imodium A-D  *NO pepto Bismol  Hemorrhoids: Anusol Anusol HC Preparation H Tucks  Indigestion: Tums Maalox Mylanta Zantac  Pepcid  Insomnia: Benadryl (alcohol free) 25mg every 6 hours as needed Tylenol PM Unisom, no Gelcaps  Leg Cramps: Tums MagGel  Nausea/Vomiting:  Bonine Dramamine Emetrol Ginger extract Sea bands Meclizine  Nausea medication to take during pregnancy:  Unisom (doxylamine succinate 25 mg tablets) Take one tablet daily at bedtime. If symptoms are not adequately controlled, the dose can be increased to a maximum recommended dose of two tablets daily (1/2 tablet in the morning, 1/2 tablet mid-afternoon and one at bedtime). Vitamin B6 100mg tablets. Take one tablet twice a day (up to 200 mg per day).  Skin Rashes: Aveeno products Benadryl cream or 25mg every 6 hours as needed Calamine Lotion 1% cortisone cream  Yeast infection: Gyne-lotrimin 7 Monistat 7   **If taking multiple medications, please check labels to avoid duplicating the same active ingredients **take medication as directed on  the label ** Do not exceed 4000 mg of tylenol in 24 hours **Do not take medications that contain aspirin or ibuprofen    Obstetrics: Normal and Problem Pregnancies (7th ed., pp. 102-121). [place], [place]: Elsevier."> Textbook of Family Medicine (9th ed., pp. 365-410). [place], [place]: [employer].">  First Trimester of Pregnancy  The first trimester of pregnancy starts on the first day of your last menstrual period until the end of week 12. This is months 1 through 3 of pregnancy. A week after a sperm fertilizes an egg, the egg will implant into the wall of the uterus and begin to develop into a baby. By the end of 12 weeks, all the baby's organs will be formed and the baby will be 2-3 inches in size. Body changes during your first trimester Your body goes through many changes during pregnancy. The changes vary and generally return to normal after your baby is born. Physical changes  You may gain or lose weight.  Your breasts may begin to grow larger and become tender. The tissue that surrounds your nipples (areola) may become darker.  Dark spots or blotches (chloasma or mask of pregnancy) may develop on your face.  You may have changes in your hair. These can include thickening or thinning of your hair or changes in texture. Health changes  You may feel nauseous, and you may vomit.  You may have heartburn.  You may develop headaches.  You may develop constipation.  Your gums may bleed and may be sensitive to brushing and flossing. Other changes  You may tire easily.  You may urinate more often.  Your menstrual periods will stop.  You may have a loss of appetite.  You may develop   cravings for certain kinds of food.  You may have changes in your emotions from day to day.  You may have more vivid and strange dreams. Follow these instructions at home: Medicines  Follow your health care provider's instructions regarding medicine use. Specific medicines may be  either safe or unsafe to take during pregnancy. Do not take any medicines unless told to by your health care provider.  Take a prenatal vitamin that contains at least 600 micrograms (mcg) of folic acid. Eating and drinking  Eat a healthy diet that includes fresh fruits and vegetables, whole grains, good sources of protein such as meat, eggs, or tofu, and low-fat dairy products.  Avoid raw meat and unpasteurized juice, milk, and cheese. These carry germs that can harm you and your baby.  If you feel nauseous or you vomit: ? Eat 4 or 5 small meals a day instead of 3 large meals. ? Try eating a few soda crackers. ? Drink liquids between meals instead of during meals.  You may need to take these actions to prevent or treat constipation: ? Drink enough fluid to keep your urine pale yellow. ? Eat foods that are high in fiber, such as beans, whole grains, and fresh fruits and vegetables. ? Limit foods that are high in fat and processed sugars, such as fried or sweet foods. Activity  Exercise only as directed by your health care provider. Most people can continue their usual exercise routine during pregnancy. Try to exercise for 30 minutes at least 5 days a week.  Stop exercising if you develop pain or cramping in the lower abdomen or lower back.  Avoid exercising if it is very hot or humid or if you are at high altitude.  Avoid heavy lifting.  If you choose to, you may have sex unless your health care provider tells you not to. Relieving pain and discomfort  Wear a good support bra to relieve breast tenderness.  Rest with your legs elevated if you have leg cramps or low back pain.  If you develop bulging veins (varicose veins) in your legs: ? Wear support hose as told by your health care provider. ? Elevate your feet for 15 minutes, 3-4 times a day. ? Limit salt in your diet. Safety  Wear your seat belt at all times when driving or riding in a car.  Talk with your health care  provider if someone is verbally or physically abusive to you.  Talk with your health care provider if you are feeling sad or have thoughts of hurting yourself. Lifestyle  Do not use hot tubs, steam rooms, or saunas.  Do not douche. Do not use tampons or scented sanitary pads.  Do not use herbal remedies, alcohol, illegal drugs, or medicines that are not approved by your health care provider. Chemicals in these products can harm your baby.  Do not use any products that contain nicotine or tobacco, such as cigarettes, e-cigarettes, and chewing tobacco. If you need help quitting, ask your health care provider.  Avoid cat litter boxes and soil used by cats. These carry germs that can cause birth defects in the baby and possibly loss of the unborn baby (fetus) by miscarriage or stillbirth. General instructions  During routine prenatal visits in the first trimester, your health care provider will do a physical exam, perform necessary tests, and ask you how things are going. Keep all follow-up visits. This is important.  Ask for help if you have counseling or nutritional needs during pregnancy. Your   health care provider can offer advice or refer you to specialists for help with various needs.  Schedule a dentist appointment. At home, brush your teeth with a soft toothbrush. Floss gently.  Write down your questions. Take them to your prenatal visits. Where to find more information  American Pregnancy Association: americanpregnancy.org  American College of Obstetricians and Gynecologists: acog.org/en/Womens%20Health/Pregnancy  Office on Women's Health: womenshealth.gov/pregnancy Contact a health care provider if you have:  Dizziness.  A fever.  Mild pelvic cramps, pelvic pressure, or nagging pain in the abdominal area.  Nausea, vomiting, or diarrhea that lasts for 24 hours or longer.  A bad-smelling vaginal discharge.  Pain when you urinate.  Known exposure to a contagious illness,  such as chickenpox, measles, Zika virus, HIV, or hepatitis. Get help right away if you have:  Spotting or bleeding from your vagina.  Severe abdominal cramping or pain.  Shortness of breath or chest pain.  Any kind of trauma, such as from a fall or a car crash.  New or increased pain, swelling, or redness in an arm or leg. Summary  The first trimester of pregnancy starts on the first day of your last menstrual period until the end of week 12 (months 1 through 3).  Eating 4 or 5 small meals a day rather than 3 large meals may help to relieve nausea and vomiting.  Do not use any products that contain nicotine or tobacco, such as cigarettes, e-cigarettes, and chewing tobacco. If you need help quitting, ask your health care provider.  Keep all follow-up visits. This is important. This information is not intended to replace advice given to you by your health care provider. Make sure you discuss any questions you have with your health care provider. Document Revised: 10/07/2019 Document Reviewed: 08/13/2019 Elsevier Patient Education  2021 [employer] Inc.  

## 2020-10-15 NOTE — MAU Note (Signed)
Has been cramping off and on the last 2 wks, getting worse, now back is hurting.  Has been constipated, though did have a BM this morning, was started on antibiotics for a UTI 2 days ago.  Called office, was told to come in. No bleeding or d/c.

## 2020-10-17 LAB — GC/CHLAMYDIA PROBE AMP (~~LOC~~) NOT AT ARMC
Chlamydia: NEGATIVE
Comment: NEGATIVE
Comment: NORMAL
Neisseria Gonorrhea: NEGATIVE

## 2020-10-20 LAB — OB RESULTS CONSOLE ABO/RH: RH Type: POSITIVE

## 2020-10-20 LAB — OB RESULTS CONSOLE ANTIBODY SCREEN: Antibody Screen: NEGATIVE

## 2020-10-20 LAB — HM PAP SMEAR: HM Pap smear: POSITIVE

## 2020-10-20 LAB — OB RESULTS CONSOLE RUBELLA ANTIBODY, IGM: Rubella: IMMUNE

## 2020-10-20 LAB — HEPATITIS C ANTIBODY: HCV Ab: NEGATIVE

## 2020-10-20 LAB — OB RESULTS CONSOLE RPR: RPR: NONREACTIVE

## 2020-10-20 LAB — OB RESULTS CONSOLE HEPATITIS B SURFACE ANTIGEN: Hepatitis B Surface Ag: NEGATIVE

## 2020-10-20 LAB — OB RESULTS CONSOLE GC/CHLAMYDIA
Chlamydia: NEGATIVE
Gonorrhea: NEGATIVE

## 2020-12-01 ENCOUNTER — Encounter (HOSPITAL_COMMUNITY): Payer: Self-pay | Admitting: Obstetrics and Gynecology

## 2020-12-01 ENCOUNTER — Other Ambulatory Visit: Payer: Self-pay

## 2020-12-01 ENCOUNTER — Inpatient Hospital Stay (HOSPITAL_COMMUNITY)
Admission: AD | Admit: 2020-12-01 | Discharge: 2020-12-01 | Disposition: A | Discharge status: Home / Self Care | Payer: 59 | Attending: Obstetrics and Gynecology | Admitting: Obstetrics and Gynecology

## 2020-12-01 DIAGNOSIS — R109 Unspecified abdominal pain: Secondary | ICD-10-CM | POA: Diagnosis not present

## 2020-12-01 DIAGNOSIS — Z3A16 16 weeks gestation of pregnancy: Secondary | ICD-10-CM

## 2020-12-01 DIAGNOSIS — O26892 Other specified pregnancy related conditions, second trimester: Secondary | ICD-10-CM

## 2020-12-01 DIAGNOSIS — R102 Pelvic and perineal pain: Secondary | ICD-10-CM | POA: Diagnosis not present

## 2020-12-01 DIAGNOSIS — Z7951 Long term (current) use of inhaled steroids: Secondary | ICD-10-CM | POA: Diagnosis not present

## 2020-12-01 DIAGNOSIS — O99512 Diseases of the respiratory system complicating pregnancy, second trimester: Secondary | ICD-10-CM | POA: Insufficient documentation

## 2020-12-01 DIAGNOSIS — N949 Unspecified condition associated with female genital organs and menstrual cycle: Secondary | ICD-10-CM

## 2020-12-01 DIAGNOSIS — N9489 Other specified conditions associated with female genital organs and menstrual cycle: Secondary | ICD-10-CM

## 2020-12-01 DIAGNOSIS — J45909 Unspecified asthma, uncomplicated: Secondary | ICD-10-CM | POA: Diagnosis not present

## 2020-12-01 DIAGNOSIS — Z79899 Other long term (current) drug therapy: Secondary | ICD-10-CM | POA: Diagnosis not present

## 2020-12-01 LAB — URINALYSIS, ROUTINE W REFLEX MICROSCOPIC
Bilirubin Urine: NEGATIVE
Glucose, UA: NEGATIVE mg/dL
Hgb urine dipstick: NEGATIVE
Ketones, ur: NEGATIVE mg/dL
Nitrite: NEGATIVE
Protein, ur: NEGATIVE mg/dL
Specific Gravity, Urine: 1.016 (ref 1.005–1.030)
pH: 6 (ref 5.0–8.0)

## 2020-12-01 LAB — WET PREP, GENITAL
Clue Cells Wet Prep HPF POC: NONE SEEN
Sperm: NONE SEEN
Trich, Wet Prep: NONE SEEN
Yeast Wet Prep HPF POC: NONE SEEN

## 2020-12-01 NOTE — MAU Provider Note (Signed)
History     CSN: [id number]  Arrival date and time: 12/01/20 1308   Event Date/Time   First Provider Initiated Contact with Patient 12/01/20 [phone]      Chief Complaint  Patient presents with   Abdominal Pain   [name] is a 30 y.o. G3P1011 at [redacted]w[redacted]d who presents to MAU for abdominal cramping. When asked to describe abdominal cramping, patient states it is a sharp shooting pain that is worse with movement, especially going up stairs and going from a sitting to standing position. Patient identifies area of pain as lower abdomen and sides of abdomen. Patient reports pain is intermittent and Tylenol does help some.  Pt denies VB, LOF, ctx, decreased FM, vaginal discharge/odor/itching. Pt denies N/V, constipation, diarrhea, or urinary problems. Pt denies fever, chills, fatigue, sweating or changes in appetite. Pt denies SOB or chest pain. Pt denies dizziness, HA, light-headedness, weakness.   OB History     Gravida  3   Para  1   Term  1   Preterm      AB  1   Living  1      SAB  1   IAB      Ectopic      Multiple      Live Births  1           Past Medical History:  Diagnosis Date   Angio-edema    Asthma    Recurrent upper respiratory infection (URI)    Urticaria     Past Surgical History:  Procedure Laterality Date   CESAREAN SECTION     CESAREAN SECTION  09/16/2010    Family History  Problem Relation Age of Onset   Asthma Maternal Grandmother    Lupus Mother    Healthy Father    Allergic rhinitis Neg Hx    Angioedema Neg Hx    Atopy Neg Hx    Eczema Neg Hx    Immunodeficiency Neg Hx    Urticaria Neg Hx     Social History   Tobacco Use   Smoking status: Never   Smokeless tobacco: Never  Vaping Use   Vaping Use: Never used  Substance Use Topics   Alcohol use: No    Alcohol/week: 0.0 standard drinks   Drug use: No    Allergies:  Allergies  Allergen Reactions   Effexor [Venlafaxine] Other (See Comments)    Shaky     Other Hives    DAIRY and WHEAT.  REACTION HIVES AND ITCHING.   Shellfish Allergy Itching and Swelling    Medications Prior to Admission  Medication Sig Dispense Refill Last Dose   cetirizine (ZYRTEC) 10 MG tablet Take 1 tablet (10 mg total) by mouth daily. 30 tablet 5 Past Week   Prenatal Vit-Fe Fumarate-FA (PRENATAL COMPLETE) 14-0.4 MG TABS Take 1 tablet by mouth daily. 60 tablet 0 12/01/2020   valACYclovir (VALTREX) 1000 MG tablet Take 1 tablet (1,000 mg total) by mouth 2 (two) times daily. Needs yearly appointment 10 tablet 0 Past Month   albuterol (PROVENTIL) (2.5 MG/3ML) 0.083% nebulizer solution Take 3 mLs (2.5 mg total) by nebulization every 6 (six) hours as needed for wheezing or shortness of breath. 75 mL 0    albuterol (VENTOLIN HFA) 108 (90 Base) MCG/ACT inhaler Inhale 2 puffs every 4 hours as needed for cough, wheeze, tightness in chest, or shortness of breath. 18 g 1    budesonide (RHINOCORT AQUA) 32 MCG/ACT nasal spray 1 spray in each nostril  once a day as needed for stuffy nose. 8.43 mL 2    budesonide-formoterol (SYMBICORT) 160-4.5 MCG/ACT inhaler TAKE 2 PUFFS BY MOUTH TWICE A DAY (Patient taking differently: Inhale 2 puffs into the lungs in the morning and at bedtime.) 30.6 g 5    budesonide-formoterol (SYMBICORT) 160-4.5 MCG/ACT inhaler Inhale 2 puffs twice a day with a spacer to prevent cough or wheeze. Rinse mouth after use. 10.2 g 1    docusate sodium (COLACE) 250 MG capsule Take 1 capsule (250 mg total) by mouth daily. 30 capsule 1 More than a month   Prenatal Vit-Fe Fumarate-FA ([facility] PRENATAL) 27-0.8 MG TABS Take 1 tablet by mouth daily.       Review of Systems  Constitutional:  Negative for chills, diaphoresis, fatigue and fever.  Eyes:  Negative for visual disturbance.  Respiratory:  Negative for shortness of breath.   Cardiovascular:  Negative for chest pain.  Gastrointestinal:  Positive for abdominal pain. Negative for constipation, diarrhea, nausea and vomiting.   Genitourinary:  Negative for dysuria, flank pain, frequency, pelvic pain, urgency, vaginal bleeding and vaginal discharge.  Neurological:  Negative for dizziness, weakness, light-headedness and headaches.   Physical Exam   Blood pressure (!) 118/55, pulse 74, temperature 98.6 F (37 C), resp. rate 18, height 5' (1.524 m), weight 83.5 kg, last menstrual period 08/09/2020.  Patient Vitals for the past 24 hrs:  BP Temp Pulse Resp SpO2 Height Weight  12/01/20 1532 (!) 111/53 -- 65 17 100 % -- --  12/01/20 1329 (!) 118/55 98.6 F (37 C) 74 18 -- 5' (1.524 m) 83.5 kg   Physical Exam Constitutional:      General: She is not in acute distress.    Appearance: She is well-developed. She is not diaphoretic.  HENT:     Head: Normocephalic and atraumatic.  Pulmonary:     Effort: Pulmonary effort is normal.  Abdominal:     General: There is no distension.     Palpations: Abdomen is soft. There is no mass.     Tenderness: There is no abdominal tenderness. There is no guarding or rebound.  Skin:    General: Skin is warm and dry.  Neurological:     Mental Status: She is alert and oriented to person, place, and time.  Psychiatric:        Behavior: Behavior normal.        Thought Content: Thought content normal.        Judgment: Judgment normal.   Results for orders placed or performed during the hospital encounter of 12/01/20 (from the past 24 hour(s))  Urinalysis, Routine w reflex microscopic Urine, Clean Catch     Status: Abnormal   Collection Time: 12/01/20  2:16 PM  Result Value Ref Range   Color, Urine YELLOW YELLOW   APPearance HAZY (A) CLEAR   Specific Gravity, Urine 1.016 1.005 - 1.030   pH 6.0 5.0 - 8.0   Glucose, UA NEGATIVE NEGATIVE mg/dL   Hgb urine dipstick NEGATIVE NEGATIVE   Bilirubin Urine NEGATIVE NEGATIVE   Ketones, ur NEGATIVE NEGATIVE mg/dL   Protein, ur NEGATIVE NEGATIVE mg/dL   Nitrite NEGATIVE NEGATIVE   Leukocytes,Ua TRACE (A) NEGATIVE   RBC / HPF 0-5 0 - 5  RBC/hpf   WBC, UA 0-5 0 - 5 WBC/hpf   Bacteria, UA RARE (A) NONE SEEN   Squamous Epithelial / LPF 6-10 0 - 5   No results found.  MAU Course  Procedures  MDM -suspect RLP, r/o PTL -  UA: hazy/trace leuks/rare bacteria, sending urine for culture based on symptoms -CE: long/closed/posterior -WetPrep: pending at time of discharge -GC/CT collected -FHT 150 -pt discharged to home in stable condition  Orders Placed This Encounter  Procedures   Wet prep, genital    Standing Status:   Standing    Number of Occurrences:   1   Culture, OB Urine    Standing Status:   Standing    Number of Occurrences:   1   Urinalysis, Routine w reflex microscopic Urine, Clean Catch    Standing Status:   Standing    Number of Occurrences:   1   No orders of the defined types were placed in this encounter.  Assessment and Plan   1. Round ligament pain   2. [redacted] weeks gestation of pregnancy    Allergies as of 12/01/2020       Reactions   Effexor [venlafaxine] Other (See Comments)   Shaky    Other Hives   DAIRY and WHEAT.  REACTION HIVES AND ITCHING.   Shellfish Allergy Itching, Swelling        Medication List     TAKE these medications    albuterol (2.5 MG/3ML) 0.083% nebulizer solution Commonly known as: PROVENTIL Take 3 mLs (2.5 mg total) by nebulization every 6 (six) hours as needed for wheezing or shortness of breath.   albuterol 108 (90 Base) MCG/ACT inhaler Commonly known as: VENTOLIN HFA Inhale 2 puffs every 4 hours as needed for cough, wheeze, tightness in chest, or shortness of breath.   budesonide 32 MCG/ACT nasal spray Commonly known as: RHINOCORT AQUA 1 spray in each nostril once a day as needed for stuffy nose.   budesonide-formoterol 160-4.5 MCG/ACT inhaler Commonly known as: Symbicort Inhale 2 puffs twice a day with a spacer to prevent cough or wheeze. Rinse mouth after use.   cetirizine 10 MG tablet Commonly known as: ZYRTEC Take 1 tablet (10 mg total) by mouth  daily.   docusate sodium 250 MG capsule Commonly known as: COLACE Take 1 capsule (250 mg total) by mouth daily.   Prenatal Complete 14-0.4 MG Tabs Take 1 tablet by mouth daily.   [facility]S Prenatal 27-0.8 MG Tabs Take 1 tablet by mouth daily.   valACYclovir 1000 MG tablet Commonly known as: Valtrex Take 1 tablet (1,000 mg total) by mouth 2 (two) times daily. Needs yearly appointment       ASK your doctor about these medications    budesonide-formoterol 160-4.5 MCG/ACT inhaler Commonly known as: Symbicort TAKE 2 PUFFS BY MOUTH TWICE A DAY       -return MAU precautions given -pt discharged to home in stable condition  Odie Sera Kewanna Kasprzak 12/01/2020, 3:32 PM

## 2020-12-01 NOTE — MAU Note (Signed)
Pt reports cramping on and off for about 2 weeks. Called OB office and told her it could be round ligament pain. Taking tylenol with some relief. Last night she said she felt "wet" fluid looked clear. Stated having a lot of pelvic pressure and pain today. Denies any pain.

## 2020-12-02 LAB — CULTURE, OB URINE

## 2020-12-02 LAB — GC/CHLAMYDIA PROBE AMP (~~LOC~~) NOT AT ARMC
Chlamydia: NEGATIVE
Comment: NEGATIVE
Comment: NORMAL
Neisseria Gonorrhea: NEGATIVE

## 2020-12-29 ENCOUNTER — Other Ambulatory Visit: Payer: Self-pay

## 2020-12-29 ENCOUNTER — Inpatient Hospital Stay (HOSPITAL_COMMUNITY)
Admission: AD | Admit: 2020-12-29 | Discharge: 2020-12-29 | Disposition: A | Discharge status: Home / Self Care | Payer: 59 | Attending: Obstetrics and Gynecology | Admitting: Obstetrics and Gynecology

## 2020-12-29 DIAGNOSIS — R109 Unspecified abdominal pain: Secondary | ICD-10-CM | POA: Insufficient documentation

## 2020-12-29 DIAGNOSIS — W109XXA Fall (on) (from) unspecified stairs and steps, initial encounter: Secondary | ICD-10-CM | POA: Diagnosis not present

## 2020-12-29 DIAGNOSIS — O26892 Other specified pregnancy related conditions, second trimester: Secondary | ICD-10-CM | POA: Insufficient documentation

## 2020-12-29 DIAGNOSIS — Z3A20 20 weeks gestation of pregnancy: Secondary | ICD-10-CM

## 2020-12-29 DIAGNOSIS — W19XXXA Unspecified fall, initial encounter: Secondary | ICD-10-CM

## 2020-12-29 DIAGNOSIS — O9A212 Injury, poisoning and certain other consequences of external causes complicating pregnancy, second trimester: Secondary | ICD-10-CM

## 2020-12-29 DIAGNOSIS — Z7951 Long term (current) use of inhaled steroids: Secondary | ICD-10-CM | POA: Diagnosis not present

## 2020-12-29 DIAGNOSIS — Z79899 Other long term (current) drug therapy: Secondary | ICD-10-CM | POA: Insufficient documentation

## 2020-12-29 DIAGNOSIS — O9A219 Injury, poisoning and certain other consequences of external causes complicating pregnancy, unspecified trimester: Secondary | ICD-10-CM

## 2020-12-29 MED ORDER — CYCLOBENZAPRINE HCL 5 MG PO TABS: 5.0000 mg | ORAL_TABLET | Freq: Three times a day (TID) | ORAL | 0 refills | Status: DC | PRN

## 2020-12-29 NOTE — Discharge Instructions (Addendum)
You were seen in the MAU after a fall. Luckily you did not hit your abdomen. Baby has a normal heart rate and looks good on ultrasound. Watch your symptoms at home, if you develop heavy vaginal bleeding needing more than one pad an hour, severe abdominal pain, have a large gush of fluid, or a fever, return to the MAU.  I have prescribed a small amount of flexeril, a muscle relaxant, for your discomfort. You can take this along with tylenol for muscle aches related to the fall. It can make some people feel sleepy or sedated, so try taking it before bed initially.

## 2020-12-29 NOTE — MAU Note (Signed)
Presents stating she fell down the steps this morning @ approximately 0730. Reports hit back and buttocks, didn't directly strike abdomen.  States since fall has sharp lower abdominal and back pain.  Denies VB or LOF.

## 2020-12-29 NOTE — MAU Provider Note (Signed)
History     [id number]  Arrival date and time: 12/29/20 9509    Chief Complaint  Patient presents with   Fall     HPI Danielle Velez is a 30 y.o. at [redacted]w[redacted]d, who presents for fall.   Patient was at home shortly before arrival and fell while going down the stairs She did not hit her abdomen Larey Seat on her backside and slid down a few steps before stopping Having some sharp intermittent pain inguinally as well as on her back from where she fell Denies vaginal bleeding or large gush of fluid Has felt fetal movement Has not taken anything for the pain yet   --/--/B POS (05/07 0736)  OB History     Gravida  3   Para  1   Term  1   Preterm      AB  1   Living  1      SAB  1   IAB      Ectopic      Multiple      Live Births  1           Past Medical History:  Diagnosis Date   Angio-edema    Asthma    Recurrent upper respiratory infection (URI)    Urticaria     Past Surgical History:  Procedure Laterality Date   CESAREAN SECTION     CESAREAN SECTION  09/16/2010    Family History  Problem Relation Age of Onset   Asthma Maternal Grandmother    Lupus Mother    Healthy Father    Allergic rhinitis Neg Hx    Angioedema Neg Hx    Atopy Neg Hx    Eczema Neg Hx    Immunodeficiency Neg Hx    Urticaria Neg Hx     Social History   Socioeconomic History   Marital status: Single    Spouse name: Not on file   Number of children: Not on file   Years of education: Not on file   Highest education level: Not on file  Occupational History   Not on file  Tobacco Use   Smoking status: Never   Smokeless tobacco: Never  Vaping Use   Vaping Use: Never used  Substance and Sexual Activity   Alcohol use: No    Alcohol/week: 0.0 standard drinks   Drug use: No   Sexual activity: Not Currently  Other Topics Concern   Not on file  Social History Narrative   Not on file   Social Determinants of Health   Financial Resource Strain: Not on file  Food  Insecurity: Not on file  Transportation Needs: Not on file  Physical Activity: Not on file  Stress: Not on file  Social Connections: Not on file  Intimate Partner Violence: Not on file    Allergies  Allergen Reactions   Effexor [Venlafaxine] Other (See Comments)    Shaky    Other Hives    DAIRY and WHEAT.  REACTION HIVES AND ITCHING.   Shellfish Allergy Itching and Swelling    No current facility-administered medications on file prior to encounter.   Current Outpatient Medications on File Prior to Encounter  Medication Sig Dispense Refill   albuterol (PROVENTIL) (2.5 MG/3ML) 0.083% nebulizer solution Take 3 mLs (2.5 mg total) by nebulization every 6 (six) hours as needed for wheezing or shortness of breath. 75 mL 0   albuterol (VENTOLIN HFA) 108 (90 Base) MCG/ACT inhaler Inhale 2 puffs every 4 hours as needed for  cough, wheeze, tightness in chest, or shortness of breath. 18 g 1   budesonide (RHINOCORT AQUA) 32 MCG/ACT nasal spray 1 spray in each nostril once a day as needed for stuffy nose. 8.43 mL 2   budesonide-formoterol (SYMBICORT) 160-4.5 MCG/ACT inhaler TAKE 2 PUFFS BY MOUTH TWICE A DAY (Patient taking differently: Inhale 2 puffs into the lungs in the morning and at bedtime.) 30.6 g 5   budesonide-formoterol (SYMBICORT) 160-4.5 MCG/ACT inhaler Inhale 2 puffs twice a day with a spacer to prevent cough or wheeze. Rinse mouth after use. 10.2 g 1   cetirizine (ZYRTEC) 10 MG tablet Take 1 tablet (10 mg total) by mouth daily. 30 tablet 5   docusate sodium (COLACE) 250 MG capsule Take 1 capsule (250 mg total) by mouth daily. 30 capsule 1   Prenatal Vit-Fe Fumarate-FA ([facility] PRENATAL) 27-0.8 MG TABS Take 1 tablet by mouth daily.     Prenatal Vit-Fe Fumarate-FA (PRENATAL COMPLETE) 14-0.4 MG TABS Take 1 tablet by mouth daily. 60 tablet 0   valACYclovir (VALTREX) 1000 MG tablet Take 1 tablet (1,000 mg total) by mouth 2 (two) times daily. Needs yearly appointment 10 tablet 0      ROS Pertinent positives and negative per HPI, all others reviewed and negative  Physical Exam   BP (!) 110/58 (BP Location: Right Arm)   Pulse 68   Temp 97.7 F (36.5 C) (Oral)   Resp 18   Ht 5' (1.524 m)   Wt 86.1 kg   LMP 08/09/2020   SpO2 100%   BMI 37.07 kg/m   Patient Vitals for the past 24 hrs:  BP Temp Temp src Pulse Resp SpO2 Height Weight  12/29/20 0923 (!) 110/58 97.7 F (36.5 C) Oral 68 18 100 % -- --  12/29/20 0916 -- -- -- -- -- -- 5' (1.524 m) 86.1 kg    Physical Exam Vitals reviewed.  Constitutional:      General: She is not in acute distress.    Appearance: She is well-developed. She is not diaphoretic.  Eyes:     General: No scleral icterus. Pulmonary:     Effort: Pulmonary effort is normal. No respiratory distress.  Abdominal:     General: There is no distension.     Palpations: Abdomen is soft.     Tenderness: There is no abdominal tenderness. There is no guarding or rebound.  Skin:    General: Skin is warm and dry.  Neurological:     Mental Status: She is alert.     Coordination: Coordination normal.     Cervical Exam    Bedside Ultrasound Pt informed that the ultrasound is considered a limited OB ultrasound and is not intended to be a complete ultrasound exam.  Patient also informed that the ultrasound is not being completed with the intent of assessing for fetal or placental anomalies or any pelvic abnormalities.  Explained that the purpose of today's ultrasound is to assess for  AFI and viability.  Patient acknowledges the purpose of the exam and the limitations of the study.    My interpretation: live IUP with normal somatic and cardiac activity. Subjectively normal fluid. Posterior placenta, unremarkable in appearance.   FHT 147 bpm by doppler  Labs No results found for this or any previous visit (from the past 24 hour(s)).  Imaging No results found.  MAU Course  Procedures Lab Orders  No laboratory test(s) ordered today    Meds ordered this encounter  Medications   cyclobenzaprine (FLEXERIL) 5 MG  tablet    Sig: Take 1 tablet (5 mg total) by mouth 3 (three) times daily as needed for muscle spasms.    Dispense:  10 tablet    Refill:  0   Imaging Orders  No imaging studies ordered today    MDM moderate  Assessment and Plan  #Trauma during pregnancy, second trimester Patient with no direct abdominal trauma. Normal fetal heart tones and unremarkable limited bedside US. Reassured patient that there are currently no concerning findings. Blood type B+, no indication for rhogam. Discussed warning signs of loss of fluid, heavy vaginal bleeding, severe abdominal pain, fever.  #FWB FHT 147 bpm by doppler  Discharged to home in stable condition with return precautions.    Venora Maples, MD/MPH 12/29/20 10:23 AM  Allergies as of 12/29/2020       Reactions   Effexor [venlafaxine] Other (See Comments)   Shaky    Other Hives   DAIRY and WHEAT.  REACTION HIVES AND ITCHING.   Shellfish Allergy Itching, Swelling        Medication List     TAKE these medications    albuterol (2.5 MG/3ML) 0.083% nebulizer solution Commonly known as: PROVENTIL Take 3 mLs (2.5 mg total) by nebulization every 6 (six) hours as needed for wheezing or shortness of breath.   albuterol 108 (90 Base) MCG/ACT inhaler Commonly known as: VENTOLIN HFA Inhale 2 puffs every 4 hours as needed for cough, wheeze, tightness in chest, or shortness of breath.   budesonide 32 MCG/ACT nasal spray Commonly known as: RHINOCORT AQUA 1 spray in each nostril once a day as needed for stuffy nose.   budesonide-formoterol 160-4.5 MCG/ACT inhaler Commonly known as: Symbicort TAKE 2 PUFFS BY MOUTH TWICE A DAY What changed:  how much to take how to take this when to take this additional instructions   budesonide-formoterol 160-4.5 MCG/ACT inhaler Commonly known as: Symbicort Inhale 2 puffs twice a day with a spacer to prevent cough  or wheeze. Rinse mouth after use. What changed: Another medication with the same name was changed. Make sure you understand how and when to take each.   cetirizine 10 MG tablet Commonly known as: ZYRTEC Take 1 tablet (10 mg total) by mouth daily.   cyclobenzaprine 5 MG tablet Commonly known as: FLEXERIL Take 1 tablet (5 mg total) by mouth 3 (three) times daily as needed for muscle spasms.   docusate sodium 250 MG capsule Commonly known as: COLACE Take 1 capsule (250 mg total) by mouth daily.   Prenatal Complete 14-0.4 MG Tabs Take 1 tablet by mouth daily.   [facility] Prenatal 27-0.8 MG Tabs Take 1 tablet by mouth daily.   valACYclovir 1000 MG tablet Commonly known as: Valtrex Take 1 tablet (1,000 mg total) by mouth 2 (two) times daily. Needs yearly appointment

## 2021-01-10 ENCOUNTER — Other Ambulatory Visit: Payer: Self-pay

## 2021-01-10 ENCOUNTER — Encounter: Payer: Self-pay | Admitting: Nurse Practitioner

## 2021-01-10 ENCOUNTER — Ambulatory Visit (INDEPENDENT_AMBULATORY_CARE_PROVIDER_SITE_OTHER): Payer: 59 | Admitting: Nurse Practitioner

## 2021-01-10 VITALS — BP 104/68 | HR 88 | Temp 98.5°F | Ht 60.0 in | Wt 191.8 lb

## 2021-01-10 DIAGNOSIS — Z862 Personal history of diseases of the blood and blood-forming organs and certain disorders involving the immune mechanism: Secondary | ICD-10-CM | POA: Insufficient documentation

## 2021-01-10 DIAGNOSIS — H6983 Other specified disorders of Eustachian tube, bilateral: Secondary | ICD-10-CM

## 2021-01-10 MED ORDER — FLUTICASONE PROPIONATE 50 MCG/ACT NA SUSP: 1.0000 | Freq: Two times a day (BID) | NASAL | 2 refills | Status: DC

## 2021-01-10 MED ORDER — CETIRIZINE HCL 10 MG PO TABS: 10.0000 mg | ORAL_TABLET | Freq: Every day | ORAL | 5 refills | Status: DC

## 2021-01-10 NOTE — Patient Instructions (Signed)
Eustachian Tube Dysfunction  Eustachian tube dysfunction refers to a condition in which a blockage develops in the narrow passage that connects the middle ear to the back of the nose (eustachian tube). The eustachian tube regulates air pressure in the middle ear by letting air move between the ear and nose. It also helps to drain fluid from the middle earspace. Eustachian tube dysfunction can affect one or both ears. When the eustachian tube does not function properly, air pressure, fluid, or both can build up inthe middle ear. What are the causes? This condition occurs when the eustachian tube becomes blocked or cannot open normally. Common causes of this condition include: Ear infections. Colds and other infections that affect the nose, mouth, and throat (upper respiratory tract). Allergies. Irritation from cigarette smoke. Irritation from stomach acid coming up into the esophagus (gastroesophageal reflux). The esophagus is the tube that carries food from the mouth to the stomach. Sudden changes in air pressure, such as from descending in an airplane or scuba diving. Abnormal growths in the nose or throat, such as: Growths that line the nose (nasal polyps). Abnormal growth of cells (tumors). Enlarged tissue at the back of the throat (adenoids). What increases the risk? You are more likely to develop this condition if: You smoke. You are overweight. You are a child who has: Certain birth defects of the mouth, such as cleft palate. Large tonsils or adenoids. What are the signs or symptoms? Common symptoms of this condition include: A feeling of fullness in the ear. Ear pain. Clicking or popping noises in the ear. Ringing in the ear. Hearing loss. Loss of balance. Dizziness. Symptoms may get worse when the air pressure around you changes, such as whenyou travel to an area of high elevation, fly on an airplane, or go scuba diving. How is this diagnosed? This condition may be diagnosed  based on: Your symptoms. A physical exam of your ears, nose, and throat. Tests, such as those that measure: The movement of your eardrum (tympanogram). Your hearing (audiometry). How is this treated? Treatment depends on the cause and severity of your condition. In mild cases, you may relieve your symptoms by moving air into your ears. This is called "popping the ears." In more severe cases, or if you have symptoms of fluid in your ears, treatment may include: Medicines to relieve congestion (decongestants). Medicines that treat allergies (antihistamines). Nasal sprays or ear drops that contain medicines that reduce swelling (steroids). A procedure to drain the fluid in your eardrum (myringotomy). In this procedure, a small tube is placed in the eardrum to: Drain the fluid. Restore the air in the middle ear space. A procedure to insert a balloon device through the nose to inflate the opening of the eustachian tube (balloon dilation). Follow these instructions at home: Lifestyle Do not do any of the following until your health care provider approves: Travel to high altitudes. Fly in airplanes. Work in a pressurized cabin or room. Scuba dive. Do not use any products that contain nicotine or tobacco, such as cigarettes and e-cigarettes. If you need help quitting, ask your health care provider. Keep your ears dry. Wear fitted earplugs during showering and bathing. Dry your ears completely after. General instructions Take over-the-counter and prescription medicines only as told by your health care provider. Use techniques to help pop your ears as recommended by your health care provider. These may include: Chewing gum. Yawning. Frequent, forceful swallowing. Closing your mouth, holding your nose closed, and gently blowing as if you   are trying to blow air out of your nose. Keep all follow-up visits as told by your health care provider. This is important. Contact a health care provider  if: Your symptoms do not go away after treatment. Your symptoms come back after treatment. You are unable to pop your ears. You have: A fever. Pain in your ear. Pain in your head or neck. Fluid draining from your ear. Your hearing suddenly changes. You become very dizzy. You lose your balance. Summary Eustachian tube dysfunction refers to a condition in which a blockage develops in the eustachian tube. It can be caused by ear infections, allergies, inhaled irritants, or abnormal growths in the nose or throat. Symptoms include ear pain, hearing loss, or ringing in the ears. Mild cases are treated with maneuvers to unblock the ears, such as yawning or ear popping. Severe cases are treated with medicines. Surgery may also be done (rare). This information is not intended to replace advice given to you by your health care provider. Make sure you discuss any questions you have with your healthcare provider. Document Revised: 08/20/2017 Document Reviewed: 08/20/2017 Elsevier Patient Education  2022 [employer]  

## 2021-01-10 NOTE — Progress Notes (Signed)
Subjective:  Patient ID: Danielle Velez, female    DOB: [birth year]  Age: 30 y.o. MRN: [id number]  CC: Acute Visit (Pt c/o thumping sound and pain in her left ear x 2 weeks. Pt has not tried any otc drops or medication for this. )  Ear Fullness  There is pain in both ears. This is a new problem. The current episode started 1 to 4 weeks ago. The problem occurs every few hours. The problem has been waxing and waning. There has been no fever. Pertinent negatives include no abdominal pain, coughing, diarrhea, ear discharge, headaches, hearing loss, neck pain, rash, rhinorrhea, sore throat or vomiting. She has tried nothing for the symptoms.   Reviewed past Medical, Social and Family history today.  Outpatient Medications Prior to Visit  Medication Sig Dispense Refill   albuterol (PROVENTIL) (2.5 MG/3ML) 0.083% nebulizer solution Take 3 mLs (2.5 mg total) by nebulization every 6 (six) hours as needed for wheezing or shortness of breath. 75 mL 0   albuterol (VENTOLIN HFA) 108 (90 Base) MCG/ACT inhaler Inhale 2 puffs every 4 hours as needed for cough, wheeze, tightness in chest, or shortness of breath. 18 g 1   budesonide-formoterol (SYMBICORT) 160-4.5 MCG/ACT inhaler Inhale 2 puffs twice a day with a spacer to prevent cough or wheeze. Rinse mouth after use. 10.2 g 1   Prenatal Vit-Fe Fumarate-FA ([facility] PRENATAL) 27-0.8 MG TABS Take 1 tablet by mouth daily.     budesonide-formoterol (SYMBICORT) 160-4.5 MCG/ACT inhaler TAKE 2 PUFFS BY MOUTH TWICE A DAY (Patient taking differently: Inhale 2 puffs into the lungs in the morning and at bedtime.) 30.6 g 5   cetirizine (ZYRTEC) 10 MG tablet Take 1 tablet (10 mg total) by mouth daily. 30 tablet 5   docusate sodium (COLACE) 100 MG capsule Take 100 mg by mouth 2 (two) times daily as needed.     EPINEPHrine 0.3 mg/0.3 mL IJ SOAJ injection Inject 1 mL into the muscle as needed.     sertraline (ZOLOFT) 50 MG tablet Take 50 mg by mouth daily.     budesonide (RHINOCORT  AQUA) 32 MCG/ACT nasal spray 1 spray in each nostril once a day as needed for stuffy nose. (Patient not taking: Reported on 01/10/2021) 8.43 mL 2   cyclobenzaprine (FLEXERIL) 5 MG tablet Take 1 tablet (5 mg total) by mouth 3 (three) times daily as needed for muscle spasms. (Patient not taking: Reported on 01/10/2021) 10 tablet 0   docusate sodium (COLACE) 250 MG capsule Take 1 capsule (250 mg total) by mouth daily. (Patient not taking: Reported on 01/10/2021) 30 capsule 1   Prenatal Vit-Fe Fumarate-FA (PRENATAL COMPLETE) 14-0.4 MG TABS Take 1 tablet by mouth daily. (Patient not taking: Reported on 01/10/2021) 60 tablet 0   valACYclovir (VALTREX) 1000 MG tablet Take 1 tablet (1,000 mg total) by mouth 2 (two) times daily. Needs yearly appointment (Patient not taking: Reported on 01/10/2021) 10 tablet 0   No facility-administered medications prior to visit.    ROS See HPI  Objective:  BP 104/68 (BP Location: Left Arm, Patient Position: Sitting, Cuff Size: Normal)   Pulse 88   Temp 98.5 F (36.9 C) (Temporal)   Ht 5' (1.524 m)   Wt 191 lb 12.8 oz (87 kg)   LMP 08/09/2020   SpO2 98%   BMI 37.46 kg/m   Physical Exam Vitals reviewed.  HENT:     Right Ear: Hearing, ear canal and external ear normal. No swelling. A middle ear effusion is present.  There is no impacted cerumen. No mastoid tenderness. Tympanic membrane is not injected, scarred, perforated, erythematous, retracted or bulging.     Left Ear: Hearing, ear canal and external ear normal. No swelling. A middle ear effusion is present. There is no impacted cerumen. No mastoid tenderness. Tympanic membrane is not injected, scarred, perforated, erythematous, retracted or bulging.  Cardiovascular:     Rate and Rhythm: Normal rate.     Pulses: Normal pulses.  Pulmonary:     Effort: Pulmonary effort is normal.  Musculoskeletal:     Cervical back: Normal range of motion and neck supple. No rigidity or tenderness.     Right lower leg: No edema.      Left lower leg: No edema.  Lymphadenopathy:     Cervical: No cervical adenopathy.  Neurological:     Mental Status: She is alert and oriented to person, place, and time.   Assessment & Plan:  This visit occurred during the SARS-CoV-2 public health emergency.  Safety protocols were in place, including screening questions prior to the visit, additional usage of staff PPE, and extensive cleaning of exam room while observing appropriate contact time as indicated for disinfecting solutions.   Danielle Velez was seen today for acute visit.  Diagnoses and all orders for this visit:  Eustachian tube dysfunction, bilateral -     cetirizine (ZYRTEC) 10 MG tablet; Take 1 tablet (10 mg total) by mouth daily. -     fluticasone (FLONASE) 50 MCG/ACT nasal spray; Place 1 spray into both nostrils in the morning and at bedtime.   Problem List Items Addressed This Visit   None Visit Diagnoses     Eustachian tube dysfunction, bilateral    -  Primary   Relevant Medications   cetirizine (ZYRTEC) 10 MG tablet   fluticasone (FLONASE) 50 MCG/ACT nasal spray       Follow-up: No follow-ups on file.  Alysia Penna, NP

## 2021-01-25 ENCOUNTER — Telehealth: Payer: Self-pay | Admitting: Allergy & Immunology

## 2021-01-25 MED ORDER — ALBUTEROL SULFATE HFA 108 (90 BASE) MCG/ACT IN AERS: INHALATION_SPRAY | RESPIRATORY_TRACT | 1 refills | Status: DC

## 2021-01-25 MED ORDER — BUDESONIDE-FORMOTEROL FUMARATE 160-4.5 MCG/ACT IN AERO: INHALATION_SPRAY | RESPIRATORY_TRACT | 1 refills | Status: DC

## 2021-01-25 NOTE — Telephone Encounter (Signed)
Patient made an appointment for 02/16/21, with Dr. Dellis Anes. She is requesting a prescription for [employer]. She said she is pregnant and is having quite a few asthma attacks. [facility] [address]

## 2021-01-25 NOTE — Telephone Encounter (Signed)
I sent in albuterol.  I am also refilling her Symbicort.  We definitely need to keep her breathing in check while she is pregnant.  She has not been using the Symbicort. She has been out of it.   She is [redacted] weeks pregnant.   Malachi Bonds, MD [facility]

## 2021-01-25 NOTE — Telephone Encounter (Signed)
Please advise to albuterol as pt is pregnant

## 2021-02-16 ENCOUNTER — Other Ambulatory Visit: Payer: Self-pay

## 2021-02-16 ENCOUNTER — Encounter: Payer: Self-pay | Admitting: Allergy & Immunology

## 2021-02-16 ENCOUNTER — Ambulatory Visit (INDEPENDENT_AMBULATORY_CARE_PROVIDER_SITE_OTHER): Payer: 59 | Admitting: Allergy & Immunology

## 2021-02-16 VITALS — BP 120/74 | HR 80 | Temp 98.3°F | Ht 60.0 in | Wt 199.2 lb

## 2021-02-16 DIAGNOSIS — L501 Idiopathic urticaria: Secondary | ICD-10-CM | POA: Diagnosis not present

## 2021-02-16 DIAGNOSIS — J302 Other seasonal allergic rhinitis: Secondary | ICD-10-CM

## 2021-02-16 DIAGNOSIS — H6983 Other specified disorders of Eustachian tube, bilateral: Secondary | ICD-10-CM

## 2021-02-16 DIAGNOSIS — H6993 Unspecified Eustachian tube disorder, bilateral: Secondary | ICD-10-CM

## 2021-02-16 DIAGNOSIS — T7800XA Anaphylactic reaction due to unspecified food, initial encounter: Secondary | ICD-10-CM

## 2021-02-16 DIAGNOSIS — J45909 Unspecified asthma, uncomplicated: Secondary | ICD-10-CM

## 2021-02-16 DIAGNOSIS — J3089 Other allergic rhinitis: Secondary | ICD-10-CM

## 2021-02-16 DIAGNOSIS — O99513 Diseases of the respiratory system complicating pregnancy, third trimester: Secondary | ICD-10-CM | POA: Diagnosis not present

## 2021-02-16 MED ORDER — CETIRIZINE HCL 10 MG PO TABS: 10.0000 mg | ORAL_TABLET | Freq: Every day | ORAL | 5 refills | Status: DC

## 2021-02-16 MED ORDER — ALBUTEROL SULFATE (2.5 MG/3ML) 0.083% IN NEBU: 2.5000 mg | INHALATION_SOLUTION | Freq: Four times a day (QID) | RESPIRATORY_TRACT | 0 refills | Status: DC | PRN

## 2021-02-16 MED ORDER — BUDESONIDE 32 MCG/ACT NA SUSP: 2.0000 | Freq: Every day | NASAL | 5 refills | Status: DC

## 2021-02-16 NOTE — Progress Notes (Signed)
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 Moderate persistent asthma with recurrent episodes of pleurisy - Spirometry looked lackluster and has looked better in the past. - Nebulizer provided today (try to use only as needed to make sure that albuterol remains effective over the long term).  - Daily controller medication(s): Symbicort 160/4.5 two puffs twice daily with spacer - Rescue medications: ProAir 4 puffs every 4-6 hours as needed or albuterol nebulizer one vial puffs every 4-6 hours as needed - Asthma control goals:  * Full participation in all desired activities (may need albuterol before activity) * Albuterol use two time or less a week on average (not counting use with activity) * Cough interfering with sleep two time or less a month * Oral steroids no more than once a year * No hospitalizations  3. Allergic rhinitis - Continue with Zyrtec (cetirizine) 10mg  daily. - Restart Rhinocort one spray per nostril daily (this is class B during pregnancy).   4. Food allergies (shellfish, wheat, and cow's milk) - EpiPen is up to date.  - Continue to avoid your triggering foods.   5. Return in about 6 weeks (around 03/30/2021).     Subjective:   Danielle Velez is a 30 y.o. female presenting today for follow up of  Chief Complaint  Patient presents with   Urticaria   Asthma   Follow-up    Patient in today for recurrent hives in the past 3 months.    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  Danielle Velez is a 30 y.o. female presenting for a follow up visit.  She was last seen by me in October 2021.  In the interim, she had 2 nurse practitioner visits, the last one in May 2022.  In May, she was [redacted] weeks pregnant.  Her Symbicort was increased to 2 puffs twice daily due to lack of control.  For her allergic rhinitis, she was continued on Zyrtec daily and started on Rhinocort 1 spray per nostril daily.  She continues to avoid shellfish as well as wheat and cows milk.  Her urticaria was controlled with Zyrtec daily.  Asthma/Respiratory Symptom History: She has been using her Symbicort fairly  frequently. She has been using this two puffs twice daily. She has been using her rescue inhaler 3-4 times per day. She does get relief from her albuterol. Relief lasts for approximately the rest of the day unless she is moving around her house and is being active. She does have a spacer for her Symbicort.  Of note, her asthma became poorly controlled when she was pregnant with her now  29 year old.  This was when she lived in [place].  Allergic Rhinitis Symptom History: Her nasal allergies are not under good control. She has a lot of mucous production that she has to get rid of of. Her allergies are fairly terrible.   Food Allergy Symptom History: She continues to avoid shellfish.  She has had no accidental exposures.  EpiPen is up-to-date.  Skin Symptom History: Hives have started back again. The beginning of the 2nd trimester was fine, but they hives  came back during the first trimester.   She has been using Tylenol 2 tablets as needed for pain. She is also using this for her pleurisy.  She does report some current chest pain in her midline as well as her left costophrenic angle.  This is typically where her pleurisy pops out.  Pain has been consistent since being pregnant.  It is not very debilitating.  GERD Symptom History: She is trying to use ginger tear to treat her reflux from her pregnancy.  She is not interested in Pepcid.  She is working from home. She will have 16 weeks off from her job after she has birth. Her son is 71 years old. She has no baby stuff at this point.  The due date is January 2.  Otherwise, there have been no changes to her past medical history, surgical history, family history, or social history.    Review of Systems  Constitutional: Negative.  Negative for chills, fever, malaise/fatigue and weight loss.  HENT:  Positive for congestion. Negative for ear discharge and ear pain.   Eyes:  Negative for pain, discharge and redness.  Respiratory:  Positive for shortness of breath and wheezing. Negative for cough and sputum production.   Cardiovascular: Negative.  Negative for chest pain and palpitations.  Gastrointestinal:  Positive for heartburn. Negative for abdominal pain.  Skin: Negative.  Negative for itching and rash.  Neurological:  Negative for dizziness and headaches.  Endo/Heme/Allergies:  Negative for environmental allergies. Does not bruise/bleed easily.      Objective:   Blood pressure 120/74, pulse 80, temperature 98.3 F (36.8 C), temperature source Temporal, height 5' (1.524 m), weight 199 lb 3.2 oz (90.4 kg), last menstrual period 08/09/2020, SpO2 98 %. Body mass index is 38.9 kg/m.   Physical Exam:  Physical Exam Constitutional:      Appearance: She is well-developed.     Comments: Pregnant.  HENT:     Head: Normocephalic and atraumatic.     Right Ear: Tympanic membrane, ear canal  and external ear normal.     Left Ear: Tympanic membrane, ear canal and external ear normal.     Nose: Rhinorrhea present. No nasal deformity, septal deviation or mucosal edema.     Right Turbinates: Enlarged and swollen.     Left Turbinates: Enlarged and swollen.     Right Sinus: No maxillary sinus tenderness or frontal sinus tenderness.     Left Sinus: No maxillary sinus tenderness or frontal sinus tenderness.     Mouth/Throat:     Mouth: Mucous membranes are not pale and not dry.     Pharynx: Uvula midline.  Eyes:     General: Lids are normal. Allergic shiner present.        Right eye: No discharge.        Left eye: No discharge.     Conjunctiva/sclera: Conjunctivae normal.     Right eye: Right conjunctiva is not injected. No chemosis.    Left eye: Left conjunctiva is not  injected. No chemosis.    Pupils: Pupils are equal, round, and reactive to light.  Cardiovascular:     Rate and Rhythm: Normal rate and regular rhythm.     Heart sounds: Normal heart sounds.  Pulmonary:     Effort: Pulmonary effort is normal. No tachypnea, accessory muscle usage or respiratory distress.     Breath sounds: Normal breath sounds. No wheezing, rhonchi or rales.     Comments: Moving air well in all lung fields. Chest:     Chest wall: No tenderness.  Lymphadenopathy:     Cervical: No cervical adenopathy.  Skin:    General: Skin is warm.     Capillary Refill: Capillary refill takes less than 2 seconds.     Coloration: Skin is not pale.     Findings: No abrasion, erythema, petechiae or rash. Rash is not papular, urticarial or vesicular.     Comments: No eczematous or urticarial lesions noted.  Neurological:     Mental Status: She is alert.  Psychiatric:        Behavior: Behavior is cooperative.     Diagnostic studies:   Spirometry: results abnormal (FEV1: 1.57/65%, FVC: 1.96/71%, FEV1/FVC: 80%).    Spirometry consistent with possible restrictive disease.   Allergy Studies: none        Malachi Bonds, MD  [facility]

## 2021-02-16 NOTE — Patient Instructions (Addendum)
1. Moderate persistent asthma with recurrent episodes of pleurisy - Spirometry looked lackluster and has looked better in the past. - Nebulizer provided today (try to use only as needed to make sure that albuterol remains effective over the long term).  - Daily controller medication(s): Symbicort 160/4.5 two puffs twice daily with spacer - Rescue medications: ProAir 4 puffs every 4-6 hours as needed or albuterol nebulizer one vial puffs every 4-6 hours as needed - Asthma control goals:  * Full participation in all desired activities (may need albuterol before activity) * Albuterol use two time or less a week on average (not counting use with activity) * Cough interfering with sleep two time or less a month * Oral steroids no more than once a year * No hospitalizations  3. Allergic rhinitis - Continue with Zyrtec (cetirizine) 10mg  daily. - Restart Rhinocort one spray per nostril daily (this is class B during pregnancy).   4. Food allergies (shellfish, wheat, and cow's milk) - EpiPen is up to date.  - Continue to avoid your triggering foods.   5. Return in about 6 weeks (around 03/30/2021).    Please inform 04/01/2021 of any Emergency Department visits, hospitalizations, or changes in symptoms. Call us before going to the ED for breathing or allergy symptoms since we might be able to fit you in for a sick visit. Feel free to contact us anytime with any questions, problems, or concerns.  It was a pleasure to see you again today!  Websites that have reliable patient information: 1. American Academy of Asthma, Allergy, and Immunology: www.aaaai.org 2. Food Allergy Research and Education (FARE): foodallergy.org 3. Mothers of Asthmatics: http://www.asthmacommunitynetwork.org 4. American College of Allergy, Asthma, and Immunology: www.acaai.org   COVID-19 Vaccine Information can be found at: [place] For questions related to  vaccine distribution or appointments, please email [email or web address] or call [phone].     "Like" [phone] on Facebook and Instagram for our latest updates!     HAPPY FALL!     Make sure you are registered to vote! If you have moved or changed any of your contact information, you will need to get this updated before voting!  In some cases, you MAY be able to register to vote online: [place]

## 2021-02-20 ENCOUNTER — Inpatient Hospital Stay (HOSPITAL_COMMUNITY)
Admission: AD | Admit: 2021-02-20 | Discharge: 2021-02-20 | Disposition: A | Discharge status: Home / Self Care | Payer: 59 | Attending: Obstetrics and Gynecology | Admitting: Obstetrics and Gynecology

## 2021-02-20 ENCOUNTER — Other Ambulatory Visit: Payer: Self-pay

## 2021-02-20 ENCOUNTER — Encounter (HOSPITAL_COMMUNITY): Payer: Self-pay | Admitting: Obstetrics and Gynecology

## 2021-02-20 DIAGNOSIS — Z3A28 28 weeks gestation of pregnancy: Secondary | ICD-10-CM | POA: Diagnosis not present

## 2021-02-20 DIAGNOSIS — O99893 Other specified diseases and conditions complicating puerperium: Secondary | ICD-10-CM | POA: Insufficient documentation

## 2021-02-20 DIAGNOSIS — K429 Umbilical hernia without obstruction or gangrene: Secondary | ICD-10-CM | POA: Insufficient documentation

## 2021-02-20 DIAGNOSIS — O99613 Diseases of the digestive system complicating pregnancy, third trimester: Secondary | ICD-10-CM | POA: Diagnosis not present

## 2021-02-20 DIAGNOSIS — R1033 Periumbilical pain: Secondary | ICD-10-CM | POA: Diagnosis present

## 2021-02-20 LAB — URINALYSIS, ROUTINE W REFLEX MICROSCOPIC
Bilirubin Urine: NEGATIVE
Glucose, UA: NEGATIVE mg/dL
Hgb urine dipstick: NEGATIVE
Ketones, ur: NEGATIVE mg/dL
Leukocytes,Ua: NEGATIVE
Nitrite: NEGATIVE
Protein, ur: NEGATIVE mg/dL
Specific Gravity, Urine: 1.014 (ref 1.005–1.030)
pH: 5 (ref 5.0–8.0)

## 2021-02-20 NOTE — MAU Note (Signed)
Pain and swelling her her umbilicus-

## 2021-02-20 NOTE — MAU Provider Note (Signed)
History     CSN: [id number]  Arrival date and time: 02/20/21 4098   Event Date/Time   First Provider Initiated Contact with Patient 02/20/21 0134      Chief Complaint  Patient presents with   Abdominal Pain   HPI Danielle Velez is a 30 y.o. G3P1011 at [redacted]w[redacted]d who presents with umbilical pain.  Symptoms started 3 days ago but worsened tonight.  Reports pain around her umbilicus that occurs when things touch it.  Took a picture of her abdomen and noticed that her umbilicus was swollen.  Denies fever, vomiting, contractions, dysuria, vaginal bleeding, or leaking of fluid.  Last bowel movement was 2 days ago.  Has had issues with constipation with the pregnancy but is no longer taking medications because up until recently it had improved. Reports good fetal movement.  Sees Dr. Mindi Slicker on Tuesday.  OB History     Gravida  3   Para  1   Term  1   Preterm      AB  1   Living  1      SAB  1   IAB      Ectopic      Multiple      Live Births  1           Past Medical History:  Diagnosis Date   Angio-edema    Asthma    Food allergy 03/22/2015   Moderate persistent asthma with acute exacerbation 03/09/2015   Recurrent pleurisy 07/19/2016   Recurrent upper respiratory infection (URI)    Urticaria     Past Surgical History:  Procedure Laterality Date   CESAREAN SECTION     CESAREAN SECTION  09/16/2010    Family History  Problem Relation Age of Onset   Asthma Maternal Grandmother    Lupus Mother    Healthy Father    Allergic rhinitis Neg Hx    Angioedema Neg Hx    Atopy Neg Hx    Eczema Neg Hx    Immunodeficiency Neg Hx    Urticaria Neg Hx     Social History   Tobacco Use   Smoking status: Never   Smokeless tobacco: Never  Vaping Use   Vaping Use: Never used  Substance Use Topics   Alcohol use: No    Alcohol/week: 0.0 standard drinks   Drug use: No    Allergies:  Allergies  Allergen Reactions   Effexor [Venlafaxine] Other (See Comments)     Shaky    Other Hives    DAIRY and WHEAT.  REACTION HIVES AND ITCHING.   Shellfish Allergy Itching and Swelling    No medications prior to admission.    Review of Systems  Constitutional: Negative.   Gastrointestinal:  Positive for abdominal pain and constipation. Negative for diarrhea, nausea and vomiting.  Genitourinary: Negative.   Physical Exam   Blood pressure (!) 106/51, pulse 71, last menstrual period 08/09/2020, SpO2 99 %.  Physical Exam Vitals and nursing note reviewed.  Constitutional:      Appearance: She is well-developed. She is not ill-appearing.  HENT:     Head: Normocephalic and atraumatic.  Eyes:     General: No scleral icterus. Pulmonary:     Effort: Pulmonary effort is normal. No respiratory distress.  Abdominal:     General: Bowel sounds are normal.     Palpations: Abdomen is soft.     Tenderness: There is abdominal tenderness in the periumbilical area.     Hernia: A hernia is  present. Hernia is present in the umbilical area (~3 cm umbilical hernia, reducable).     Comments: gravid  Skin:    General: Skin is warm and dry.  Neurological:     General: No focal deficit present.     Mental Status: She is alert.  Psychiatric:        Mood and Affect: Mood normal.        Behavior: Behavior normal.   NST:  Baseline: 140 bpm, Variability: Good {> 6 bpm), Accelerations: Reactive, and Decelerations: Absent  MAU Course  Procedures  MDM Patient with umbilical hernia. No evidence of incarceration. She is afebrile. Fetal tracing appropriate for gestational age. No ob complaints. Has ob f/u appt on Tuesday.   Assessment and Plan   1. Umbilical hernia without obstruction and without gangrene   2. [redacted] weeks gestation of pregnancy    -reviewed reasons to return to MAU -keep f/u with Dr. Lynne Leader 02/20/2021, 2:55 AM

## 2021-02-22 ENCOUNTER — Other Ambulatory Visit: Payer: Self-pay

## 2021-02-25 ENCOUNTER — Encounter (HOSPITAL_COMMUNITY): Payer: Self-pay | Admitting: Obstetrics and Gynecology

## 2021-02-25 ENCOUNTER — Observation Stay (HOSPITAL_COMMUNITY)
Admission: AD | Admit: 2021-02-25 | Discharge: 2021-02-27 | Disposition: A | Discharge status: Home / Self Care | Payer: 59 | Attending: Obstetrics and Gynecology | Admitting: Obstetrics and Gynecology

## 2021-02-25 ENCOUNTER — Inpatient Hospital Stay (HOSPITAL_COMMUNITY): Payer: 59

## 2021-02-25 ENCOUNTER — Other Ambulatory Visit: Payer: Self-pay

## 2021-02-25 ENCOUNTER — Inpatient Hospital Stay (HOSPITAL_BASED_OUTPATIENT_CLINIC_OR_DEPARTMENT_OTHER): Payer: 59

## 2021-02-25 DIAGNOSIS — O9A213 Injury, poisoning and certain other consequences of external causes complicating pregnancy, third trimester: Secondary | ICD-10-CM | POA: Diagnosis not present

## 2021-02-25 DIAGNOSIS — O99891 Other specified diseases and conditions complicating pregnancy: Secondary | ICD-10-CM

## 2021-02-25 DIAGNOSIS — Z20822 Contact with and (suspected) exposure to covid-19: Secondary | ICD-10-CM | POA: Diagnosis not present

## 2021-02-25 DIAGNOSIS — W19XXXA Unspecified fall, initial encounter: Secondary | ICD-10-CM

## 2021-02-25 DIAGNOSIS — R109 Unspecified abdominal pain: Secondary | ICD-10-CM

## 2021-02-25 DIAGNOSIS — R569 Unspecified convulsions: Secondary | ICD-10-CM | POA: Insufficient documentation

## 2021-02-25 DIAGNOSIS — Z3A28 28 weeks gestation of pregnancy: Secondary | ICD-10-CM

## 2021-02-25 DIAGNOSIS — O368130 Decreased fetal movements, third trimester, not applicable or unspecified: Secondary | ICD-10-CM | POA: Diagnosis not present

## 2021-02-25 DIAGNOSIS — S3991XA Unspecified injury of abdomen, initial encounter: Secondary | ICD-10-CM

## 2021-02-25 DIAGNOSIS — O99513 Diseases of the respiratory system complicating pregnancy, third trimester: Secondary | ICD-10-CM | POA: Diagnosis not present

## 2021-02-25 DIAGNOSIS — O26893 Other specified pregnancy related conditions, third trimester: Secondary | ICD-10-CM | POA: Insufficient documentation

## 2021-02-25 DIAGNOSIS — J45909 Unspecified asthma, uncomplicated: Secondary | ICD-10-CM | POA: Diagnosis not present

## 2021-02-25 HISTORY — DX: Unspecified injury of abdomen, initial encounter: S39.91XA

## 2021-02-25 LAB — URINALYSIS, ROUTINE W REFLEX MICROSCOPIC
Bilirubin Urine: NEGATIVE
Glucose, UA: NEGATIVE mg/dL
Hgb urine dipstick: NEGATIVE
Ketones, ur: NEGATIVE mg/dL
Leukocytes,Ua: NEGATIVE
Nitrite: NEGATIVE
Protein, ur: NEGATIVE mg/dL
Specific Gravity, Urine: 1.015 (ref 1.005–1.030)
pH: 6 (ref 5.0–8.0)

## 2021-02-25 LAB — COMPREHENSIVE METABOLIC PANEL
ALT: 10 U/L (ref 0–44)
AST: 19 U/L (ref 15–41)
Albumin: 2.8 g/dL — ABNORMAL LOW (ref 3.5–5.0)
Alkaline Phosphatase: 79 U/L (ref 38–126)
Anion gap: 9 (ref 5–15)
BUN: 7 mg/dL (ref 6–20)
CO2: 21 mmol/L — ABNORMAL LOW (ref 22–32)
Calcium: 8.7 mg/dL — ABNORMAL LOW (ref 8.9–10.3)
Chloride: 105 mmol/L (ref 98–111)
Creatinine, Ser: 0.59 mg/dL (ref 0.44–1.00)
GFR, Estimated: >60 mL/min (ref >60)
Glucose, Bld: 71 mg/dL (ref 70–99)
Potassium: 3.9 mmol/L (ref 3.5–5.1)
Sodium: 135 mmol/L (ref 135–145)
Total Bilirubin: 0.3 mg/dL (ref 0.3–1.2)
Total Protein: 6.4 g/dL — ABNORMAL LOW (ref 6.5–8.1)

## 2021-02-25 LAB — CBC
HCT: 32.3 % — ABNORMAL LOW (ref 36.0–46.0)
Hemoglobin: 10.7 g/dL — ABNORMAL LOW (ref 12.0–15.0)
MCH: 27.5 pg (ref 26.0–34.0)
MCHC: 33.1 g/dL (ref 30.0–36.0)
MCV: 83 fL (ref 80.0–100.0)
Platelets: 212 10*3/uL (ref 150–400)
RBC: 3.89 MIL/uL (ref 3.87–5.11)
RDW: 13.1 % (ref 11.5–15.5)
WBC: 8 10*3/uL (ref 4.0–10.5)
nRBC: 0 % (ref 0.0–0.2)

## 2021-02-25 LAB — RESP PANEL BY RT-PCR (FLU A&B, COVID) ARPGX2
Influenza A by PCR: NEGATIVE
Influenza B by PCR: NEGATIVE
SARS Coronavirus 2 by RT PCR: NEGATIVE

## 2021-02-25 LAB — TYPE AND SCREEN
ABO/RH(D): B POS
Antibody Screen: NEGATIVE

## 2021-02-25 MED ORDER — CALCIUM CARBONATE ANTACID 500 MG PO CHEW: 2.0000 | CHEWABLE_TABLET | ORAL | Status: DC | PRN

## 2021-02-25 MED ORDER — ZOLPIDEM TARTRATE 5 MG PO TABS: 5.0000 mg | ORAL_TABLET | Freq: Every evening | ORAL | Status: DC | PRN

## 2021-02-25 MED ORDER — ALBUTEROL SULFATE (2.5 MG/3ML) 0.083% IN NEBU
2.5000 mg | INHALATION_SOLUTION | Freq: Four times a day (QID) | RESPIRATORY_TRACT | Status: DC | PRN
  Administered 2021-02-26: 2.5 mg via RESPIRATORY_TRACT
  Filled 2021-02-25: qty 3

## 2021-02-25 MED ORDER — LACTATED RINGERS IV SOLN: INTRAVENOUS | Status: DC

## 2021-02-25 MED ORDER — DOCUSATE SODIUM 100 MG PO CAPS
100.0000 mg | ORAL_CAPSULE | Freq: Every day | ORAL | Status: DC
  Administered 2021-02-25 – 2021-02-27 (×2): 100 mg via ORAL
  Filled 2021-02-25 (×4): qty 1

## 2021-02-25 MED ORDER — FLUTICASONE PROPIONATE 50 MCG/ACT NA SUSP
2.0000 | Freq: Every day | NASAL | Status: DC
  Administered 2021-02-25 – 2021-02-27 (×3): 2 via NASAL
  Filled 2021-02-25: qty 16

## 2021-02-25 MED ORDER — PRENATAL MULTIVITAMIN CH
1.0000 | ORAL_TABLET | Freq: Every day | ORAL | Status: DC
  Administered 2021-02-26 – 2021-02-27 (×2): 1 via ORAL
  Filled 2021-02-25 (×3): qty 1

## 2021-02-25 MED ORDER — ALBUTEROL SULFATE HFA 108 (90 BASE) MCG/ACT IN AERS: 2.0000 | INHALATION_SPRAY | RESPIRATORY_TRACT | Status: DC | PRN

## 2021-02-25 MED ORDER — ACETAMINOPHEN 325 MG PO TABS
650.0000 mg | ORAL_TABLET | ORAL | Status: DC | PRN
  Administered 2021-02-25 – 2021-02-26 (×2): 650 mg via ORAL
  Filled 2021-02-25 (×3): qty 2

## 2021-02-25 MED ORDER — LACTATED RINGERS IV BOLUS
1000.0000 mL | Freq: Once | INTRAVENOUS | Status: AC
  Administered 2021-02-25: 1000 mL via INTRAVENOUS

## 2021-02-25 NOTE — Progress Notes (Signed)
Patient s/p MR, normal report. States cramping is subsiding, endorses +FM. No complaints at this time.  Blood pressure 112/71, pulse 76, temperature 98.5 F (36.9 C), temperature source Oral, resp. rate 18, last menstrual period 08/09/2020, SpO2 100 %.

## 2021-02-25 NOTE — MAU Note (Addendum)
...  Danielle Velez is a 30 y.o. at [redacted]w[redacted]d here in MAU reporting: a syncopal episode around 30 minutes ago while standing in line for concessions. She states she has been at her sons football game since 1150 and was standing in the bleachers and began to feel light headed. She states she went to concessions for a gatorade and her next memory is waking up with people standing around her. She is unsure if she hit her stomach or not and states her right knee is scratched up and is hurting. Denies feeling any fetal movement since the occurrence but is now feeling vigorous fetal movement with RN at bedside. Clicker given. She is endorsing lower abdominal pain that is intermittent and rates it 8/10 as well as a HA that she rates 5/10 that wraps around her entire head. No VB or LOF noted in the bathroom here in MAU.   She states she had oatmeal and fruit for breakfast prior to going to the game. She endorses a history of feeling light headed throughout this pregnancy but denies any previous falls. She states she has a history of seizures but has not had a seizure in 13 years.     FHT: 142 initial external Lab orders placed from triage: UA

## 2021-02-25 NOTE — MAU Note (Signed)
Ultrasonographer at bedside. 

## 2021-02-25 NOTE — MAU Provider Note (Signed)
History     CSN: [id number]  Arrival date and time: 02/25/21 1347   Event Date/Time   First Provider Initiated Contact with Patient 02/25/21 1420      Chief Complaint  Patient presents with   Decreased Fetal Movement   Fall   HPI Danielle Velez is a 30 y.o. G3P1011 at [redacted]w[redacted]d who presents via EMS after a fall. She reports she was sitting in the bleachers at her son's football game when she started to feel unwell. She reports feeling like something was wrong and that she was lightheaded. She states she walked to the bathroom and to the concession stand and while she was waiting, she started feeling worse. She sat down and when she got back up, she was still feeling bad. She attempted to walk back to her seat but fell on the way. She states she doesn't remember falling, only waking up surrounded by people and someone holding her up. She is unsure if she fell on her abdomen but reports 8/10 pain in her abdomen and that it is tender to the touch. She reports scrapes on her hands and knees. She also reports a headache that starts in the back of her head and wraps around to the front. She is unsure if she hit her head. She denies any bleeding or leaking. She reports normal fetal movement since her arrival to the MAU.  OB History     Gravida  3   Para  1   Term  1   Preterm      AB  1   Living  1      SAB  1   IAB      Ectopic      Multiple      Live Births  1           Past Medical History:  Diagnosis Date   Angio-edema    Asthma    Food allergy 03/22/2015   Moderate persistent asthma with acute exacerbation 03/09/2015   Recurrent pleurisy 07/19/2016   Recurrent upper respiratory infection (URI)    Urticaria     Past Surgical History:  Procedure Laterality Date   CESAREAN SECTION     CESAREAN SECTION  09/16/2010    Family History  Problem Relation Age of Onset   Asthma Maternal Grandmother    Lupus Mother    Healthy Father    Allergic rhinitis Neg Hx     Angioedema Neg Hx    Atopy Neg Hx    Eczema Neg Hx    Immunodeficiency Neg Hx    Urticaria Neg Hx     Social History   Tobacco Use   Smoking status: Never   Smokeless tobacco: Never  Vaping Use   Vaping Use: Never used  Substance Use Topics   Alcohol use: No    Alcohol/week: 0.0 standard drinks   Drug use: No    Allergies:  Allergies  Allergen Reactions   Effexor [Venlafaxine] Other (See Comments)    Shaky    Other Hives    DAIRY and WHEAT.  REACTION HIVES AND ITCHING.   Shellfish Allergy Itching and Swelling    Medications Prior to Admission  Medication Sig Dispense Refill Last Dose   albuterol (PROVENTIL) (2.5 MG/3ML) 0.083% nebulizer solution Take 3 mLs (2.5 mg total) by nebulization every 6 (six) hours as needed for wheezing or shortness of breath. 75 mL 0 02/24/2021   budesonide-formoterol (SYMBICORT) 160-4.5 MCG/ACT inhaler Inhale 2 puffs twice a day  with a spacer to prevent cough or wheeze. Rinse mouth after use. 10.2 g 1 02/23/2021   Prenatal Vit-Fe Fumarate-FA ([employer] PRENATAL) 27-0.8 MG TABS Take 1 tablet by mouth daily.   02/25/2021   albuterol (VENTOLIN HFA) 108 (90 Base) MCG/ACT inhaler Inhale 2 puffs every 4 hours as needed for cough, wheeze, tightness in chest, or shortness of breath. 18 g 1    budesonide (RHINOCORT ALLERGY) 32 MCG/ACT nasal spray Place 2 sprays into both nostrils daily. 5 mL 5    cetirizine (ZYRTEC) 10 MG tablet Take 1 tablet (10 mg total) by mouth daily. 30 tablet 5    EPINEPHrine 0.3 mg/0.3 mL IJ SOAJ injection Inject 1 mL into the muscle as needed.       Review of Systems  Constitutional: Negative.  Negative for fatigue and fever.  HENT: Negative.    Respiratory: Negative.  Negative for shortness of breath.   Cardiovascular: Negative.  Negative for chest pain.  Gastrointestinal:  Positive for abdominal pain. Negative for constipation, diarrhea, nausea and vomiting.  Genitourinary: Negative.  Negative for dysuria, vaginal bleeding and  vaginal discharge.  Neurological:  Positive for light-headedness and headaches. Negative for dizziness.  Physical Exam   Blood pressure (!) 97/39, pulse 87, temperature 98.1 F (36.7 C), temperature source Oral, last menstrual period 08/09/2020, SpO2 96 %.  Patient Vitals for the past 24 hrs:  BP Temp Temp src Pulse SpO2  02/25/21 1420 (!) 104/59 -- -- 83 99 %  02/25/21 1415 (!) 97/39 -- -- 87 --  02/25/21 1410 (!) 88/45 -- -- (!) 104 --  02/25/21 1408 (!) 97/55 -- -- 74 --  02/25/21 1407 -- -- -- -- 96 %  02/25/21 1406 (!) 97/48 -- -- 65 --  02/25/21 1404 100/61 -- -- 73 --  02/25/21 1350 (!) 98/54 98.1 F (36.7 C) Oral 72 97 %   Physical Exam Vitals and nursing note reviewed.  Constitutional:      General: She is not in acute distress.    Appearance: She is well-developed.  HENT:     Head: Normocephalic.  Eyes:     Pupils: Pupils are equal, round, and reactive to light.  Cardiovascular:     Rate and Rhythm: Normal rate and regular rhythm.     Heart sounds: Normal heart sounds.  Pulmonary:     Effort: Pulmonary effort is normal. No respiratory distress.     Breath sounds: Normal breath sounds.  Abdominal:     General: Bowel sounds are normal. There is no distension.     Palpations: Abdomen is soft.     Tenderness: There is generalized abdominal tenderness.  Musculoskeletal:       Legs:  Skin:    General: Skin is warm and dry.  Neurological:     Mental Status: She is alert and oriented to person, place, and time.  Psychiatric:        Mood and Affect: Mood normal.        Behavior: Behavior normal.        Thought Content: Thought content normal.        Judgment: Judgment normal.   Fetal Tracing:  Baseline: 140 Variability: moderate Accels: 10x10 Decels: none  Toco: 5-10  Dilation: Closed Exam by:: Cleone Slim, CNM   MAU Course  Procedures Results for orders placed or performed during the hospital encounter of 02/25/21 (from the past 24 hour(s))   Urinalysis, Routine w reflex microscopic Urine, Clean Catch     Status: Abnormal  Collection Time: 02/25/21  1:51 PM  Result Value Ref Range   Color, Urine YELLOW YELLOW   APPearance HAZY (A) CLEAR   Specific Gravity, Urine 1.015 1.005 - 1.030   pH 6.0 5.0 - 8.0   Glucose, UA NEGATIVE NEGATIVE mg/dL   Hgb urine dipstick NEGATIVE NEGATIVE   Bilirubin Urine NEGATIVE NEGATIVE   Ketones, ur NEGATIVE NEGATIVE mg/dL   Protein, ur NEGATIVE NEGATIVE mg/dL   Nitrite NEGATIVE NEGATIVE   Leukocytes,Ua NEGATIVE NEGATIVE  CBC     Status: Abnormal   Collection Time: 02/25/21  2:54 PM  Result Value Ref Range   WBC 8.0 4.0 - 10.5 K/uL   RBC 3.89 3.87 - 5.11 MIL/uL   Hemoglobin 10.7 (L) 12.0 - 15.0 g/dL   HCT 41.9 (L) 62.2 - 29.7 %   MCV 83.0 80.0 - 100.0 fL   MCH 27.5 26.0 - 34.0 pg   MCHC 33.1 30.0 - 36.0 g/dL   RDW 98.9 21.1 - 94.1 %   Platelets 212 150 - 400 K/uL   nRBC 0.0 0.0 - 0.2 %    MDM Throughout CNM assessment and history taking, patient affect flat and patient seemed to be having difficulty answering questions and providing hx. Slow to answer questions and story would change between RN and CNM. Patient self reported a hx of seizures, reporting last seizure was 13 years so. She reported multiple "episodes" in the pregnancy including one in the first trimester when she fell down the stairs and another in the second trimester when she felt lightheaded and unwell and had to lay down but states she woke up feeling better after without being sure how long she was asleep.   Patient became increasingly more aware with a more appropriate affect and ability to answer questions appropriately over the first hour that she was in MAU.  Patient's mother at bedside and reports that patient had seizures in childhood and up until middle/high school. She reports no issues since then. She reports witnesses at the scene of the fall state that the patient fell directly on her abdomen.  LR bolus CBC,  CMP [place] MFM OB Limited  Dr. Craige Cotta neurology consulted- recommends MRI and EEG for initial seizure work up  Dr. Reina Fuse notified of patient arrival and complaint of direct abdominal trauma with recommendation for admission for observation as well as recommendations from neurology.  Assessment and Plan   1. Fall, initial encounter   2. Seizure-like activity (HCC)   3. [redacted] weeks gestation of pregnancy    -Admit to [facility] -Care turned over to MD  Rolm Bookbinder CNM 02/25/2021, 2:20 PM
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
## 2021-02-26 ENCOUNTER — Observation Stay (HOSPITAL_COMMUNITY): Payer: 59

## 2021-02-26 DIAGNOSIS — W19XXXA Unspecified fall, initial encounter: Secondary | ICD-10-CM | POA: Diagnosis not present

## 2021-02-26 DIAGNOSIS — Z3A28 28 weeks gestation of pregnancy: Secondary | ICD-10-CM | POA: Diagnosis not present

## 2021-02-26 DIAGNOSIS — R569 Unspecified convulsions: Secondary | ICD-10-CM

## 2021-02-26 DIAGNOSIS — O368130 Decreased fetal movements, third trimester, not applicable or unspecified: Secondary | ICD-10-CM | POA: Diagnosis not present

## 2021-02-26 LAB — CORTISOL-AM, BLOOD: Cortisol - AM: 9.7 ug/dL (ref 6.7–22.6)

## 2021-02-26 LAB — TSH: TSH: 2.342 u[IU]/mL (ref 0.350–4.500)

## 2021-02-26 MED ORDER — BUTALBITAL-APAP-CAFFEINE 50-325-40 MG PO TABS
1.0000 | ORAL_TABLET | Freq: Once | ORAL | Status: AC
  Administered 2021-02-26: 1 via ORAL
  Filled 2021-02-26: qty 1

## 2021-02-26 NOTE — Progress Notes (Signed)
EEG complete - results pending 

## 2021-02-26 NOTE — Procedures (Signed)
History: 30 yo F with syncope vs seizure  Sedation: None  Technique: This EEG was acquired with electrodes placed according to the International 10-20 electrode system (including Fp1, Fp2, F3, F4, C3, C4, P3, P4, O1, O2, T3, T4, T5, T6, A1, A2, Fz, Cz, Pz). The following electrodes were missing or displaced: none.   Background: The background consists of intermixed alpha and beta activities. There is a well defined posterior dominant rhythm of 9-10 Hz that attenuates with eye opening. Sleep is not recorded.  Photic stimulation: Physiologic driving is present HV with no abnormal discharges  EEG Abnormalities: none  Clinical Interpretation: This normal EEG is recorded in the waking state. There was no seizure or seizure predisposition recorded on this study. Please note that lack of epileptiform activity on EEG does not preclude the possibility of epilepsy.   Ritta Slot, MD [facility] [phone]  If 7pm- 7am, please page neurology on call as listed in [facility].

## 2021-02-26 NOTE — Consult Note (Addendum)
NEUROLOGY CONSULTATION NOTE   Date of service: February 26, 2021 Patient Name: Danielle Velez MRN:  [id number] DOB:  April 26, 1991 Reason for consult: "Evaluate for potential seizures" Requesting Provider: [name], MD _ _ _   _ __   _ __ _ _  __ __   _ __   __ _  History of Present Illness  Danielle Velez is a 30 y.o. female who is [redacted] weeks pregnant with PMH significant for asthma who presents after an event of fall, lightheadedness and passing out.  She reports that she took her son to the game today. Felt lightheaded when she got to the bleachers. Went to the bathroom where the lightheadedness persisted. She walked over to the concession stand line to await her turn. She sat down while waiting as she continued to feel dizzy and lightheaded. She had some water with her and so she drank some and the lightheadedness persisted. When she got tp the front of the line, she tried to hold on to the table and immediately went down to the ground and passed out. She was out for a couple mins and woke up and someone was holding on to her back. She could not feel the baby kicking. She was immediately back to herself within a couple mins and was taken to the ED for evaluation. She can now feel the baby kicking.  She reports that she and family call the events that she has had "seizures" althou she has never been formally given the diagnosis and is not sure if these are indeed seizures.   She has had a total of 7 events in her lifetime. She started having these when she was just 5. She does not remember much about this. 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 She was back to herself within a couple mins. A couple times during these events, she lost control of her bladder.  She also had some lightheadedness last seek when she took her son to the game. She felt she was going to pass out but got to the concession stand and  had some fries and gatorade and the lightheadedness resolved.  She describes this other time when she had the lightheadedness when she was going down the stairs and she slipped and laid down on the stairs. Fortunately did not pass out.  She denies any family hx of seizures, no EtOH use, does not smoke, has asthma and uses antihistamines pretty frequently but had not used it in over 2 months. No prior CNS surgery, no CNS infection, no hx of strokes, no hx of ICH, no hx of meningitis.  She feels that these events are triggered almost always when she is hot. However, takes hot showers and she is fine. She has been eating and drinking water fine. She reports that prior to her pregnancy, she would go long time without eating food or drinking water and she would be fine.     ROS   Constitutional Denies weight loss, fever and chills.   HEENT Denies changes in vision and hearing.   Respiratory Denies SOB and cough.   CV Denies palpitations and CP   GI Denies abdominal pain, nausea, vomiting and diarrhea.   GU Denies dysuria and urinary frequency.   MSK Denies myalgia and joint pain.   Skin Denies rash and pruritus.   Neurological Denies headache and syncope.   Psychiatric  Denies recent changes in mood. Denies anxiety and depression.    Past History   Past Medical History:  Diagnosis Date   Angio-edema    Asthma    Food allergy 03/22/2015   Moderate persistent asthma with acute exacerbation 03/09/2015   Recurrent pleurisy 07/19/2016   Recurrent upper respiratory infection (URI)    Urticaria    Past Surgical History:  Procedure Laterality Date   CESAREAN SECTION     CESAREAN SECTION  09/16/2010   Family History  Problem Relation Age of Onset   Asthma Maternal Grandmother    Lupus Mother    Healthy Father    Allergic rhinitis Neg Hx    Angioedema Neg Hx    Atopy Neg Hx    Eczema Neg Hx    Immunodeficiency Neg Hx    Urticaria Neg Hx    Social History   Socioeconomic History    Marital status: Single    Spouse name: Not on file   Number of children: Not on file   Years of education: Not on file   Highest education level: Not on file  Occupational History   Not on file  Tobacco Use   Smoking status: Never   Smokeless tobacco: Never  Vaping Use   Vaping Use: Never used  Substance and Sexual Activity   Alcohol use: No    Alcohol/week: 0.0 standard drinks   Drug use: No   Sexual activity: Not Currently  Other Topics Concern   Not on file  Social History Narrative   Not on file   Social Determinants of Health   Financial Resource Strain: Not on file  Food Insecurity: Not on file  Transportation Needs: Not on file  Physical Activity: Not on file  Stress: Not on file  Social Connections: Not on file   Allergies  Allergen Reactions   Effexor [Venlafaxine] Other (See Comments)    Shaky    Other Hives    DAIRY and WHEAT.  REACTION HIVES AND ITCHING.   Shellfish Allergy Itching and Swelling    Medications   Medications Prior to Admission  Medication Sig Dispense Refill Last Dose   albuterol (PROVENTIL) (2.5 MG/3ML) 0.083% nebulizer solution Take 3 mLs (2.5 mg total) by nebulization every 6 (six) hours as needed for wheezing or shortness of breath. 75 mL 0 02/24/2021   budesonide-formoterol (SYMBICORT) 160-4.5 MCG/ACT inhaler Inhale 2 puffs twice a day with a spacer to prevent cough or wheeze. Rinse mouth after use. 10.2 g 1 02/23/2021   Prenatal Vit-Fe Fumarate-FA ([employer] PRENATAL) 27-0.8 MG TABS Take 1 tablet by mouth daily.   02/25/2021   albuterol (VENTOLIN HFA) 108 (90 Base) MCG/ACT inhaler Inhale 2 puffs every 4 hours as needed for cough, wheeze, tightness in chest, or shortness of breath. 18 g 1    budesonide (RHINOCORT ALLERGY) 32 MCG/ACT nasal spray Place 2 sprays into both nostrils daily. 5 mL 5    cetirizine (ZYRTEC) 10 MG tablet Take 1 tablet (10 mg total) by mouth daily. 30 tablet 5    EPINEPHrine 0.3 mg/0.3 mL IJ SOAJ injection Inject 1 mL  into the muscle as needed.        Vitals   Vitals:   02/25/21 1734 02/25/21 2039 02/25/21 2044 02/25/21 2201  BP:    130/61  Pulse:      Resp:    18  Temp:    98.5 F (36.9 C)  TempSrc:    Oral  SpO2: 100% 99% 99% 98%  There is no height or weight on file to calculate BMI.  Physical Exam   General: Laying comfortably in bed; in no acute distress.  HENT: Normal oropharynx and mucosa. Normal external appearance of ears and nose.  Neck: Supple, no pain or tenderness  CV: No JVD. No peripheral edema.  Pulmonary: Symmetric Chest rise. Normal respiratory effort.  Abdomen: Soft to touch, non-tender.  Ext: No cyanosis, edema, or deformity  Skin: No rash. Normal palpation of skin.   Musculoskeletal: Normal digits and nails by inspection. No clubbing.   Neurologic Examination  Mental status/Cognition: Alert, oriented to self, place, month and year, good attention.  Speech/language: Fluent, comprehension intact, object naming intact, repetition intact.  Cranial nerves:   CN II Pupils equal and reactive to light, no VF deficits    CN III,IV,VI EOM intact, no gaze preference or deviation, no nystagmus    CN V normal sensation in V1, V2, and V3 segments bilaterally    CN VII no asymmetry, no nasolabial fold flattening    CN VIII normal hearing to speech    CN IX & X normal palatal elevation, no uvular deviation    CN XI 5/5 head turn and 5/5 shoulder shrug bilaterally    CN XII midline tongue protrusion    Motor:  Muscle bulk: normal, tone normal, pronator drift none tremor none Mvmt Root Nerve  Muscle Right Left Comments  SA C5/6 Ax Deltoid 5 5   EF C5/6 Mc Biceps 5 5   EE C6/7/8 Rad Triceps 5 5   WF C6/7 Med FCR     WE C7/8 PIN ECU     F Ab C8/T1 U ADM/FDI 5 5   HF L1/2/3 Fem Illopsoas 5 5   KE L2/3/4 Fem Quad 5 5   DF L4/5 D Peron Tib Ant 5 5   PF S1/2 Tibial Grc/Sol 5 5    Reflexes:  Right Left Comments  Pectoralis      Biceps (C5/6) 2 2   Brachioradialis (C5/6)  2 2    Triceps (C6/7) 2 2    Patellar (L3/4) 2 2    Achilles (S1)      Hoffman      Plantar     Jaw jerk    Sensation:  Light touch Intact throughout   Pin prick    Temperature    Vibration   Proprioception    Coordination/Complex Motor:  - Finger to Nose intact BL - Heel to shin intact BL - Rapid alternating movement intact BL - Gait: Did not assess given ongoing fetal monitoring.  Labs   CBC:  Recent Labs  Lab 02/25/21 1454  WBC 8.0  HGB 10.7*  HCT 32.3*  MCV 83.0  PLT 212    Basic Metabolic Panel:  Lab Results  Component Value Date   NA 135 02/25/2021   K 3.9 02/25/2021   CO2 21 (L) 02/25/2021   GLUCOSE 71 02/25/2021   BUN 7 02/25/2021   CREATININE 0.59 02/25/2021   CALCIUM 8.7 (L) 02/25/2021   GFRNONAA >60 02/25/2021   GFRAA >60 09/30/2016   Lipid Panel: No results found for: LDLCALC HgbA1c: No results found for: HGBA1C Urine Drug Screen: No results found for: LABOPIA, COCAINSCRNUR, LABBENZ, AMPHETMU, THCU, LABBARB  Alcohol Level No results found for: [facility]  MRI Brain: Personally reviewed and negative for any acute abnormality  cEEG:  pending  Impression   Danielle Velez is a 30 y.o. female with PMH significant for who is [redacted] weeks pregnant  with PMH significant for asthma who presents after an event of fall, lightheadedness and passing out. It is very difficult to characterize these events based on history alone. They seem most consistent with potential vasovagal syncope but some features could be considered to be more consistent with a seizure. Particularly the fact that these have common semiology and she has foaming at her mouth and is out for a couple mins during these events.  I dont think that starting her on seizure meds right away without additional workup is the right choice given that they pose risks and side effects not just for her but for the fetus too. I think we shoud get a 24 hours cEEG to evaluate for any potential epileptogenic  abnormalities or attempt to characterize her events. Important to consider that given she is pregnant, a seizure can have consequences not just for her but also the fetus. So I want to be sure that we try our best to see if we can capture an event or evaluate for any potential epileptogenic abnormalities.   Impression: - rule out seizure. Recommendations  - Will do 24 hours of cEEG with hyperventilation and photic stimulation to evaluate for any epileptiform discharges or seizures - Hold off on AEDs, I would be more inclined to start Lamictal or Keppra if the cEEG shows any epileptogenic abnormalities. - I ordered TSH, Cortisol to evaluate for hormonal causes of potential orthostasis - I ordered orthostatic vitals x 1. - Neurology will follow along. ______________________________________________________________________   Plan discussed with patient and with bedside RN.  Thank you for the opportunity to take part in the care of this patient. If you have any further questions, please contact the neurology consultation attending.  Signed,  Erick Blinks [facility] Pager Number [phone] _ _ _   _ __   _ __ _ _  __ __   _ __   __ _

## 2021-02-26 NOTE — Progress Notes (Signed)
S: +FM, denies VB, LOF, cramping. States abdomen is much less tender than yesterday. Denies any further dizziness, lightheadedness, HA. Aware of neuro's plan for EEG and labs today  O: BP (!) 97/53 (BP Location: Left Arm)   Pulse 79   Temp 98.5 F (36.9 C) (Oral)   Resp 17   LMP 08/09/2020   SpO2 99%  Gen: NAD CV: CTAB, RRR Abd: Gravid, improved TTP from yesterday GU deferred MSK: calf edema/TTP BL  A/P: 30yo G3P1011 @ 28 6/7 by LMP c/w TVUS HD#2 for 24hr obs after fall with possible seizure activity.   1) S/p fall -Continuous monitoring cat 1, minimal activity on TOCO -Change monitoring to q-shift  2) Possible seizure -H/o disorder as a child/teen, no med history -Neuro following, appreciate recs. MRI brain WNL, TSH/cortisol WNL this AM -EEG completed and results pending  New diagnosis polyhydramnios, likely rescan at 34wks

## 2021-02-26 NOTE — Progress Notes (Signed)
VLTM started  Baseline EEG done prior .  [facility] notified.  Event button tested

## 2021-02-27 ENCOUNTER — Observation Stay (HOSPITAL_BASED_OUTPATIENT_CLINIC_OR_DEPARTMENT_OTHER): Payer: 59

## 2021-02-27 DIAGNOSIS — R55 Syncope and collapse: Secondary | ICD-10-CM | POA: Diagnosis not present

## 2021-02-27 DIAGNOSIS — R404 Transient alteration of awareness: Secondary | ICD-10-CM

## 2021-02-27 DIAGNOSIS — R569 Unspecified convulsions: Secondary | ICD-10-CM | POA: Diagnosis not present

## 2021-02-27 DIAGNOSIS — O368130 Decreased fetal movements, third trimester, not applicable or unspecified: Secondary | ICD-10-CM | POA: Diagnosis not present

## 2021-02-27 LAB — ECHOCARDIOGRAM COMPLETE
Area-P 1/2: 3.21 cm2
S' Lateral: 2.5 cm

## 2021-02-27 NOTE — Progress Notes (Signed)
HD #3, [redacted]W[redacted]D, s/p fall and possible seizure  Feeling ok, nothing new, abd a bit sore, +FM, no VB or LOF Afeb, VSS FHT-looks good for EGA  Stable from OB standpoint, will see what neurology plan is today

## 2021-02-27 NOTE — Progress Notes (Addendum)
Subjective: No further events overnight.  Patient states she has had similar episodes in the past and the following factors always been her standing up as well as being outside in the hot heat (except 1 episode where she was in the house applying make-up or standing up) she states most of the time these episodes resolve when she gets down or lays down but has occasionally had episodes where she loses consciousness briefly.  Denies any preceding aura other than lightheadedness, jerking of extremities, bowel bladder incontinence.  Denies any epilepsy risk factors (term birth via C-section, denies febrile seizures, denies family history of epilepsy, denies previous head injury with loss of consciousness).  ROS: negative except above  Examination  Vital signs in last 24 hours: Temp:  [97.9 F (36.6 C)-98.5 F (36.9 C)] 98.4 F (36.9 C) (10/17 0831) Pulse Rate:  [64-84] 80 (10/17 0831) Resp:  [18] 18 (10/17 0831) BP: (95-123)/(47-65) 123/55 (10/17 0831) SpO2:  [99 %-100 %] 99 % (10/17 0831)  General: lying in bed, NAD CVS: pulse-normal rate and rhythm RS: breathing comfortably Extremities: normal  Neuro: AOx3, cranial nerves 2-12 grossly intact, spontaneously moving all extremities in bed  Basic Metabolic Panel: Recent Labs  Lab 02/25/21 1454  NA 135  K 3.9  CL 105  CO2 21*  GLUCOSE 71  BUN 7  CREATININE 0.59  CALCIUM 8.7*    CBC: Recent Labs  Lab 02/25/21 1454  WBC 8.0  HGB 10.7*  HCT 32.3*  MCV 83.0  PLT 212     Coagulation Studies: No results for input(s): LABPROT, INR in the last 72 hours.  Imaging MRI brain without contrast 02/25/2021: Normal brain MRI   ASSESSMENT AND PLAN: 30 year old female currently [redacted] weeks pregnant who presented with an episode of transient alteration of awareness.  Transient alteration of awareness -Patient reports feeling lightheaded for over 10 minutes prior to the episode.  She does not have amenable to risk factors.  Semiology of  the episode, normal MRI and normal EEG for about 24 hours makes this episode highly unlikely to be an epileptic event. -Differentials include vasovagal syncope versus cardiogenic syncope versus POTS  Recommendations -We will discontinue LTM EEG  -Discussed with patient that although it is impossible to rule out with 100% certainty that this episode was not epileptic, the semiology of her episodes as well as normal MRI and EEG makes it highly unlikely that patient has epilepsy.  Due to low suspicion for epilepsy , the potential risk of AEDs outweigh the benefits at this point.  Therefore, I would not recommend starting her on AEDs. -We will also order echocardiogram as part of syncope work-up -I did discuss seizure precautions including do not drive per [place] law - if TEE is within normal limits, can consider Holter monitor and if needed, cranial vessel imaging and tilt table testing as an outpatient. -Recommend follow-up with neurology as an outpatient after patient has delivered her baby or sooner if episodes persist. -Was recommended return to ER if episodes persist and at that point could consider prolonged (3 to 5 days) EEG monitoring.  Seizure precautions: Per [facility] statutes, patients with seizures are not allowed to drive until they have been seizure-free for six months and cleared by a physician    Use caution when using heavy equipment or power tools. Avoid working on ladders or at heights. Take showers instead of baths. Ensure the water temperature is not too high on the home water heater. Do not go swimming  alone. Do not lock yourself in a room alone (i.e. bathroom). When caring for infants or small children, sit down when holding, feeding, or changing them to minimize risk of injury to the child in the event you have a seizure. Maintain good sleep hygiene. Avoid alcohol.    If patient has another seizure, call 911 and bring them back to the ED if: A.  The seizure lasts  longer than 5 minutes.      B.  The patient doesn't wake shortly after the seizure or has new problems such as difficulty seeing, speaking or moving following the seizure C.  The patient was injured during the seizure D.  The patient has a temperature over 102 F (39C) E.  The patient vomited during the seizure and now is having trouble breathing    During the Seizure   - First, ensure adequate ventilation and place patients on the floor on their left side  Loosen clothing around the neck and ensure the airway is patent. If the patient is clenching the teeth, do not force the mouth open with any object as this can cause severe damage - Remove all items from the surrounding that can be hazardous. The patient may be oblivious to what's happening and may not even know what he or she is doing. If the patient is confused and wandering, either gently guide him/her away and block access to outside areas - Reassure the individual and be comforting - Call 911. In most cases, the seizure ends before EMS arrives. However, there are cases when seizures may last over 3 to 5 minutes. Or the individual may have developed breathing difficulties or severe injuries. If a pregnant patient or a person with diabetes develops a seizure, it is prudent to call an ambulance. - Finally, if the patient does not regain full consciousness, then call EMS. Most patients will remain confused for about 45 to 90 minutes after a seizure, so you must use judgment in calling for help. - Avoid restraints but make sure the patient is in a bed with padded side rails - Place the individual in a lateral position with the neck slightly flexed; this will help the saliva drain from the mouth and prevent the tongue from falling backward - Remove all nearby furniture and other hazards from the area - Provide verbal assurance as the individual is regaining consciousness - Provide the patient with privacy if possible - Call for help and start  treatment as ordered by the caregiver    After the Seizure (Postictal Stage)   After a seizure, most patients experience confusion, fatigue, muscle pain and/or a headache. Thus, one should permit the individual to sleep. For the next few days, reassurance is essential. Being calm and helping reorient the person is also of importance.   Most seizures are painless and end spontaneously. Seizures are not harmful to others but can lead to complications such as stress on the lungs, brain and the heart. Individuals with prior lung problems may develop labored breathing and respiratory distress.    I have spent a total of  45  minutes with the patient reviewing hospital notes,  test results, labs and examining the patient as well as establishing an assessment and plan that was discussed personally with the patient, RN and OB team.  > 50% of time was spent in direct patient care.   Lindie Spruce Epilepsy [facility] For questions after 5pm please refer to AMION to reach the Neurologist on call

## 2021-02-27 NOTE — Discharge Instructions (Signed)
Call for increasing abdominal pain, reduced fetal movement, vaginal bleeding or leaking fluid Call for any seizure-like activity

## 2021-02-27 NOTE — Progress Notes (Signed)
Echocardiogram 2D Echocardiogram has been performed.  Warren Lacy Alexine Pilant RDCS 02/27/2021, 4:03 PM

## 2021-02-27 NOTE — Progress Notes (Signed)
Discontinued cEEG study.  Notified [facility] monitoring.  No skin breakdown observed. 

## 2021-02-27 NOTE — Procedures (Addendum)
Patient Name: Danielle Velez  MRN: [id number]  Epilepsy Attending: Charlsie Quest  Referring Physician/Provider: Dr Erick Blinks Duration: 02/26/2021 1110 to 02/27/2021 1052  Patient history: 30 year old female who is [redacted] weeks pregnant presented after an episode of fall, lightheadedness and passing out.  EEG to evaluate for seizures.  Level of alertness: Awake, asleep  AEDs during EEG study: None  Technical aspects: This EEG study was done with scalp electrodes positioned according to the 10-20 International system of electrode placement. Electrical activity was acquired at a sampling rate of 500Hz  and reviewed with a high frequency filter of 70Hz  and a low frequency filter of 1Hz . EEG data were recorded continuously and digitally stored.   Description: The posterior dominant rhythm consists of 9-10 Hz activity of moderate voltage (25-35 uV) seen predominantly in posterior head regions, symmetric and reactive to eye opening and eye closing. Sleep was characterized by vertex waves, sleep spindles (12 to 14 Hz), maximal frontocentral region.  Hyperventilation and photic stimulation were not performed.     IMPRESSION: This study is within normal limits. No seizures or epileptiform discharges were seen throughout the recording.  Shakeela Rabadan 

## 2021-03-01 NOTE — Discharge Summary (Signed)
Physician Discharge Summary  Patient ID: Danielle Velez MRN: [id number] DOB/AGE: 1991-01-02 30 y.o.  Admit date: 02/25/2021 Discharge date: 03/01/2021  Admission Diagnoses:  IUP at 28+ weeks, fall with abd trauma  Discharge Diagnoses: same Active Problems:   Abdominal trauma   Discharged Condition: good  Hospital Course: Pt admitted by Dr. Reina Fuse.  Ultrasound with no obvious abruption.  Fetal monitoring appropriate for EGA, no regular ctx.  Neurology consult for possible seizure, normal brain MRI, no seizures on EEG, normal ECHO.  On 10-17 she was stable enough for discharge home  Consults: neurology  Discharge Exam: Blood pressure (!) 116/52, pulse 75, temperature 97.7 F (36.5 C), temperature source Oral, resp. rate 18, last menstrual period 08/09/2020, SpO2 99 %. General appearance: alert  Disposition: Discharge disposition: 01-Home or Self Care       Discharge Instructions     Discharge activity:  No Restrictions   Complete by: As directed    Discharge diet:  No restrictions   Complete by: As directed    No sexual activity restrictions   Complete by: As directed    Notify physician for leaking of fluid   Complete by: As directed    Notify physician for uterine contractions.  These may be painless and feel like the uterus is tightening or the baby is  "balling up"   Complete by: As directed    Notify physician for vaginal bleeding   Complete by: As directed    PRETERM LABOR:  Includes any of the follwing symptoms that occur between 20 - [redacted] weeks gestation.  If these symptoms are not stopped, preterm labor can result in preterm delivery, placing your baby at risk   Complete by: As directed       Allergies as of 02/27/2021       Reactions   Effexor [venlafaxine] Other (See Comments)   Shaky    Other Hives   DAIRY and WHEAT.  REACTION HIVES AND ITCHING.   Shellfish Allergy Itching, Swelling        Medication List     TAKE these medications     albuterol 108 (90 Base) MCG/ACT inhaler Commonly known as: VENTOLIN HFA Inhale 2 puffs every 4 hours as needed for cough, wheeze, tightness in chest, or shortness of breath.   albuterol (2.5 MG/3ML) 0.083% nebulizer solution Commonly known as: PROVENTIL Take 3 mLs (2.5 mg total) by nebulization every 6 (six) hours as needed for wheezing or shortness of breath.   budesonide 32 MCG/ACT nasal spray Commonly known as: Rhinocort Allergy Place 2 sprays into both nostrils daily.   budesonide-formoterol 160-4.5 MCG/ACT inhaler Commonly known as: Symbicort Inhale 2 puffs twice a day with a spacer to prevent cough or wheeze. Rinse mouth after use.   cetirizine 10 MG tablet Commonly known as: ZYRTEC Take 1 tablet (10 mg total) by mouth daily.   [facility] Prenatal 27-0.8 MG Tabs Take 1 tablet by mouth daily.   EPINEPHrine 0.3 mg/0.3 mL Soaj injection Commonly known as: EPI-PEN Inject 1 mL into the muscle as needed.        Follow-up Information     Edwinna Areola, DO Follow up.   Specialty: Obstetrics and Gynecology Why: keep next scheduled appointment, call with any problems Contact information: [address] [place] [address] [phone]                 Signed: Leighton Roach Hyrum Shaneyfelt 03/01/2021, 9:58 AM

## 2021-03-23 ENCOUNTER — Other Ambulatory Visit: Payer: Self-pay

## 2021-03-23 ENCOUNTER — Emergency Department (HOSPITAL_COMMUNITY)
Admission: EM | Admit: 2021-03-23 | Discharge: 2021-03-24 | Disposition: A | Discharge status: Home / Self Care | Payer: 59 | Attending: Emergency Medicine | Admitting: Emergency Medicine

## 2021-03-23 ENCOUNTER — Encounter (HOSPITAL_COMMUNITY): Payer: Self-pay

## 2021-03-23 DIAGNOSIS — R55 Syncope and collapse: Secondary | ICD-10-CM | POA: Diagnosis not present

## 2021-03-23 DIAGNOSIS — R8271 Bacteriuria: Secondary | ICD-10-CM | POA: Insufficient documentation

## 2021-03-23 DIAGNOSIS — J45909 Unspecified asthma, uncomplicated: Secondary | ICD-10-CM | POA: Diagnosis not present

## 2021-03-23 DIAGNOSIS — O26893 Other specified pregnancy related conditions, third trimester: Secondary | ICD-10-CM | POA: Diagnosis present

## 2021-03-23 DIAGNOSIS — R109 Unspecified abdominal pain: Secondary | ICD-10-CM | POA: Insufficient documentation

## 2021-03-23 DIAGNOSIS — Z3A32 32 weeks gestation of pregnancy: Secondary | ICD-10-CM | POA: Insufficient documentation

## 2021-03-23 DIAGNOSIS — Z79899 Other long term (current) drug therapy: Secondary | ICD-10-CM | POA: Diagnosis not present

## 2021-03-23 LAB — CBC WITH DIFFERENTIAL/PLATELET
Abs Immature Granulocytes: 0.17 10*3/uL — ABNORMAL HIGH (ref 0.00–0.07)
Basophils Absolute: 0 10*3/uL (ref 0.0–0.1)
Basophils Relative: 0 %
Eosinophils Absolute: 0.2 10*3/uL (ref 0.0–0.5)
Eosinophils Relative: 3 %
HCT: 30.2 % — ABNORMAL LOW (ref 36.0–46.0)
Hemoglobin: 10.2 g/dL — ABNORMAL LOW (ref 12.0–15.0)
Immature Granulocytes: 2 %
Lymphocytes Relative: 23 %
Lymphs Abs: 1.9 10*3/uL (ref 0.7–4.0)
MCH: 27.9 pg (ref 26.0–34.0)
MCHC: 33.8 g/dL (ref 30.0–36.0)
MCV: 82.5 fL (ref 80.0–100.0)
Monocytes Absolute: 0.6 10*3/uL (ref 0.1–1.0)
Monocytes Relative: 7 %
Neutro Abs: 5.1 10*3/uL (ref 1.7–7.7)
Neutrophils Relative %: 65 %
Platelets: 237 10*3/uL (ref 150–400)
RBC: 3.66 MIL/uL — ABNORMAL LOW (ref 3.87–5.11)
RDW: 13.3 % (ref 11.5–15.5)
WBC: 8 10*3/uL (ref 4.0–10.5)
nRBC: 0 % (ref 0.0–0.2)

## 2021-03-23 LAB — URINALYSIS, ROUTINE W REFLEX MICROSCOPIC
Bilirubin Urine: NEGATIVE
Glucose, UA: NEGATIVE mg/dL
Ketones, ur: 5 mg/dL — AB
Leukocytes,Ua: NEGATIVE
Nitrite: NEGATIVE
Protein, ur: NEGATIVE mg/dL
Specific Gravity, Urine: 1.008 (ref 1.005–1.030)
pH: 6 (ref 5.0–8.0)

## 2021-03-23 LAB — COMPREHENSIVE METABOLIC PANEL
ALT: 19 U/L (ref 0–44)
AST: 21 U/L (ref 15–41)
Albumin: 2.8 g/dL — ABNORMAL LOW (ref 3.5–5.0)
Alkaline Phosphatase: 97 U/L (ref 38–126)
Anion gap: 7 (ref 5–15)
BUN: 8 mg/dL (ref 6–20)
CO2: 21 mmol/L — ABNORMAL LOW (ref 22–32)
Calcium: 8.4 mg/dL — ABNORMAL LOW (ref 8.9–10.3)
Chloride: 107 mmol/L (ref 98–111)
Creatinine, Ser: 0.51 mg/dL (ref 0.44–1.00)
GFR, Estimated: >60 mL/min (ref >60)
Glucose, Bld: 97 mg/dL (ref 70–99)
Potassium: 3.5 mmol/L (ref 3.5–5.1)
Sodium: 135 mmol/L (ref 135–145)
Total Bilirubin: 0.3 mg/dL (ref 0.3–1.2)
Total Protein: 6.4 g/dL — ABNORMAL LOW (ref 6.5–8.1)

## 2021-03-23 LAB — CBG MONITORING, ED: Glucose-Capillary: 88 mg/dL (ref 70–99)

## 2021-03-23 LAB — HCG, QUANTITATIVE, PREGNANCY: hCG, Beta Chain, Quant, S: 17398 m[IU]/mL — ABNORMAL HIGH (ref <5)

## 2021-03-23 MED ORDER — CEPHALEXIN 500 MG PO CAPS: 500.0000 mg | ORAL_CAPSULE | Freq: Two times a day (BID) | ORAL | 0 refills | Status: AC

## 2021-03-23 MED ORDER — CEPHALEXIN 500 MG PO CAPS: 500.0000 mg | ORAL_CAPSULE | Freq: Two times a day (BID) | ORAL | 0 refills | Status: DC

## 2021-03-23 MED ORDER — SODIUM CHLORIDE 0.9 % IV BOLUS
1000.0000 mL | Freq: Once | INTRAVENOUS | Status: AC
  Administered 2021-03-23: 1000 mL via INTRAVENOUS

## 2021-03-23 NOTE — Progress Notes (Signed)
Call received regarding pregnant patient in rm 16 at [facility] ED for evaluation. En route to [facility] ED.  Jordan Hawks, RN  OB RR

## 2021-03-23 NOTE — ED Provider Notes (Signed)
[facility]-EMERGENCY DEPT Provider Note   CSN: [id number] Arrival date & time: 03/23/21  2022     History Chief Complaint  Patient presents with   Seizures    Danielle Velez is a 30 y.o. female.  This is a 31 y.o. female G3P1011 currently at approx [redacted] wks gestation with significant medical history as below, including asthma who presents to the ED with complaint of syncope, abd pain. Saw OBGYN earlier today for routine evaluation, she was having some intermittent abdominal cramping/contraction sensation that was told was normal at visit earlier today per pt.   She says she was cooking dinner, she felt as though the kitchen was very hot, she felt lightheaded, diaphoretic.  She sat on the steps and believe that she passed out for couple seconds.  No observed seizure activity.  Returned to baseline when she woke up.  No incontinence, no tongue biting, no head injury. Ambulatory after this event.  Chart review patient with similar episode approx 1 month ago, eval by neurology, received EEG, MRI, echo which did not demonstrate abnormalities.  Was not started on AEDs.   Patient denies vaginal bleeding or rupture fluids, no vision changes, no change urination.  No regular contractions.  Having intermittent contraction-like sensations last few seconds at variable intensities and at variable intervals.  The history is provided by the patient. No language interpreter was used.      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   Moderate persistent asthma 03/22/2015   Allergic rhinitis 03/09/2015    Past Surgical History:  Procedure Laterality Date   CESAREAN SECTION     CESAREAN SECTION  09/16/2010     OB History     Gravida  3   Para  1   Term  1   Preterm      AB  1   Living  1      SAB  1   IAB      Ectopic      Multiple      Live Births  1           Family History  Problem Relation Age of Onset   Asthma Maternal Grandmother    Lupus Mother    Healthy Father    Allergic rhinitis Neg Hx    Angioedema Neg Hx    Atopy Neg Hx    Eczema Neg Hx    Immunodeficiency Neg Hx    Urticaria Neg Hx     Social History   Tobacco Use   Smoking status: Never   Smokeless tobacco: Never  Vaping Use   Vaping Use: Never used  Substance Use Topics   Alcohol use: No    Alcohol/week: 0.0 standard drinks   Drug use: No    Home Medications Prior to Admission medications   Medication Sig Start Date End Date Taking? Authorizing Provider  albuterol (PROVENTIL) (2.5 MG/3ML) 0.083% nebulizer solution Take 3 mLs (2.5 mg total) by nebulization every 6 (six) hours as needed for wheezing or shortness of breath. 02/16/21   Alfonse Spruce, MD  albuterol (VENTOLIN HFA) 108 (90 Base) MCG/ACT inhaler Inhale 2 puffs every 4 hours as needed for cough, wheeze, tightness in chest, or shortness of breath. 01/25/21   Alfonse Spruce, MD  budesonide (RHINOCORT ALLERGY) 32 MCG/ACT nasal spray Place 2 sprays into both nostrils daily. 02/16/21 03/18/21  Alfonse Spruce, MD  budesonide-formoterol [facility]) 160-4.5 MCG/ACT inhaler Inhale 2 puffs twice a day with a spacer to prevent cough or wheeze. Rinse mouth after use. 01/25/21   Alfonse Spruce, MD  cephALEXin (KEFLEX) 500 MG capsule Take 1 capsule (500 mg total) by mouth 2 (two) times daily for  7 days. 03/23/21 03/30/21  Sloan Leiter, DO  cetirizine (ZYRTEC) 10 MG tablet Take 1 tablet (10 mg total) by mouth daily. 02/16/21   Alfonse Spruce, MD  EPINEPHrine 0.3 mg/0.3 mL IJ SOAJ injection Inject 1 mL into the muscle as needed.    [provider]  Prenatal Vit-Fe Fumarate-FA (CVS PRENATAL) 27-0.8 MG TABS Take 1 tablet by mouth daily. 09/13/20   [provider]    Allergies    Effexor [venlafaxine], Other, and Shellfish allergy  Review of Systems   Review of Systems  Physical Exam Updated Vital Signs BP 108/68   Pulse 69   Resp 18   LMP 08/09/2020   SpO2 100%   Physical Exam Vitals and nursing note reviewed.  Constitutional:      General: She is not in acute distress.    Appearance: Normal appearance.  HENT:     Head: Normocephalic and atraumatic.     Right Ear: External ear normal.     Left Ear: External ear normal.     Nose: Nose normal.     Mouth/Throat:     Mouth: Mucous membranes are moist.  Eyes:     General: No scleral icterus.       Right eye: No discharge.        Left eye: No discharge.     Extraocular Movements: Extraocular movements intact.     Pupils: Pupils are equal, round, and reactive to light.     Comments: No visual abnormalities  Cardiovascular:     Rate and Rhythm: Normal rate and regular rhythm.     Pulses: Normal pulses.     Heart sounds: Normal heart sounds.  Pulmonary:     Effort: Pulmonary effort is normal. No respiratory distress.     Breath sounds: Normal breath sounds.  Abdominal:     General: Abdomen is flat.     Tenderness: There is no abdominal tenderness.     Comments: Gravid abdomen  Musculoskeletal:        General: Normal range of motion.     Cervical back: Normal range of motion.     Right lower leg: No edema.     Left lower leg: No edema.  Skin:    General: Skin is warm and dry.     Capillary Refill: Capillary refill takes less than 2 seconds.  Neurological:     Mental Status: She is alert.   Psychiatric:        Mood and Affect: Mood normal.        Behavior: Behavior normal.    ED Results / Procedures / Treatments   Labs (all labs ordered are listed, but  only abnormal results are displayed) Labs Reviewed  CBC WITH DIFFERENTIAL/PLATELET - Abnormal; Notable for the following components:      Result Value   RBC 3.66 (*)    Hemoglobin 10.2 (*)    HCT 30.2 (*)    Abs Immature Granulocytes 0.17 (*)    All other components within normal limits  COMPREHENSIVE METABOLIC PANEL - Abnormal; Notable for the following components:   CO2 21 (*)    Calcium 8.4 (*)    Total Protein 6.4 (*)    Albumin 2.8 (*)    All other components within normal limits  URINALYSIS, ROUTINE W REFLEX MICROSCOPIC - Abnormal; Notable for the following components:   APPearance HAZY (*)    Hgb urine dipstick SMALL (*)    Ketones, ur 5 (*)    Bacteria, UA RARE (*)    All other components within normal limits  HCG, QUANTITATIVE, PREGNANCY - Abnormal; Notable for the following components:   hCG, Beta Chain, Quant, S 17,398 (*)    All other components within normal limits  URINE CULTURE  CBG MONITORING, ED    EKG EKG Interpretation  Date/Time:  Thursday March 23 2021 20:33:03 EST Ventricular Rate:  75 PR Interval:  133 QRS Duration: 84 QT Interval:  381 QTC Calculation: 426 R Axis:   40 Text Interpretation: Sinus rhythm Unchanged from prior Confirmed by Tanda Rockers ([id number]) on 03/23/2021 11:42:58 PM  Radiology No results found.  Procedures Procedures   Medications Ordered in ED Medications  sodium chloride 0.9 % bolus 1,000 mL (1,000 mLs Intravenous New Bag/Given 03/23/21 2247)    ED Course  I have reviewed the triage vital signs and the nursing notes.  Pertinent labs & imaging results that were available during my care of the patient were reviewed by me and considered in my medical decision making (see chart for details).    MDM Rules/Calculators/A&P                            CC: syncope  This patient complains of above; this involves an extensive number of treatment options and is a complaint that carries with it a high risk of complications and morbidity. Vital signs were reviewed. Serious etiologies considered.  Record review:  Previous records obtained and reviewed   Work up as above, notable for:  Lab results that were available during my care of the patient were reviewed by me and considered in my medical decision making.  Labs are re-assurring, she has bacteria in her urine.  Asymptomatic bacteria in pregnancy.  Will give antibiotics.  Urine culture sent.  Patient was eval by OB rep response team.  Received IV fluids.  Cleared by OB rapid response for discharge.  My suspicion for seizure is very low at this point.  She had similar presentation approximately 1 month ago and was cleared by neurology.  My suspicion for acute intracranial abnormality is very low given normal neurologic exam, no evidence of head trauma, returned to baseline.  The patient improved significantly and was discharged in stable condition. Detailed discussions were had with the patient regarding current findings, and need for close f/u with PCP or on call doctor. The patient has been instructed to return immediately if the symptoms worsen in any way for re-evaluation. Patient verbalized understanding and is in agreement with current care plan. All questions answered prior to discharge.        This chart was dictated using voice recognition  software.  Despite best efforts to proofread,  errors can occur which can change the documentation meaning.  Final Clinical Impression(s) / ED Diagnoses Final diagnoses:  Asymptomatic bacteriuria  Syncope, unspecified syncope type  [redacted] weeks gestation of pregnancy    Rx / DC Orders ED Discharge Orders          Ordered    cephALEXin (KEFLEX) 500 MG capsule  2 times daily,   Status:  Discontinued        03/23/21 2228    cephALEXin  (KEFLEX) 500 MG capsule  2 times daily        03/23/21 2229             Sloan Leiter, DO 03/23/21 2351

## 2021-03-23 NOTE — ED Triage Notes (Addendum)
Pt reports having a seizure at home 20 mins ago. Pt states that she was cooking and was able to get to her stairs and sit down. Pt is [redacted] weeks pregnant. Pt states that this happened once before but she is not on medication.

## 2021-03-23 NOTE — Progress Notes (Signed)
22G IV placed in left forearm by IV team.  LR bolus initiated 2245.  Category I tracing continues.

## 2021-03-23 NOTE — Progress Notes (Signed)
1 liter LR bolus finishing. Pt reports less cramping, denies discomfort.  Uc's irregular, and uterine irritability noted. Abdomen mild to palpation.   [facility] ED RN notified. Patient disconnected from EFM at this time and cleared OB.   Jordan Hawks, RN  OB RR

## 2021-03-23 NOTE — Progress Notes (Signed)
Arrival to [facility] ED for G3P1 patient at 32 3/7 wks with complaint of syncopal episode per her boyfriend.  Pt states she was cooking and starting feeling lightheaded, notified her boyfriend and was able to sit down on the stairs.  Reports boyfriend was above her and saying her name multiple times when she came to.  Came to the ED within 30 minutes of episode per patient.   Pt reports hx of previous cesarean section. Reports positive fetal movement and states she has felt "crampy." Declines bleeding nor leaking of fluid. Had appt with primary OB this AM per patient and cervix was closed and soft. Pt states she felt crampy this morning during her OB appt. Pt declines neuro hx.   IV and labs drawn at 2040.  Pt attached to EFM at 2049.  Dr. Reina Fuse contacted at 2053. Order received for liter LR bolus.   RN made aware at 2100.  Notified that IV blew.  IV team consulted.

## 2021-03-23 NOTE — ED Notes (Signed)
Rapid Response OB at bedside.  

## 2021-03-23 NOTE — Progress Notes (Signed)
2150 Category I tracing noted.  Uc's mild to palpation q 2-8 minutes.   Pt reports mild discomfort.   Pt made aware of Dr. Orville Govern plan associated with neurology consult.  Pt instructed to follow up with neurology outpt within the week, keep scheduled OB appt.  Waiting on IV team consult.

## 2021-03-23 NOTE — ED Notes (Signed)
OB RAPID RESPONSE CALLED

## 2021-03-24 LAB — URINE CULTURE

## 2021-03-29 ENCOUNTER — Ambulatory Visit (INDEPENDENT_AMBULATORY_CARE_PROVIDER_SITE_OTHER): Payer: 59 | Admitting: Neurology

## 2021-03-29 ENCOUNTER — Other Ambulatory Visit: Payer: Self-pay

## 2021-03-29 ENCOUNTER — Encounter: Payer: Self-pay | Admitting: Neurology

## 2021-03-29 VITALS — BP 121/75 | HR 98 | Ht 60.0 in | Wt 206.0 lb

## 2021-03-29 DIAGNOSIS — R55 Syncope and collapse: Secondary | ICD-10-CM | POA: Diagnosis not present

## 2021-03-29 NOTE — Progress Notes (Signed)
NEUROLOGY CONSULTATION NOTE  Danielle Velez MRN: [id number] DOB: 1990/08/06  Referring provider: Dr. Ellison Hughs Primary care provider: Alysia Penna, NP  Reason for consult:  syncope  Dear Dr Reina Fuse:  Thank you for your kind referral of Danielle Velez for consultation of the above symptoms. Although her history is well known to you, please allow me to reiterate it for the purpose of our medical record. She is alone in the office today. Records and images were personally reviewed where available.   HISTORY OF PRESENT ILLNESS: This is a pleasant 30 year old right-handed G2P1 [redacted] week pregnant woman with a history of asthma presenting for evaluation of recurrent syncope. She recalls the first episode occurred in childhood at age 27, she was getting her hair curled standing up then passed out. She had 2 syncopal episodes after doing a theater performance in high school, she recalls feeling lightheaded after performing and passing out briefly. She was told she was foaming at the mouth and had lost control of her bladder. Over the years she denies any further loss of consciousness but would have times she feels lightheaded, sits and feels better. She then had another syncopal episode in March 2022. She was sick and had taken a lot of Theraflu, she was doing her make-up and got lightheaded, alerted her boyfriend then lost consciousness, waking up to find vomit on her clothes. In September, she had the similar lightheaded feeling, lay down for 10 minutes and felt better, got up feeling okay, no loss of consciousness. On 02/25/21, she was at her son's game, it was hot and she felt lightheaded again. She went to the concession stand and sat down because she continued to feel lightheaded. She recalls standing up for her turn, the lightheaded feeling became more intense and passed out. She was out for a couple of minutes and woke up with people around her. She was brought to the ER because she could not feel  her baby moving. She was evaluated by Neurology during her admission and had a normal MRI brain without contrast. She had a 24-hour EEG which was also normal. Echocardiogram was normal. It was felt that symptoms are likely due to vasovagal syncope, less likely seizure. She then had another syncopal episode on 03/23/21 while cooking, she felt lightheaded, sat down and got back up to cook with the lightheaded feeling again intensifying. She alerted her boyfriend who found her passed out briefly, she states she wakes up quickly with no focal weakness, tongue bite, confusion. She has had lightheaded episodes without passing out, she usually sits down and symptoms would resolve. This morning she was talking to a customer on the phone and felt lightheaded. She had to hang up the phone, lay down and felt better. She noted a little chest pain today. During the dizzy episodes, she denies any speech or comprehension difficulties, but it takes a lot out of her. She has a small headache after and "feels really light." She denies any olfactory/gustatory hallucinations, deja vu, rising epigastric sensation, focal numbness/tingling/weakness, myoclonic jerks. She had a normal birth and early development.  There is no history of febrile convulsions, CNS infections such as meningitis/encephalitis, significant traumatic brain injury, neurosurgical procedures, or family history of seizures.   PAST MEDICAL HISTORY: Past Medical History:  Diagnosis Date   Angio-edema    Asthma    Food allergy 03/22/2015   Moderate persistent asthma with acute exacerbation 03/09/2015   Recurrent pleurisy 07/19/2016   Recurrent upper respiratory infection (URI)  Urticaria     PAST SURGICAL HISTORY: Past Surgical History:  Procedure Laterality Date   CESAREAN SECTION     CESAREAN SECTION  09/16/2010    MEDICATIONS: Current Outpatient Medications on File Prior to Visit  Medication Sig Dispense Refill   albuterol (PROVENTIL) (2.5  MG/3ML) 0.083% nebulizer solution Take 3 mLs (2.5 mg total) by nebulization every 6 (six) hours as needed for wheezing or shortness of breath. 75 mL 0   albuterol (VENTOLIN HFA) 108 (90 Base) MCG/ACT inhaler Inhale 2 puffs every 4 hours as needed for cough, wheeze, tightness in chest, or shortness of breath. 18 g 1   budesonide (RHINOCORT ALLERGY) 32 MCG/ACT nasal spray Place 2 sprays into both nostrils daily. 5 mL 5   budesonide-formoterol (SYMBICORT) 160-4.5 MCG/ACT inhaler Inhale 2 puffs twice a day with a spacer to prevent cough or wheeze. Rinse mouth after use. 10.2 g 1   cephALEXin (KEFLEX) 500 MG capsule Take 1 capsule (500 mg total) by mouth 2 (two) times daily for 7 days. 14 capsule 0   cetirizine (ZYRTEC) 10 MG tablet Take 1 tablet (10 mg total) by mouth daily. 30 tablet 5   EPINEPHrine 0.3 mg/0.3 mL IJ SOAJ injection Inject 1 mL into the muscle as needed.     Prenatal Vit-Fe Fumarate-FA ([facility] PRENATAL) 27-0.8 MG TABS Take 1 tablet by mouth daily.     No current facility-administered medications on file prior to visit.    ALLERGIES: Allergies  Allergen Reactions   Effexor [Venlafaxine] Other (See Comments)    Shaky    Other Hives    DAIRY and WHEAT.  REACTION HIVES AND ITCHING.   Shellfish Allergy Itching and Swelling    FAMILY HISTORY: Family History  Problem Relation Age of Onset   Asthma Maternal Grandmother    Lupus Mother    Healthy Father    Allergic rhinitis Neg Hx    Angioedema Neg Hx    Atopy Neg Hx    Eczema Neg Hx    Immunodeficiency Neg Hx    Urticaria Neg Hx     SOCIAL HISTORY: Social History   Socioeconomic History   Marital status: Single    Spouse name: Not on file   Number of children: Not on file   Years of education: Not on file   Highest education level: Not on file  Occupational History   Not on file  Tobacco Use   Smoking status: Never   Smokeless tobacco: Never  Vaping Use   Vaping Use: Never used  Substance and Sexual Activity    Alcohol use: No    Alcohol/week: 0.0 standard drinks   Drug use: No   Sexual activity: Not Currently  Other Topics Concern   Not on file  Social History Narrative   Not on file   Social Determinants of Health   Financial Resource Strain: Not on file  Food Insecurity: Not on file  Transportation Needs: Not on file  Physical Activity: Not on file  Stress: Not on file  Social Connections: Not on file  Intimate Partner Violence: Not on file     PHYSICAL EXAM: Vitals:   03/29/21 1311  BP: 121/75  Pulse: 98  SpO2: 100%   General: No acute distress Head:  Normocephalic/atraumatic Skin/Extremities: No rash, no edema Neurological Exam: Mental status: alert and oriented to person, place, and time, no dysarthria or aphasia, Fund of knowledge is appropriate.  Recent and remote memory are intact.  Attention and concentration are normal.  Cranial nerves: CN I: not tested CN II: pupils equal, round and reactive to light, visual fields intact CN III, IV, VI:  full range of motion, no nystagmus, no ptosis CN V: facial sensation intact CN VII: upper and lower face symmetric CN VIII: hearing intact to conversation Bulk & Tone: normal, no fasciculations. Motor: 5/5 throughout with no pronator drift. Sensation: intact to light touch, cold, pin, vibration and joint position sense.  No extinction to double simultaneous stimulation.  Romberg test negative Deep Tendon Reflexes: +1 throughout Cerebellar: no incoordination on finger to nose testing Gait: slightly wide-based, no ataxia Tremor: none   IMPRESSION: This is a pleasant 30 year old right-handed G2P1 [redacted] week pregnant woman with a history of asthma presenting for evaluation of recurrent syncope. Her neurological exam is normal, no clear epilepsy risk factors, she has had a normal brain MRI and 24-hour EEG. Last syncopal episode was last week. We discussed different causes of syncope, there is no clear neurological cause for her  symptoms,symptoms likely vasovagal but would recommend Cardiology evaluation as well. She was advised to increase hydration, try to get herself to a supine position when able, and was given counterpressure maneuvers to do when feeling near syncopal. She was advised to check her BP when she feels lightheaded. It is prudent to recommend that all persons should be free of syncopal episodes for at least six months to be granted the driving privilege. (THE [place] PHYSICIAN'S GUIDE TO DRIVER MEDICAL EVALUATION, Second Edition, Medical Review Branch, Associate Professor, Division of [employer], July 2004). Follow-up as needed, she knows to call for any changes.    Thank you for allowing me to participate in the care of this patient. Please do not hesitate to call for any questions or concerns.   Patrcia Dolly, M.D.  CC: Dr. Reina Fuse, Alysia Penna, NP

## 2021-03-29 NOTE — Patient Instructions (Signed)
Good to meet you!  Referral will be sent to Cardiology  2. Would get a blood pressure monitor to check blood pressure when you feel lightheaded  3. Continue drinking 8 glasses of water a day. When you start feeling lightheaded, try to get yourself in a place you can lay down. If not, try the counterpressure exercises   4. Follow-up as needed, call for any changes

## 2021-04-03 ENCOUNTER — Ambulatory Visit: Payer: 59 | Admitting: Neurology

## 2021-04-10 ENCOUNTER — Inpatient Hospital Stay (HOSPITAL_BASED_OUTPATIENT_CLINIC_OR_DEPARTMENT_OTHER): Payer: 59

## 2021-04-10 ENCOUNTER — Inpatient Hospital Stay (HOSPITAL_COMMUNITY)
Admission: AD | Admit: 2021-04-10 | Discharge: 2021-04-10 | Disposition: A | Discharge status: Home / Self Care | Payer: 59 | Attending: Obstetrics and Gynecology | Admitting: Obstetrics and Gynecology

## 2021-04-10 ENCOUNTER — Other Ambulatory Visit: Payer: Self-pay

## 2021-04-10 ENCOUNTER — Encounter (HOSPITAL_COMMUNITY): Payer: Self-pay | Admitting: Obstetrics and Gynecology

## 2021-04-10 DIAGNOSIS — N939 Abnormal uterine and vaginal bleeding, unspecified: Secondary | ICD-10-CM

## 2021-04-10 DIAGNOSIS — O4693 Antepartum hemorrhage, unspecified, third trimester: Secondary | ICD-10-CM

## 2021-04-10 DIAGNOSIS — Z3A35 35 weeks gestation of pregnancy: Secondary | ICD-10-CM | POA: Insufficient documentation

## 2021-04-10 DIAGNOSIS — K625 Hemorrhage of anus and rectum: Secondary | ICD-10-CM | POA: Insufficient documentation

## 2021-04-10 LAB — URINALYSIS, ROUTINE W REFLEX MICROSCOPIC
Bilirubin Urine: NEGATIVE
Glucose, UA: NEGATIVE mg/dL
Ketones, ur: NEGATIVE mg/dL
Leukocytes,Ua: NEGATIVE
Nitrite: NEGATIVE
Protein, ur: NEGATIVE mg/dL
Specific Gravity, Urine: 1.004 — ABNORMAL LOW (ref 1.005–1.030)
pH: 7 (ref 5.0–8.0)

## 2021-04-10 LAB — WET PREP, GENITAL
Clue Cells Wet Prep HPF POC: NONE SEEN
Sperm: NONE SEEN
Trich, Wet Prep: NONE SEEN
WBC, Wet Prep HPF POC: <10 (ref <10)
Yeast Wet Prep HPF POC: NONE SEEN

## 2021-04-10 NOTE — MAU Provider Note (Addendum)
History     CSN: [id number]  Arrival date and time: 04/10/21 0831   Event Date/Time   First Provider Initiated Contact with Patient 04/10/21 [phone]      Chief Complaint  Patient presents with   Vaginal Bleeding   HPI This is a 30yo  G3P1011 at [redacted]w[redacted]d with a low risk pregnancy seen for vaginal bleeding that started this morning. She had a bowel movement and had some rectal bleeding, but also had some vaginal bleeding when she wiped. She dropped her child off at school, then used the bathroom again and had more blood on the toilet paper when she wiped. No active bleeding on a pad. She did have some contractions, but these went away with some water. No palliating or provoking factors.   OB History     Gravida  3   Para  1   Term  1   Preterm      AB  1   Living  1      SAB  1   IAB      Ectopic      Multiple      Live Births  1           Past Medical History:  Diagnosis Date   Angio-edema    Asthma    Food allergy 03/22/2015   Moderate persistent asthma with acute exacerbation 03/09/2015   Recurrent pleurisy 07/19/2016   Recurrent upper respiratory infection (URI)    Urticaria     Past Surgical History:  Procedure Laterality Date   CESAREAN SECTION     CESAREAN SECTION  09/16/2010    Family History  Problem Relation Age of Onset   Asthma Maternal Grandmother    Lupus Mother    Healthy Father    Allergic rhinitis Neg Hx    Angioedema Neg Hx    Atopy Neg Hx    Eczema Neg Hx    Immunodeficiency Neg Hx    Urticaria Neg Hx     Social History   Tobacco Use   Smoking status: Never   Smokeless tobacco: Never  Vaping Use   Vaping Use: Never used  Substance Use Topics   Alcohol use: No    Alcohol/week: 0.0 standard drinks   Drug use: No    Allergies:  Allergies  Allergen Reactions   Effexor [Venlafaxine] Other (See Comments)    Shaky    Other Hives    DAIRY and WHEAT.  REACTION HIVES AND ITCHING.   Shellfish Allergy Itching and Swelling     Medications Prior to Admission  Medication Sig Dispense Refill Last Dose   albuterol (VENTOLIN HFA) 108 (90 Base) MCG/ACT inhaler Inhale 2 puffs every 4 hours as needed for cough, wheeze, tightness in chest, or shortness of breath. 18 g 1 Past Month   budesonide-formoterol (SYMBICORT) 160-4.5 MCG/ACT inhaler Inhale 2 puffs twice a day with a spacer to prevent cough or wheeze. Rinse mouth after use. 10.2 g 1 Past Month   cetirizine (ZYRTEC) 10 MG tablet Take 1 tablet (10 mg total) by mouth daily. 30 tablet 5 Past Month   Prenatal Vit-Fe Fumarate-FA ([facility] PRENATAL) 27-0.8 MG TABS Take 1 tablet by mouth daily.   04/10/2021 at 0740   albuterol (PROVENTIL) (2.5 MG/3ML) 0.083% nebulizer solution Take 3 mLs (2.5 mg total) by nebulization every 6 (six) hours as needed for wheezing or shortness of breath. 75 mL 0 More than a month   budesonide (RHINOCORT ALLERGY) 32 MCG/ACT nasal spray Place  2 sprays into both nostrils daily. 5 mL 5    EPINEPHrine 0.3 mg/0.3 mL IJ SOAJ injection Inject 1 mL into the muscle as needed.       Review of Systems Physical Exam   Blood pressure 104/73, pulse 95, temperature 98 F (36.7 C), temperature source Oral, resp. rate 18, height 5' (1.524 m), weight 93.8 kg, last menstrual period 08/09/2020, SpO2 99 %.  Physical Exam Vitals reviewed. Exam conducted with a chaperone present.  Constitutional:      Appearance: Normal appearance.  Cardiovascular:     Rate and Rhythm: Normal rate.     Pulses: Normal pulses.  Pulmonary:     Effort: Pulmonary effort is normal.  Abdominal:     General: Abdomen is flat. There is no distension.     Palpations: Abdomen is soft.     Tenderness: There is no abdominal tenderness.  Genitourinary:    Labia:        Right: No rash or tenderness.        Left: No rash or tenderness.      Vagina: Normal.     Comments: Small amount of maroon blood in vaginal vault. Cervix closed.  Skin:    Capillary Refill: Capillary refill takes less  than 2 seconds.  Neurological:     General: No focal deficit present.     Mental Status: She is alert.  Psychiatric:        Mood and Affect: Mood normal.        Behavior: Behavior normal.        Thought Content: Thought content normal.        Judgment: Judgment normal.   Results for orders placed or performed during the hospital encounter of 04/10/21 (from the past 24 hour(s))  Wet prep, genital     Status: None   Collection Time: 04/10/21  9:43 AM   Specimen: Cervix; Genital  Result Value Ref Range   Yeast Wet Prep HPF POC NONE SEEN NONE SEEN   Trich, Wet Prep NONE SEEN NONE SEEN   Clue Cells Wet Prep HPF POC NONE SEEN NONE SEEN   WBC, Wet Prep HPF POC <10 <10   Sperm NONE SEEN   Urinalysis, Routine w reflex microscopic Urine, Clean Catch     Status: Abnormal   Collection Time: 04/10/21 10:20 AM  Result Value Ref Range   Color, Urine STRAW (A) YELLOW   APPearance CLEAR CLEAR   Specific Gravity, Urine 1.004 (L) 1.005 - 1.030   pH 7.0 5.0 - 8.0   Glucose, UA NEGATIVE NEGATIVE mg/dL   Hgb urine dipstick MODERATE (A) NEGATIVE   Bilirubin Urine NEGATIVE NEGATIVE   Ketones, ur NEGATIVE NEGATIVE mg/dL   Protein, ur NEGATIVE NEGATIVE mg/dL   Nitrite NEGATIVE NEGATIVE   Leukocytes,Ua NEGATIVE NEGATIVE   RBC / HPF 0-5 0 - 5 RBC/hpf   WBC, UA 0-5 0 - 5 WBC/hpf   Bacteria, UA RARE (A) NONE SEEN   Squamous Epithelial / LPF 0-5 0 - 5   Mucus PRESENT      MAU Course  Procedures NST:  Baseline: 135  Variability: moderate Accelerations: ++  Decelerations: none Contractions: occasional   MDM Check cultures  Will check [place].  Imaging: I independent reviewed the images of the Ultrasound. Transverse, back down. Normal placenta - no evidence of abruption.   Assessment and Plan   1. [redacted] weeks gestation of pregnancy   2. Vaginal bleeding   3. Third trimester bleeding  No active bleeding. Will discharge to home with return precautions - if decreased fetal movement or active  bleeding on pad.  I discussed patient with Dr Senaida Ores, who will arrange close follow up of patient.  Levie Heritage 04/10/2021, 9:46 AM

## 2021-04-10 NOTE — MAU Note (Signed)
Presents stating after having a BM this morning she noticed both VB and rectal bleeding with wiping.  Reports VB is bright red from both areas.  Denies LOF.  Endorses +FM.

## 2021-04-11 LAB — GC/CHLAMYDIA PROBE AMP (~~LOC~~) NOT AT ARMC
Chlamydia: NEGATIVE
Comment: NEGATIVE
Comment: NORMAL
Neisseria Gonorrhea: NEGATIVE

## 2021-04-17 LAB — OB RESULTS CONSOLE GBS: GBS: NEGATIVE

## 2021-04-17 NOTE — Progress Notes (Deleted)
Cardiology Office Note:    Date:  04/17/2021   ID:  Danielle Velez, DOB [birth year], MRN [id number]  PCP:  Danielle Ng, NP   [facility] Providers Cardiologist:  None { Click to update primary MD,subspecialty MD or APP then REFRESH:1}  *** Referring MD: Danielle Clines, MD   Chief Complaint/Reason for Referral:  Syncope    ASSESSMENT & PLAN:    Syncope and collapse All the patient's episodes seem to be associated with noxious stimulus and again likely represent vasovagal syncope.  I will place 3-day ZIO monitor on to evaluate further.  I will keep follow-up with me open-ended.        {Are you ordering a CV Procedure (e.g. stress test, cath, DCCV, TEE, etc)?   Press F2        :[id number]}   Dispo:  No follow-ups on file.      Medication Adjustments/Labs and Tests Ordered: Current medicines are reviewed at length with the patient today.  Concerns regarding medicines are outlined above.    Tests Ordered: No orders of the defined types were placed in this encounter.    Medication Changes: No orders of the defined types were placed in this encounter.    History of Present Illness:     The patient is a 30 y.o. female with the indicated medical history here for recommendations regarding syncope.  The patient was admitted to the hospital in October as she.  There was an issue with her pregnancy.  Fetal monitoring was appropriate.  There was concern the patient be had a seizure but had an MRI EEG and echocardiogram that were all within normal limits.  It was believed that her symptoms were due to vasovagal syncope.  She followed up with neurology recently and again the prevailing thought was that her symptoms were from vasovagal syncope.  At that visit she did report that her first episode was at the age of 76 when she was getting her hair curled.  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  She had no further episodes until March 2022  when she was sick but suffered no loss of consciousness.  On February 25, 2021 she was outside and felt quite hot and lightheaded and then passed out.  Because of this she was admitted to the emergency department for further evaluation especially since she did not believe her baby was moving.  She then had another syncopal episode on November 10 while cooking when she felt lightheaded and she passed out.       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     Allergies:    Effexor [venlafaxine], Other, and Shellfish allergy   Social History:   Social History   Tobacco Use   Smoking status: Never   Smokeless tobacco: Never  Vaping Use   Vaping Use: Never used  Substance Use Topics   Alcohol use: No    Alcohol/week: 0.0 standard drinks   Drug use: No     Family Hx: Family History  Problem Relation Age of Onset   Asthma Maternal Grandmother  Lupus Mother    Healthy Father    Allergic rhinitis Neg Hx    Angioedema Neg Hx    Atopy Neg Hx    Eczema Neg Hx    Immunodeficiency Neg Hx    Urticaria Neg Hx      Review of Systems:   Please see the history of present illness.    All other systems reviewed and are negative.  EKGs/Labs/Other Test Reviewed:    EKG:  EKG today demonstrates ***; prior EKGs demonstrate sinus rhythm  Prior CV studies:    ECHO COMPLETE WO IMAGING ENHANCING AGENT 02/27/2021  Narrative ECHOCARDIOGRAM REPORT    Patient Name:   Danielle Velez Date of Exam: 02/27/2021 Medical Rec #:  [id number]     Height:       60.0 in Accession #:    [id number]    Weight:       199.2 lb Date  of Birth:  [birth year]      BSA:          1.863 m Patient Age:    30 years      BP:           99/50 mmHg Patient Gender: F             HR:           73 bpm. Exam Location:  Inpatient  Procedure: 2D Echo, Color Doppler and Cardiac Doppler  Indications:    R55 Syncope  History:        Patient has no prior history of Echocardiogram examinations.  Sonographer:    Danielle Velez Senior RDCS Referring Phys: [id number] Danielle Velez   Sonographer Comments: [redacted] weeks pregnant at time of study IMPRESSIONS   1. Left ventricular ejection fraction, by estimation, is 60 to 65%. The left ventricle has normal function. The left ventricle has no regional wall motion abnormalities. Left ventricular diastolic parameters were normal. 2. Right ventricular systolic function is normal. The right ventricular size is normal. There is normal pulmonary artery systolic pressure. 3. The mitral valve is normal in structure. No evidence of mitral valve regurgitation. No evidence of mitral stenosis. 4. The aortic valve is normal in structure. Aortic valve regurgitation is not visualized. No aortic stenosis is present. 5. The inferior vena cava is normal in size with greater than 50% respiratory variability, suggesting right atrial pressure of 3 mmHg.  FINDINGS Left Ventricle: Left ventricular ejection fraction, by estimation, is 60 to 65%. The left ventricle has normal function. The left ventricle has no regional wall motion abnormalities. The left ventricular internal cavity size was normal in size. There is no left ventricular hypertrophy. Left ventricular diastolic parameters were normal.  Right Ventricle: The right ventricular size is normal. No increase in right ventricular wall thickness. Right ventricular systolic function is normal. There is normal pulmonary artery systolic pressure. The tricuspid regurgitant velocity is 1.59 m/s, and with an assumed right atrial pressure of 3 mmHg, the estimated right ventricular  systolic pressure is 13.1 mmHg.  Left Atrium: Left atrial size was normal in size.  Right Atrium: Right atrial size was normal in size.  Pericardium: There is no evidence of pericardial effusion.  Mitral Valve: The mitral valve is normal in structure. No evidence of mitral valve regurgitation. No evidence of mitral valve stenosis.  Tricuspid Valve: The tricuspid valve is normal in structure. Tricuspid valve regurgitation is trivial. No evidence of tricuspid stenosis.  Aortic Valve: The aortic valve is normal in structure. Aortic valve regurgitation  is not visualized. No aortic stenosis is present.  Pulmonic Valve: The pulmonic valve was normal in structure. Pulmonic valve regurgitation is not visualized. No evidence of pulmonic stenosis.  Aorta: The aortic root is normal in size and structure.  Venous: The inferior vena cava is normal in size with greater than 50% respiratory variability, suggesting right atrial pressure of 3 mmHg.  IAS/Shunts: No atrial level shunt detected by color flow Doppler.   LEFT VENTRICLE PLAX 2D LVIDd:         4.30 cm   Diastology LVIDs:         2.50 cm   LV e' medial:    13.20 cm/s LV PW:         0.90 cm   LV E/e' medial:  9.0 LV IVS:        0.80 cm   LV e' lateral:   14.50 cm/s LVOT diam:     1.70 cm   LV E/e' lateral: 8.2 LV SV:         77 LV SV Index:   41 LVOT Area:     2.27 cm   RIGHT VENTRICLE RV S prime:     11.90 cm/s TAPSE (M-mode): 2.2 cm  LEFT ATRIUM             Index        RIGHT ATRIUM           Index LA diam:        2.50 cm 1.34 cm/m   RA Area:     18.00 cm LA Vol (A2C):   52.8 ml 28.33 ml/m  RA Volume:   55.30 ml  29.68 ml/m LA Vol (A4C):   31.9 ml 17.12 ml/m LA Biplane Vol: 42.1 ml 22.59 ml/m AORTIC VALVE LVOT Vmax:   152.00 cm/s LVOT Vmean:  107.000 cm/s LVOT VTI:    0.338 m  AORTA Ao Root diam: 2.30 cm Ao Asc diam:  2.40 cm  MITRAL VALVE                TRICUSPID VALVE MV Area (PHT): 3.21 cm     TR Peak grad:    10.1 mmHg MV Decel Time: 236 msec     TR Vmax:        159.00 cm/s MV E velocity: 119.00 cm/s MV A velocity: 89.70 cm/s   SHUNTS MV E/A ratio:  1.33         Systemic VTI:  0.34 m Systemic Diam: 1.70 cm  Donato Schultz MD Electronically signed by Donato Schultz MD Signature Date/Time: 02/27/2021/4:15:49 PM    Final     Imaging studies that I have independently reviewed today: ***  Recent Labs: 02/26/2021: TSH 2.342 03/23/2021: ALT 19; BUN 8; Creatinine, Ser 0.51; Hemoglobin 10.2; Platelets 237; Potassium 3.5; Sodium 135   Recent Lipid Panel No results found for: CHOL, TRIG, HDL, LDLCALC, LDLDIRECT   Risk Assessment/Calculations:    {Does this patient have ATRIAL FIBRILLATION?:[phone]}      Physical Exam:    VS:  LMP 08/09/2020    Wt Readings from Last 3 Encounters:  04/10/21 206 lb 14.4 oz (93.8 kg)  03/29/21 206 lb (93.4 kg)  02/16/21 199 lb 3.2 oz (90.4 kg)    GENERAL:  No apparent distress, AOx3 HEENT:  No carotid bruits, +2 carotid impulses, no scleral icterus CAR: RRR Irregular RR*** no murmurs***, gallops, rubs, or thrills RES:  Clear to auscultation bilaterally ABD:  Soft, nontender, nondistended, positive bowel sounds x 4  VASC:  +2 radial pulses, +2 carotid pulses, palpable pedal pulses NEURO:  CN 2-12 grossly intact; motor and sensory grossly intact PSYCH:  No active depression or anxiety EXT:  No edema, ecchymosis, or cyanosis    Signed, Danielle Pyo, MD  04/17/2021 12:41 PM    [facility] [address] [place], [place], [place]  [address] Phone: [phone]; Fax: [phone]

## 2021-04-18 ENCOUNTER — Other Ambulatory Visit: Payer: Self-pay | Admitting: Allergy & Immunology

## 2021-04-18 ENCOUNTER — Other Ambulatory Visit: Payer: Self-pay | Admitting: Obstetrics and Gynecology

## 2021-04-18 DIAGNOSIS — O283 Abnormal ultrasonic finding on antenatal screening of mother: Secondary | ICD-10-CM

## 2021-04-19 ENCOUNTER — Ambulatory Visit: Payer: 59 | Admitting: Internal Medicine

## 2021-04-19 DIAGNOSIS — R55 Syncope and collapse: Secondary | ICD-10-CM

## 2021-04-21 ENCOUNTER — Other Ambulatory Visit: Payer: Self-pay | Admitting: Obstetrics and Gynecology

## 2021-04-21 ENCOUNTER — Encounter (HOSPITAL_COMMUNITY): Payer: Self-pay | Admitting: Obstetrics and Gynecology

## 2021-04-21 ENCOUNTER — Inpatient Hospital Stay (HOSPITAL_COMMUNITY): Admission: AD | Admit: 2021-04-21 | Payer: 59 | Source: Home / Self Care | Admitting: Obstetrics and Gynecology

## 2021-04-21 ENCOUNTER — Ambulatory Visit: Payer: 59 | Admitting: *Deleted

## 2021-04-21 ENCOUNTER — Inpatient Hospital Stay (HOSPITAL_COMMUNITY)
Admission: AD | Admit: 2021-04-21 | Discharge: 2021-04-21 | Disposition: A | Discharge status: Home / Self Care | Payer: 59 | Attending: Obstetrics and Gynecology | Admitting: Obstetrics and Gynecology

## 2021-04-21 ENCOUNTER — Other Ambulatory Visit: Payer: Self-pay

## 2021-04-21 ENCOUNTER — Ambulatory Visit (HOSPITAL_BASED_OUTPATIENT_CLINIC_OR_DEPARTMENT_OTHER): Payer: 59

## 2021-04-21 VITALS — BP 98/65 | HR 105

## 2021-04-21 DIAGNOSIS — O34219 Maternal care for unspecified type scar from previous cesarean delivery: Secondary | ICD-10-CM | POA: Insufficient documentation

## 2021-04-21 DIAGNOSIS — O283 Abnormal ultrasonic finding on antenatal screening of mother: Secondary | ICD-10-CM | POA: Insufficient documentation

## 2021-04-21 DIAGNOSIS — E669 Obesity, unspecified: Secondary | ICD-10-CM | POA: Diagnosis not present

## 2021-04-21 DIAGNOSIS — O4693 Antepartum hemorrhage, unspecified, third trimester: Secondary | ICD-10-CM | POA: Diagnosis not present

## 2021-04-21 DIAGNOSIS — Z3689 Encounter for other specified antenatal screening: Secondary | ICD-10-CM | POA: Insufficient documentation

## 2021-04-21 DIAGNOSIS — O99891 Other specified diseases and conditions complicating pregnancy: Secondary | ICD-10-CM | POA: Diagnosis not present

## 2021-04-21 DIAGNOSIS — Z3A36 36 weeks gestation of pregnancy: Secondary | ICD-10-CM

## 2021-04-21 DIAGNOSIS — O99013 Anemia complicating pregnancy, third trimester: Secondary | ICD-10-CM | POA: Diagnosis not present

## 2021-04-21 DIAGNOSIS — O99213 Obesity complicating pregnancy, third trimester: Secondary | ICD-10-CM | POA: Insufficient documentation

## 2021-04-21 DIAGNOSIS — O9A213 Injury, poisoning and certain other consequences of external causes complicating pregnancy, third trimester: Secondary | ICD-10-CM

## 2021-04-21 DIAGNOSIS — O358XX0 Maternal care for other (suspected) fetal abnormality and damage, not applicable or unspecified: Secondary | ICD-10-CM | POA: Diagnosis not present

## 2021-04-21 DIAGNOSIS — R109 Unspecified abdominal pain: Secondary | ICD-10-CM

## 2021-04-21 DIAGNOSIS — D573 Sickle-cell trait: Secondary | ICD-10-CM | POA: Diagnosis not present

## 2021-04-21 DIAGNOSIS — O4703 False labor before 37 completed weeks of gestation, third trimester: Secondary | ICD-10-CM | POA: Diagnosis not present

## 2021-04-21 LAB — URINALYSIS, ROUTINE W REFLEX MICROSCOPIC
Bilirubin Urine: NEGATIVE
Glucose, UA: NEGATIVE mg/dL
Hgb urine dipstick: NEGATIVE
Ketones, ur: NEGATIVE mg/dL
Leukocytes,Ua: NEGATIVE
Nitrite: NEGATIVE
Protein, ur: NEGATIVE mg/dL
Specific Gravity, Urine: 1.011 (ref 1.005–1.030)
pH: 6 (ref 5.0–8.0)

## 2021-04-21 NOTE — MAU Note (Signed)
Pt reports ctx all day more intense the past 2-3 hours. Denies any vag bleeding or leaking . Reports a clear mucusy discharge. Good fetal movement felt.

## 2021-04-21 NOTE — MAU Provider Note (Signed)
S: [name] is a 30 y.o. G3P1011 at [redacted]w[redacted]d  who presents to MAU today for labor evaluation.   Nurse reports cervix remains unchanged (closed) after 1.5 hour evaluation.   Cervical exam by RN:  Dilation: Closed Effacement (%): 40 Cervical Position: Posterior Station: Ballotable Exam by:: K.Wilson,RN  Fetal Monitoring: Baseline: 150 Variability: Moderate Accelerations: Present Decelerations: None Contractions: None graphed  MDM Discussed patient with RN. NST reviewed.   A: SIUP at [redacted]w[redacted]d  False labor Cat I FT  P: Discharge home Labor precautions and kick counts included in AVS Patient to follow-up with primary office as scheduled  Patient may return to MAU as needed or when in labor   Gerrit Heck, [place] 04/21/2021 11:35 PM

## 2021-04-21 NOTE — MAU Note (Signed)
I have communicated with J.Emly,CNM and reviewed vital signs:  Vitals:   04/21/21 2130  BP: (!) 105/57  Pulse: 78  Resp: 18  Temp: 98.5 F (36.9 C)    Vaginal exam:  Dilation: Closed Effacement (%): 40 Cervical Position: Posterior Station: Ballotable Exam by:: K.Sion Reinders,RN,   Also reviewed contraction pattern and that non-stress test is reactive.  It has been documented that patient is contracting every 4-8 minutes with no cervical change over 1.5  hours not indicating active labor.  Patient denies any other complaints.  Based on this report provider has given order for discharge.  A discharge order and diagnosis entered by a provider.   Labor discharge instructions reviewed with patient.

## 2021-05-01 NOTE — Patient Instructions (Addendum)
Danielle Velez  05/01/2021   Your procedure is scheduled on:  05/09/2021  Arrive at 0530 at Entrance C on [employer] [address] at [facility]  and [employer]. You are invited to use the FREE valet parking or use the Visitor's parking deck.  Pick up the phone at the desk and dial [phone].  Call this number if you have problems the morning of surgery: [phone]  Remember:   Do not eat food:(After Midnight) Desps de medianoche.  Do not drink clear liquids: (After Midnight) Desps de medianoche.  Take these medicines the morning of surgery with A SIP OF WATER:   bring your inhalers with you   Do not wear jewelry, make-up or nail polish.  Do not wear lotions, powders, or perfumes. Do not wear deodorant.  Do not shave 48 hours prior to surgery.  Do not bring valuables to the hospital.  [facility] is not   responsible for any belongings or valuables brought to the hospital.  Contacts, dentures or bridgework may not be worn into surgery.  Leave suitcase in the car. After surgery it may be brought to your room.  For patients admitted to the hospital, checkout time is 11:00 AM the day of              discharge.      Please read over the following fact sheets that you were given:     Preparing for Surgery

## 2021-05-02 ENCOUNTER — Encounter (HOSPITAL_COMMUNITY): Payer: Self-pay

## 2021-05-04 ENCOUNTER — Ambulatory Visit: Payer: 59

## 2021-05-04 ENCOUNTER — Other Ambulatory Visit: Payer: 59

## 2021-05-08 ENCOUNTER — Encounter (HOSPITAL_COMMUNITY): Payer: Self-pay | Admitting: Obstetrics and Gynecology

## 2021-05-08 ENCOUNTER — Other Ambulatory Visit (HOSPITAL_COMMUNITY)
Admission: RE | Admit: 2021-05-08 | Discharge: 2021-05-08 | Disposition: A | Discharge status: Home / Self Care | Payer: Medicaid Other | Source: Ambulatory Visit | Attending: Obstetrics and Gynecology | Admitting: Obstetrics and Gynecology

## 2021-05-08 ENCOUNTER — Other Ambulatory Visit: Payer: Self-pay

## 2021-05-08 DIAGNOSIS — Z3A38 38 weeks gestation of pregnancy: Secondary | ICD-10-CM | POA: Insufficient documentation

## 2021-05-08 DIAGNOSIS — O0993 Supervision of high risk pregnancy, unspecified, third trimester: Secondary | ICD-10-CM | POA: Insufficient documentation

## 2021-05-08 LAB — TYPE AND SCREEN
ABO/RH(D): B POS
Antibody Screen: NEGATIVE

## 2021-05-08 LAB — CBC
HCT: 32.9 % — ABNORMAL LOW (ref 36.0–46.0)
Hemoglobin: 10.9 g/dL — ABNORMAL LOW (ref 12.0–15.0)
MCH: 27.3 pg (ref 26.0–34.0)
MCHC: 33.1 g/dL (ref 30.0–36.0)
MCV: 82.3 fL (ref 80.0–100.0)
Platelets: 246 10*3/uL (ref 150–400)
RBC: 4 MIL/uL (ref 3.87–5.11)
RDW: 14.4 % (ref 11.5–15.5)
WBC: 6.8 10*3/uL (ref 4.0–10.5)
nRBC: 0 % (ref 0.0–0.2)

## 2021-05-09 ENCOUNTER — Encounter (HOSPITAL_COMMUNITY)
Admission: AD | Disposition: A | Discharge status: Home / Self Care | Payer: Self-pay | Source: Home / Self Care | Attending: Obstetrics and Gynecology

## 2021-05-09 ENCOUNTER — Inpatient Hospital Stay (HOSPITAL_COMMUNITY): Payer: Medicaid Other | Admitting: Anesthesiology

## 2021-05-09 ENCOUNTER — Encounter (HOSPITAL_COMMUNITY): Payer: Self-pay | Admitting: Obstetrics and Gynecology

## 2021-05-09 ENCOUNTER — Other Ambulatory Visit: Payer: Self-pay

## 2021-05-09 ENCOUNTER — Inpatient Hospital Stay (HOSPITAL_COMMUNITY)
Admission: AD | Admit: 2021-05-09 | Discharge: 2021-05-11 | DRG: 787 | Disposition: A | Discharge status: Home / Self Care | Payer: Medicaid Other | Attending: Obstetrics and Gynecology | Admitting: Obstetrics and Gynecology

## 2021-05-09 DIAGNOSIS — J45909 Unspecified asthma, uncomplicated: Secondary | ICD-10-CM | POA: Diagnosis present

## 2021-05-09 DIAGNOSIS — A6000 Herpesviral infection of urogenital system, unspecified: Secondary | ICD-10-CM | POA: Diagnosis present

## 2021-05-09 DIAGNOSIS — Z98891 History of uterine scar from previous surgery: Secondary | ICD-10-CM

## 2021-05-09 DIAGNOSIS — Z3A39 39 weeks gestation of pregnancy: Secondary | ICD-10-CM | POA: Diagnosis not present

## 2021-05-09 DIAGNOSIS — D573 Sickle-cell trait: Secondary | ICD-10-CM | POA: Diagnosis present

## 2021-05-09 DIAGNOSIS — O9902 Anemia complicating childbirth: Secondary | ICD-10-CM | POA: Diagnosis present

## 2021-05-09 DIAGNOSIS — O9832 Other infections with a predominantly sexual mode of transmission complicating childbirth: Secondary | ICD-10-CM | POA: Diagnosis present

## 2021-05-09 DIAGNOSIS — O34211 Maternal care for low transverse scar from previous cesarean delivery: Principal | ICD-10-CM | POA: Diagnosis present

## 2021-05-09 DIAGNOSIS — O9952 Diseases of the respiratory system complicating childbirth: Secondary | ICD-10-CM | POA: Diagnosis present

## 2021-05-09 DIAGNOSIS — O99214 Obesity complicating childbirth: Secondary | ICD-10-CM | POA: Diagnosis present

## 2021-05-09 HISTORY — DX: History of uterine scar from previous surgery: Z98.891

## 2021-05-09 HISTORY — DX: Depression, unspecified: F32.A

## 2021-05-09 HISTORY — DX: Herpesviral infection, unspecified: B00.9

## 2021-05-09 HISTORY — DX: Hypotension, unspecified: I95.9

## 2021-05-09 HISTORY — DX: Unspecified convulsions: R56.9

## 2021-05-09 LAB — RPR: RPR Ser Ql: NONREACTIVE

## 2021-05-09 SURGERY — Surgical Case
Anesthesia: Spinal | Wound class: Clean Contaminated

## 2021-05-09 MED ORDER — NALOXONE HCL 0.4 MG/ML IJ SOLN: 0.4000 mg | INTRAMUSCULAR | Status: DC | PRN

## 2021-05-09 MED ORDER — DIBUCAINE (PERIANAL) 1 % EX OINT: 1.0000 "application " | TOPICAL_OINTMENT | CUTANEOUS | Status: DC | PRN

## 2021-05-09 MED ORDER — PHENYLEPHRINE HCL-NACL 20-0.9 MG/250ML-% IV SOLN
INTRAVENOUS | Status: DC | PRN
  Administered 2021-05-09: 60 ug/min via INTRAVENOUS

## 2021-05-09 MED ORDER — MORPHINE SULFATE (PF) 0.5 MG/ML IJ SOLN
INTRAMUSCULAR | Status: AC
  Filled 2021-05-09: qty 10

## 2021-05-09 MED ORDER — FENTANYL CITRATE (PF) 100 MCG/2ML IJ SOLN
INTRAMUSCULAR | Status: DC | PRN
  Administered 2021-05-09: 08:00:00 15 ug via INTRATHECAL

## 2021-05-09 MED ORDER — TETANUS-DIPHTH-ACELL PERTUSSIS 5-2.5-18.5 LF-MCG/0.5 IM SUSY: 0.5000 mL | PREFILLED_SYRINGE | Freq: Once | INTRAMUSCULAR | Status: DC

## 2021-05-09 MED ORDER — LORATADINE 10 MG PO TABS
10.0000 mg | ORAL_TABLET | Freq: Every day | ORAL | Status: DC
  Administered 2021-05-09 – 2021-05-10 (×2): 10 mg via ORAL
  Filled 2021-05-09 (×2): qty 1

## 2021-05-09 MED ORDER — FENTANYL CITRATE (PF) 100 MCG/2ML IJ SOLN: INTRAMUSCULAR | Status: DC | PRN

## 2021-05-09 MED ORDER — LACTATED RINGERS IV SOLN: INTRAVENOUS | Status: DC

## 2021-05-09 MED ORDER — KETOROLAC TROMETHAMINE 30 MG/ML IJ SOLN
30.0000 mg | Freq: Four times a day (QID) | INTRAMUSCULAR | Status: DC | PRN
  Filled 2021-05-09: qty 1

## 2021-05-09 MED ORDER — WITCH HAZEL-GLYCERIN EX PADS: 1.0000 "application " | MEDICATED_PAD | CUTANEOUS | Status: DC | PRN

## 2021-05-09 MED ORDER — FENTANYL CITRATE (PF) 100 MCG/2ML IJ SOLN
INTRAMUSCULAR | Status: AC
  Filled 2021-05-09: qty 2

## 2021-05-09 MED ORDER — OXYCODONE HCL 5 MG/5ML PO SOLN: 5.0000 mg | Freq: Once | ORAL | Status: DC | PRN

## 2021-05-09 MED ORDER — OXYTOCIN-SODIUM CHLORIDE 30-0.9 UT/500ML-% IV SOLN
INTRAVENOUS | Status: AC
  Filled 2021-05-09: qty 500

## 2021-05-09 MED ORDER — MORPHINE SULFATE (PF) 0.5 MG/ML IJ SOLN
INTRAMUSCULAR | Status: DC | PRN
  Administered 2021-05-09: 08:00:00 150 ug via INTRATHECAL

## 2021-05-09 MED ORDER — DIPHENHYDRAMINE HCL 25 MG PO CAPS
25.0000 mg | ORAL_CAPSULE | Freq: Four times a day (QID) | ORAL | Status: DC | PRN
  Filled 2021-05-09: qty 1

## 2021-05-09 MED ORDER — MEPERIDINE HCL 25 MG/ML IJ SOLN: 6.2500 mg | INTRAMUSCULAR | Status: DC | PRN

## 2021-05-09 MED ORDER — NALOXONE HCL 4 MG/10ML IJ SOLN
1.0000 ug/kg/h | INTRAVENOUS | Status: DC | PRN
  Filled 2021-05-09: qty 5

## 2021-05-09 MED ORDER — DEXAMETHASONE SODIUM PHOSPHATE 4 MG/ML IJ SOLN
INTRAMUSCULAR | Status: AC
  Filled 2021-05-09: qty 1

## 2021-05-09 MED ORDER — OXYTOCIN-SODIUM CHLORIDE 30-0.9 UT/500ML-% IV SOLN: 2.5000 [IU]/h | INTRAVENOUS | Status: DC

## 2021-05-09 MED ORDER — ATROPINE SULFATE 0.4 MG/ML IV SOLN
INTRAVENOUS | Status: AC
  Filled 2021-05-09: qty 1

## 2021-05-09 MED ORDER — FENTANYL CITRATE (PF) 100 MCG/2ML IJ SOLN
INTRAMUSCULAR | Status: DC | PRN
Start: 2021-05-09 — End: 2021-05-09
  Administered 2021-05-09: 08:00:00 85 ug via INTRAVENOUS
  Administered 2021-05-09: 09:00:00 50 ug via INTRAVENOUS

## 2021-05-09 MED ORDER — PRENATAL MULTIVITAMIN CH
1.0000 | ORAL_TABLET | Freq: Every day | ORAL | Status: DC
  Administered 2021-05-09 – 2021-05-10 (×2): 1 via ORAL
  Filled 2021-05-09 (×2): qty 1

## 2021-05-09 MED ORDER — OXYTOCIN-SODIUM CHLORIDE 30-0.9 UT/500ML-% IV SOLN
INTRAVENOUS | Status: DC | PRN
  Administered 2021-05-09: 200 mL via INTRAVENOUS

## 2021-05-09 MED ORDER — DIPHENHYDRAMINE HCL 25 MG PO CAPS
25.0000 mg | ORAL_CAPSULE | ORAL | Status: DC | PRN
  Administered 2021-05-09: 19:00:00 25 mg via ORAL
  Filled 2021-05-09: qty 1

## 2021-05-09 MED ORDER — COCONUT OIL OIL: 1.0000 "application " | TOPICAL_OIL | Status: DC | PRN

## 2021-05-09 MED ORDER — MENTHOL 3 MG MT LOZG: 1.0000 | LOZENGE | OROMUCOSAL | Status: DC | PRN

## 2021-05-09 MED ORDER — SODIUM CHLORIDE 0.9% FLUSH: 3.0000 mL | INTRAVENOUS | Status: DC | PRN

## 2021-05-09 MED ORDER — ONDANSETRON HCL 4 MG/2ML IJ SOLN
INTRAMUSCULAR | Status: DC | PRN
  Administered 2021-05-09: 4 mg via INTRAVENOUS

## 2021-05-09 MED ORDER — ONDANSETRON HCL 4 MG/2ML IJ SOLN: 4.0000 mg | Freq: Three times a day (TID) | INTRAMUSCULAR | Status: DC | PRN

## 2021-05-09 MED ORDER — PROMETHAZINE HCL 25 MG/ML IJ SOLN: 6.2500 mg | INTRAMUSCULAR | Status: DC | PRN

## 2021-05-09 MED ORDER — ATROPINE SULFATE 0.4 MG/ML IV SOLN
INTRAVENOUS | Status: DC | PRN
  Administered 2021-05-09: .2 mg via INTRAVENOUS

## 2021-05-09 MED ORDER — FERROUS SULFATE 325 (65 FE) MG PO TABS
325.0000 mg | ORAL_TABLET | Freq: Two times a day (BID) | ORAL | Status: DC
  Administered 2021-05-09 – 2021-05-10 (×3): 325 mg via ORAL
  Filled 2021-05-09 (×3): qty 1

## 2021-05-09 MED ORDER — MORPHINE SULFATE (PF) 0.5 MG/ML IJ SOLN
INTRAMUSCULAR | Status: DC | PRN
  Administered 2021-05-09: 150 ug via INTRATHECAL

## 2021-05-09 MED ORDER — HYDROMORPHONE HCL 1 MG/ML IJ SOLN: 0.2500 mg | INTRAMUSCULAR | Status: DC | PRN

## 2021-05-09 MED ORDER — KETOROLAC TROMETHAMINE 30 MG/ML IJ SOLN
30.0000 mg | Freq: Four times a day (QID) | INTRAMUSCULAR | Status: DC | PRN
  Administered 2021-05-09 (×3): 30 mg via INTRAVENOUS
  Filled 2021-05-09: qty 1

## 2021-05-09 MED ORDER — CEFAZOLIN SODIUM-DEXTROSE 2-4 GM/100ML-% IV SOLN
INTRAVENOUS | Status: AC
  Filled 2021-05-09: qty 100

## 2021-05-09 MED ORDER — STERILE WATER FOR IRRIGATION IR SOLN
Status: DC | PRN
  Administered 2021-05-09: 1000 mL

## 2021-05-09 MED ORDER — DIPHENHYDRAMINE HCL 50 MG/ML IJ SOLN: 12.5000 mg | INTRAMUSCULAR | Status: DC | PRN

## 2021-05-09 MED ORDER — FENTANYL CITRATE (PF) 100 MCG/2ML IJ SOLN
INTRAMUSCULAR | Status: DC | PRN
  Administered 2021-05-09: 15 ug via INTRATHECAL

## 2021-05-09 MED ORDER — ACETAMINOPHEN 10 MG/ML IV SOLN: 1000.0000 mg | Freq: Once | INTRAVENOUS | Status: DC | PRN

## 2021-05-09 MED ORDER — ONDANSETRON HCL 4 MG/2ML IJ SOLN
INTRAMUSCULAR | Status: AC
  Filled 2021-05-09: qty 2

## 2021-05-09 MED ORDER — KETOROLAC TROMETHAMINE 30 MG/ML IJ SOLN
INTRAMUSCULAR | Status: AC
  Filled 2021-05-09: qty 1

## 2021-05-09 MED ORDER — ACETAMINOPHEN 10 MG/ML IV SOLN
INTRAVENOUS | Status: AC
  Filled 2021-05-09: qty 100

## 2021-05-09 MED ORDER — CEFAZOLIN SODIUM-DEXTROSE 2-4 GM/100ML-% IV SOLN
2.0000 g | Freq: Once | INTRAVENOUS | Status: AC
  Administered 2021-05-09: 08:00:00 2 g via INTRAVENOUS

## 2021-05-09 MED ORDER — SENNOSIDES-DOCUSATE SODIUM 8.6-50 MG PO TABS
2.0000 | ORAL_TABLET | Freq: Every day | ORAL | Status: DC
  Administered 2021-05-10: 09:00:00 2 via ORAL
  Filled 2021-05-09: qty 2

## 2021-05-09 MED ORDER — SCOPOLAMINE 1 MG/3DAYS TD PT72
1.0000 | MEDICATED_PATCH | Freq: Once | TRANSDERMAL | Status: DC
  Administered 2021-05-09: 11:00:00 1.5 mg via TRANSDERMAL
  Filled 2021-05-09: qty 1

## 2021-05-09 MED ORDER — BUPIVACAINE IN DEXTROSE 0.75-8.25 % IT SOLN
INTRATHECAL | Status: DC | PRN
  Administered 2021-05-09: 1.6 mL via INTRATHECAL

## 2021-05-09 MED ORDER — SODIUM CHLORIDE 0.9 % IR SOLN
Status: DC | PRN
  Administered 2021-05-09: 1000 mL

## 2021-05-09 MED ORDER — ACETAMINOPHEN 500 MG PO TABS
1000.0000 mg | ORAL_TABLET | Freq: Four times a day (QID) | ORAL | Status: DC
  Administered 2021-05-09 – 2021-05-11 (×7): 1000 mg via ORAL
  Filled 2021-05-09 (×8): qty 2

## 2021-05-09 MED ORDER — OXYCODONE HCL 5 MG PO TABS
5.0000 mg | ORAL_TABLET | ORAL | Status: DC | PRN
  Administered 2021-05-10: 12:00:00 5 mg via ORAL
  Administered 2021-05-10 – 2021-05-11 (×3): 10 mg via ORAL
  Filled 2021-05-09 (×2): qty 2
  Filled 2021-05-09: qty 1
  Filled 2021-05-09: qty 2

## 2021-05-09 MED ORDER — PHENYLEPHRINE HCL-NACL 20-0.9 MG/250ML-% IV SOLN
INTRAVENOUS | Status: AC
  Filled 2021-05-09: qty 250

## 2021-05-09 MED ORDER — DEXAMETHASONE SODIUM PHOSPHATE 4 MG/ML IJ SOLN
INTRAMUSCULAR | Status: DC | PRN
  Administered 2021-05-09: 4 mg via INTRAVENOUS

## 2021-05-09 MED ORDER — ZOLPIDEM TARTRATE 5 MG PO TABS: 5.0000 mg | ORAL_TABLET | Freq: Every evening | ORAL | Status: DC | PRN

## 2021-05-09 MED ORDER — IBUPROFEN 600 MG PO TABS
600.0000 mg | ORAL_TABLET | Freq: Four times a day (QID) | ORAL | Status: DC
  Administered 2021-05-10 – 2021-05-11 (×5): 600 mg via ORAL
  Filled 2021-05-09 (×5): qty 1

## 2021-05-09 MED ORDER — SIMETHICONE 80 MG PO CHEW
80.0000 mg | CHEWABLE_TABLET | Freq: Three times a day (TID) | ORAL | Status: DC
  Administered 2021-05-09 – 2021-05-10 (×4): 80 mg via ORAL
  Filled 2021-05-09 (×5): qty 1

## 2021-05-09 MED ORDER — OXYCODONE HCL 5 MG PO TABS: 5.0000 mg | ORAL_TABLET | Freq: Once | ORAL | Status: DC | PRN

## 2021-05-09 MED ORDER — MOMETASONE FURO-FORMOTEROL FUM 200-5 MCG/ACT IN AERO
2.0000 | INHALATION_SPRAY | Freq: Two times a day (BID) | RESPIRATORY_TRACT | Status: DC
  Filled 2021-05-09: qty 8.8

## 2021-05-09 MED ORDER — POVIDONE-IODINE 10 % EX SWAB: 2.0000 "application " | Freq: Once | CUTANEOUS | Status: DC

## 2021-05-09 MED ORDER — SIMETHICONE 80 MG PO CHEW
80.0000 mg | CHEWABLE_TABLET | ORAL | Status: DC | PRN
  Administered 2021-05-09 – 2021-05-10 (×2): 80 mg via ORAL
  Filled 2021-05-09 (×2): qty 1

## 2021-05-09 MED ORDER — ACETAMINOPHEN 10 MG/ML IV SOLN
INTRAVENOUS | Status: DC | PRN
  Administered 2021-05-09: 1000 mg via INTRAVENOUS

## 2021-05-09 SURGICAL SUPPLY — 41 items
BENZOIN TINCTURE PRP APPL 2/3 (GAUZE/BANDAGES/DRESSINGS) ×1 IMPLANT
CHLORAPREP W/TINT 26ML (MISCELLANEOUS) ×2 IMPLANT
CLAMP CORD UMBIL (MISCELLANEOUS) IMPLANT
CLOTH BEACON ORANGE TIMEOUT ST (SAFETY) ×2 IMPLANT
DRAPE C SECTION CLR SCREEN (DRAPES) ×2 IMPLANT
DRSG OPSITE POSTOP 4X10 (GAUZE/BANDAGES/DRESSINGS) ×2 IMPLANT
ELECT REM PT RETURN 9FT ADLT (ELECTROSURGICAL) ×2
ELECTRODE REM PT RTRN 9FT ADLT (ELECTROSURGICAL) ×1 IMPLANT
EXTRACTOR VACUUM KIWI (MISCELLANEOUS) IMPLANT
GAUZE SPONGE 4X4 12PLY STRL LF (GAUZE/BANDAGES/DRESSINGS) ×2 IMPLANT
GLOVE BIO SURGEON STRL SZ 6.5 (GLOVE) ×2 IMPLANT
GLOVE BIOGEL PI IND STRL 7.0 (GLOVE) ×2 IMPLANT
GLOVE BIOGEL PI INDICATOR 7.0 (GLOVE) ×2
GOWN STRL REUS W/TWL LRG LVL3 (GOWN DISPOSABLE) ×4 IMPLANT
HEMOSTAT ARISTA ABSORB 3G PWDR (HEMOSTASIS) ×1 IMPLANT
KIT ABG SYR 3ML LUER SLIP (SYRINGE) IMPLANT
NDL HYPO 25X5/8 SAFETYGLIDE (NEEDLE) IMPLANT
NEEDLE HYPO 25X5/8 SAFETYGLIDE (NEEDLE) IMPLANT
NS IRRIG 1000ML POUR BTL (IV SOLUTION) ×2 IMPLANT
PACK C SECTION WH (CUSTOM PROCEDURE TRAY) ×2 IMPLANT
PAD ABD 7.5X8 STRL (GAUZE/BANDAGES/DRESSINGS) ×1 IMPLANT
PAD OB MATERNITY 4.3X12.25 (PERSONAL CARE ITEMS) ×2 IMPLANT
RETRACTOR TRAXI PANNICULUS (MISCELLANEOUS) IMPLANT
RETRACTOR WND ALEXIS 25 LRG (MISCELLANEOUS) ×1 IMPLANT
RTRCTR C-SECT PINK 25CM LRG (MISCELLANEOUS) IMPLANT
RTRCTR WOUND ALEXIS 25CM LRG (MISCELLANEOUS) ×2
STRIP CLOSURE SKIN 1/2X4 (GAUZE/BANDAGES/DRESSINGS) ×1 IMPLANT
SUT CHROMIC 1 CTX 36 (SUTURE) ×4 IMPLANT
SUT PLAIN 0 NONE (SUTURE) IMPLANT
SUT PLAIN 2 0 XLH (SUTURE) ×2 IMPLANT
SUT VIC AB 0 CT1 27 (SUTURE) ×2
SUT VIC AB 0 CT1 27XBRD ANBCTR (SUTURE) ×2 IMPLANT
SUT VIC AB 2-0 CT1 27 (SUTURE) ×2
SUT VIC AB 2-0 CT1 TAPERPNT 27 (SUTURE) ×1 IMPLANT
SUT VIC AB 3-0 CT1 27 (SUTURE)
SUT VIC AB 3-0 CT1 TAPERPNT 27 (SUTURE) IMPLANT
SUT VIC AB 4-0 KS 27 (SUTURE) ×2 IMPLANT
TOWEL OR 17X24 6PK STRL BLUE (TOWEL DISPOSABLE) ×2 IMPLANT
TRAXI PANNICULUS RETRACTOR (MISCELLANEOUS) ×1
TRAY FOLEY W/BAG SLVR 14FR LF (SET/KITS/TRAYS/PACK) ×2 IMPLANT
WATER STERILE IRR 1000ML POUR (IV SOLUTION) ×2 IMPLANT

## 2021-05-09 NOTE — Op Note (Signed)
Operative Note    Preoperative Diagnosis:  IUP at 39 1/7wks Previous cesarean section Declines TOLAC   Postoperative Diagnosis: Same   Procedure: Repeat low transverse cesarean section with double layered closure    Surgeon: Britt Bottom DO Assist: Nino Parsley MD  Anesthesia: Spinal Dr Darlin Drop   Fluids: LR EBL: UOP:   Findings: Viable female infant in vertex position, left arm presented first, loose nuchal x 1. APGARS 9,9, Weight 7lbs 4oz. Grossly normal uterus, tubes and ovaries.    Specimen: Placenta to L/D   Procedure Note  Consent verified pre-op. All questions answered   Patient was taken to the operating room where spinal anesthesia was administered. She was prepped and draped in the normal sterile fashion and placed in the dorsal supine position with a leftward tilt. An appropriate time out was performed. A Pfannenstiel skin incision was then made through the previous incision with the scalpel and carried through to the underlying layer of fascia by sharp dissection and Bovie cautery. The fascia was nicked in the midline and the incision was extended laterally with Mayo scissors. The superior and inferior aspects of the incision were grasped kocher clamps and dissected off the underlying rectus muscles.Rectus muscles were separated in the midline  and the peritoneal cavity entered bluntly. The peritoneal incision was then extended both superiorly and inferiorly with careful attention to avoid both bowel and bladder. The Alexis self-retaining wound retractor was then placed and the lower uterine segment exposed.  The lower uterine segment was then incised in a transverse fashion and the cavity itself entered bluntly. The incision was extended bluntly. Clear amniotic fluid was noted. The infants left arm was immediately visible; an attempt was made to return it into the uterine cavity. The infant's head was then lifted and delivered from the incision without  difficulty. Loose nuchal x 1 was reduced. Vigorous spontaneous cry was noted during delivery.  The remainder of the infant delivered and the nose and mouth were bulb suctioned. The cord was clamped and cut after a minute delay. The infant was handed off to the waiting NICU team. Cord blood was obtained and the placenta was then spontaneously delivered. The uterus was cleared of all clots and debris with a moist lap sponge. The uterine incision was then repaired in 2 layers the first layer was a running layer 1-0 chromic and the second an imbricating layer of the same suture. An area of bleeding in the mid segment was further suture ligated for hemostasis using 2-0 vicryl with great results. Arista was also applied liberally.  The tubes and ovaries were inspected and the gutters cleared of all clots and debris. All instruments and sponges were then removed from the abdomen. The omentum was dissection away from the peritoneum. The peritoneum was then reapproximated in a running fashion with sutures of 2-0 Vicryl.  The fascia was then closed with 0 Vicryl in a running fashion. The skin was closed with a subcuticular stitch of 4-0 Vicryl on a Keith needle and then reinforced with benzoin and Steri-Strips and a pressure dressing.  At the conclusion of the procedure all instruments and sponge counts were noted to be correct. Patient was taken to the recovery room in stable condition with her baby accompanying her skin to skin.    They will want a circumcision for baby

## 2021-05-09 NOTE — Transfer of Care (Signed)
Immediate Anesthesia Transfer of Care Note  Patient: Danielle Velez  Procedure(s) Performed: CESAREAN SECTION  Patient Location: PACU  Anesthesia Type:Spinal  Level of Consciousness: awake, alert  and oriented  Airway & Oxygen Therapy: Patient Spontanous Breathing  Post-op Assessment: Report given to RN and Post -op Vital signs reviewed and stable  Post vital signs: Reviewed and stable  Last Vitals:  Vitals Value Taken Time  BP 92/55 05/09/21 0904  Temp    Pulse 58 05/09/21 0907  Resp 15 05/09/21 0907  SpO2 100 % 05/09/21 0907  Vitals shown include unvalidated device data.  Last Pain:  Vitals:   05/09/21 0544  TempSrc: Oral  PainSc: 0-No pain         Complications: No notable events documented.

## 2021-05-09 NOTE — H&P (Addendum)
Danielle Velez is a 30 y.o.G97P1011 female presenting at 90 1/7wks for scheduled repeat cesarean section. She is dated per 5 week [place]. 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 She is GBS neg. She had neg materniT screen. OB History     Gravida  3   Para  1   Term  1   Preterm      AB  1   Living  1      SAB  1   IAB      Ectopic      Multiple      Live Births  1          Past Medical History:  Diagnosis Date   Angio-edema    Asthma    Depression    Food allergy 03/22/2015   HSV infection    Hypotension    with syncopal episodes per pt   Moderate persistent asthma with acute exacerbation 03/09/2015   Recurrent pleurisy 07/19/2016   Recurrent upper respiratory infection (URI)    Seizures (HCC)    as a child   Urticaria    Past Surgical History:  Procedure Laterality Date   CESAREAN SECTION     CESAREAN SECTION  09/16/2010   Family History: family history includes Asthma in her maternal grandmother; Diabetes in her maternal grandmother; Healthy in her father; Hypertension in her maternal grandmother; Lupus in her mother. Social History:  reports that she has never smoked. She has never used smokeless tobacco. She reports that she does not drink alcohol and does not use drugs.     Maternal Diabetes: No Genetic Screening: Normal Maternal Ultrasounds/Referrals: Normal Fetal Ultrasounds or other Referrals:  Referred to Materal Fetal Medicine Nl anatomy noted Maternal Substance Abuse:  No Significant Maternal Medications:  None Significant Maternal Lab Results:  Group B Strep negative Other Comments:  None  Review of Systems  Constitutional:  Positive for activity change.  Eyes:  Positive for photophobia. Negative for visual disturbance.  Respiratory:  Negative for chest tightness  and shortness of breath.   Cardiovascular:  Positive for leg swelling. Negative for chest pain and palpitations.  Gastrointestinal:  Positive for abdominal pain.  Genitourinary:  Positive for pelvic pain.  Musculoskeletal:  Positive for myalgias.  Neurological:  Negative for light-headedness and numbness.  Psychiatric/Behavioral:  The patient is nervous/anxious.   All other systems reviewed and are negative. Maternal Medical History:  Reason for admission: Scheduled repeat cesarean section  Contractions: Frequency: rare.   Perceived severity is mild.   Fetal activity: Perceived fetal activity is normal.   Prenatal complications: no prenatal complications Prenatal Complications - Diabetes: none.    Blood pressure 115/73, pulse 77, temperature 97.6 F (36.4 C), temperature source Oral, resp. rate 18, height 5' (1.524 m), weight 96.3 kg, last menstrual period 08/09/2020, SpO2 100 %. Maternal Exam:  Uterine Assessment: Contraction strength is mild.  Contraction frequency is rare.  Abdomen: Patient reports generalized tenderness.  Estimated fetal weight is AGA.   Fetal presentation: vertex Introitus: Normal vulva. Vulva is negative for condylomata and lesion.  Normal vagina.  Vagina is negative for condylomata and discharge.  Pelvis: of concern for delivery.   Cervix: Cervix evaluated by digital exam.     Fetal Exam Fetal Monitor Review: Baseline rate: 140.  Pattern: accelerations present and no decelerations.  Fetal State Assessment: Category I - tracings are normal.  Physical Exam Constitutional:      Appearance: Normal appearance.  Cardiovascular:     Rate and Rhythm: Normal rate.  Pulmonary:     Effort: Pulmonary effort is normal.  Abdominal:     Palpations: Abdomen is soft.     Tenderness: There is generalized abdominal tenderness.  Genitourinary:    General: Normal vulva.  Vulva is no lesion.     Vagina: No vaginal discharge.  Musculoskeletal:        General:  Normal range of motion.     Cervical back: Normal range of motion.  Skin:    General: Skin is warm and dry.     Capillary Refill: Capillary refill takes 2 to 3 seconds.  Neurological:     General: No focal deficit present.     Mental Status: She is alert and oriented to person, place, and time. Mental status is at baseline.  Psychiatric:        Mood and Affect: Mood normal.        Behavior: Behavior normal.        Thought Content: Thought content normal.        Judgment: Judgment normal.    Prenatal labs: ABO, Rh: --/--/B POS (12/26 6950) Antibody: NEG (12/26 0928) Rubella: Immune (06/09 0000) RPR: Nonreactive (06/09 0000)  HBsAg: Negative (06/09 0000)  HIV: NON-REACTIVE (04/29 0918)  GBS: Negative/-- (12/05 0000)   Assessment/Plan: 73UV [id number] female at 24 1/[redacted]wks gestation here for repeat c/s - Admit - Verify consent  - ERAS protocol - To OR when ready    Danielle Velez 05/09/2021, 7:21 AM

## 2021-05-09 NOTE — Anesthesia Postprocedure Evaluation (Signed)
Anesthesia Post Note  Patient: Danielle Velez  Procedure(s) Performed: CESAREAN SECTION     Patient location during evaluation: PACU Anesthesia Type: Spinal Level of consciousness: oriented and awake and alert Pain management: pain level controlled Vital Signs Assessment: post-procedure vital signs reviewed and stable Respiratory status: spontaneous breathing, respiratory function stable and nonlabored ventilation Cardiovascular status: blood pressure returned to baseline and stable Postop Assessment: no headache, no backache, no apparent nausea or vomiting and spinal receding Anesthetic complications: no   No notable events documented.  Last Vitals:  Vitals:   05/09/21 1005 05/09/21 1100  BP: (!) 94/59 (!) 101/57  Pulse: (!) 55 (!) 55  Resp: 16 16  Temp: (!) 36 C (!) 36.4 C  SpO2: 96% 97%    Last Pain:  Vitals:   05/09/21 1100  TempSrc: Axillary  PainSc:    Pain Goal:                   Lucretia Kern

## 2021-05-09 NOTE — Progress Notes (Signed)
°   05/09/21 1506  Vital Signs  Temp 97.6 F (36.4 C)  Orthostatic Lying   BP- Lying 94/68  Pulse- Lying 57  Orthostatic Sitting  BP- Sitting (!) 81/57  Pulse- Sitting 61  Orthostatic Standing at 0 minutes  BP- Standing at 0 minutes (!) 81/54  Pulse- Standing at 0 minutes 68   Intake 1200  Output 300 Dr Mindi Slicker and [facility] notified. Continue to monitor

## 2021-05-09 NOTE — Lactation Note (Signed)
This note was copied from a baby's chart. Lactation Consultation Note  Patient Name: Danielle Velez [id number] Date: 05/09/2021 Reason for consult: Initial assessment;Term Age:29 hours  LC in to visit with P2 Mom of term baby delivered by C/S.  Mom states baby latched during the surgery and then again in PACU.    Baby swaddled and latched to breast in cradle hold.  Noted that baby was sucking and pulling in with his cheeks.  Mom stated that her nipples were a little sore on initial latch.  Offered to help baby latch deeper.  Removed swaddle and placed baby more cross cradle in laid back position.  Pillow support placed under Mom's elbow and assisted her to support and sandwich her breast to accommodate a deeper latch to the breast.  Baby latched deeper with ease.  Encouraged STS with baby and offering the breast often with cues.  Mom to ask for help prn.  Maternal Data Has patient been taught Hand Expression?: Yes Does the patient have breastfeeding experience prior to this delivery?: Yes How long did the patient breastfeed?: 2 months  Feeding Mother's Current Feeding Choice: Breast Milk  LATCH Score Latch: Grasps breast easily, tongue down, lips flanged, rhythmical sucking.  Audible Swallowing: Spontaneous and intermittent  Type of Nipple: Everted at rest and after stimulation  Comfort (Breast/Nipple): Soft / non-tender  Hold (Positioning): Assistance needed to correctly position infant at breast and maintain latch.  LATCH Score: 9   Interventions Interventions: Breast feeding basics reviewed;Assisted with latch;Skin to skin;Breast massage;Breast compression;Adjust position;Support pillows;Position options;Expressed milk;LC Services brochure   Consult Status Consult Status: Follow-up Date: 05/10/21 Follow-up type: In-patient    [name] 05/09/2021, 1:07 PM

## 2021-05-09 NOTE — Social Work (Signed)
MOB was referred for history of depression and anxiety.   * Referral screened out by Clinical Social Worker because none of the following criteria appear to apply:  ~ History of anxiety/depression during this pregnancy, or of post-partum depression following prior delivery. ~ Diagnosis of anxiety and/or depression within last 3 years. OR * MOB's symptoms currently being treated with medication and/or therapy. MOB currently on Zoloft 50mg .  Please contact the Clinical Social Worker if needs arise, by [facility] request, or if MOB scores greater than 9/yes to question 10 on Edinburgh Postpartum Depression Screen.  [birth year]-20-1976, LCSWA Clinical Social Work [facility] [employer]  [phone]

## 2021-05-09 NOTE — Anesthesia Procedure Notes (Signed)
Spinal ° °Patient location during procedure: OR °Reason for block: surgical anesthesia °Staffing °Performed: anesthesiologist  °Anesthesiologist: Myeesha Shane E, MD °Preanesthetic Checklist °Completed: patient identified, IV checked, risks and benefits discussed, surgical consent, monitors and equipment checked, pre-op evaluation and timeout performed °Spinal Block °Patient position: sitting °Prep: DuraPrep and site prepped and draped °Patient monitoring: continuous pulse ox, blood pressure and heart rate °Approach: midline °Location: L3-4 °Injection technique: single-shot °Needle °Needle type: Pencan  °Needle gauge: 24 G °Needle length: 10 cm °Assessment °Events: CSF return °Additional Notes °Functioning IV was confirmed and monitors were applied. Sterile prep and drape, including hand hygiene and sterile gloves were used. The patient was positioned and the spine was prepped. The skin was anesthetized with lidocaine.  Free flow of clear CSF was obtained prior to injecting local anesthetic into the CSF. The needle was carefully withdrawn. The patient tolerated the procedure well.  ° ° ° °

## 2021-05-09 NOTE — Anesthesia Preprocedure Evaluation (Signed)
Anesthesia Evaluation  Patient identified by MRN, date of birth, ID band Patient awake    Reviewed: Allergy & Precautions, H&P , NPO status , Patient's Chart, lab work & pertinent test results  History of Anesthesia Complications Negative for: history of anesthetic complications  Airway Mallampati: II  TM Distance: >3 FB     Dental   Pulmonary asthma ,    Pulmonary exam normal        Cardiovascular negative cardio ROS   Rhythm:regular Rate:Normal   Echo 02/27/21: EF 60-65%, no RWMA, normal diastolic fn, nl RVSF, nl PASP, normal valves   Neuro/Psych Depression negative neurological ROS  negative psych ROS   GI/Hepatic negative GI ROS, Neg liver ROS,   Endo/Other  Morbid obesity  Renal/GU negative Renal ROS  negative genitourinary   Musculoskeletal   Abdominal   Peds  Hematology negative hematology ROS (+)        Component                Value               Date/Time                 HGB                      10.9 (L)            05/08/2021 0916           HGB                      13.5                12/16/2018 1114           HCT                      32.9 (L)            05/08/2021 0916           HCT                      40.6                12/16/2018 1114           PLT                      246                 05/08/2021 0916           PLT                      313                 12/16/2018 1114        Anesthesia Other Findings  Admitted in October for brief loss of consciousness, thought likely to be vasovagal syncope- normal MRI, EEG and echo  Reproductive/Obstetrics (+) Pregnancy G3P1011 at [redacted]w[redacted]d Prior C/S x1                            Anesthesia Physical Anesthesia Plan  ASA: 3  Anesthesia Plan: Spinal   Post-op Pain Management:    Induction:   PONV Risk Score and Plan: Ondansetron and Treatment may vary due to age or medical condition  Airway Management  Planned:   Additional  Equipment:   Intra-op Plan:   Post-operative Plan:   Informed Consent: I have reviewed the patients History and Physical, chart, labs and discussed the procedure including the risks, benefits and alternatives for the proposed anesthesia with the patient or authorized representative who has indicated his/her understanding and acceptance.       Plan Discussed with: Anesthesiologist  Anesthesia Plan Comments:        Anesthesia Quick Evaluation

## 2021-05-10 LAB — CBC
HCT: 26.6 % — ABNORMAL LOW (ref 36.0–46.0)
Hemoglobin: 8.7 g/dL — ABNORMAL LOW (ref 12.0–15.0)
MCH: 26.5 pg (ref 26.0–34.0)
MCHC: 32.7 g/dL (ref 30.0–36.0)
MCV: 81.1 fL (ref 80.0–100.0)
Platelets: 218 10*3/uL (ref 150–400)
RBC: 3.28 MIL/uL — ABNORMAL LOW (ref 3.87–5.11)
RDW: 13.6 % (ref 11.5–15.5)
WBC: 10.9 10*3/uL — ABNORMAL HIGH (ref 4.0–10.5)
nRBC: 0 % (ref 0.0–0.2)

## 2021-05-10 NOTE — Lactation Note (Signed)
This note was copied from a baby's chart. Lactation Consultation Note  Patient Name: Boy Shilynn Hoch [id number] Date: 05/10/2021 Reason for consult: Follow-up assessment;Difficult latch;Mother's request;Term Age:29 hours P2, term female infant -4% weight loss. Per dad, most feedings are 30 minutes in length,. Per mom, she has  nipple soreness due to previous shallow latches on day 1 mom is using coconut oil and comfort gels but not using then together.  Per mom, her right breast is sore would like assistance with latching infant on her right breast she has mostly been feeding infant on her left breast. LC assisted mom with using the football hold position, infant latched with depth, sustaining latch and BF for 10 minutes on mom's right breast, infant was taken off breast few times, mom's nipples were rounded and not pinched. Mom latched infant on her left breast after 10 minutes using the football hold position, infant was still breastfeeding after 20 minutes when LC left the room. Mom understands that infant is currently cluster feeding and this is normal behavior. LC reviewed how to hand express using breast module and mom self expressed, colostrum is present in both breast. Mom knows to call RN/LC if she has any breastfeeding questions, concerns or need further assistance with latching infant at the breast.  Maternal Data Does the patient have breastfeeding experience prior to this delivery?: Yes How long did the patient breastfeed?: Per mom, she BF her 12 year old for 3 months.  Feeding Mother's Current Feeding Choice: Breast Milk  LATCH Score Latch: Grasps breast easily, tongue down, lips flanged, rhythmical sucking.  Audible Swallowing: Spontaneous and intermittent  Type of Nipple: Flat  Comfort (Breast/Nipple): Filling, red/small blisters or bruises, mild/mod discomfort  Hold (Positioning): Assistance needed to correctly position infant at breast and maintain latch.  LATCH  Score: 7   Lactation Tools Discussed/Used Tools: Coconut oil;Comfort gels  Interventions Interventions: Skin to skin;Assisted with latch;Breast compression;Adjust position;Support pillows;Position options;Expressed milk;Coconut oil;Comfort gels;Hand pump;Education  Discharge    Consult Status Consult Status: Follow-up Date: 05/11/21 Follow-up type: In-patient    Danelle Earthly 05/10/2021, 7:36 PM

## 2021-05-10 NOTE — Progress Notes (Deleted)
Notified Felipa Evener MD.

## 2021-05-10 NOTE — Progress Notes (Signed)
Patient had Symbicort inhaler at bedside and had taken it this am before RN had arrived at bedside. Does not take Dulera.

## 2021-05-10 NOTE — Progress Notes (Signed)
Patient is doing well.  She is tolerating PO, ambulating, voiding.  Pain is controlled.  Lochia is appropriate  Vitals:   05/09/21 1700 05/09/21 2130 05/10/21 0130 05/10/21 0530  BP: 90/65 (!) 93/52 (!) 112/58 (!) 104/51  Pulse:      Resp: 20 18 18 19   Temp: 97.7 F (36.5 C) 98.6 F (37 C) 98.1 F (36.7 C) 99.3 F (37.4 C)  TempSrc: Axillary Oral Oral Oral  SpO2: 100% 100%  100%  Weight:      Height:        NAD Abdomen:  soft, appropriate tenderness, pressure dressing in place ext:    Symmetric, 1+ edema bilaterally  Lab Results  Component Value Date   WBC 10.9 (H) 05/10/2021   HGB 8.7 (L) 05/10/2021   HCT 26.6 (L) 05/10/2021   MCV 81.1 05/10/2021   PLT 218 05/10/2021    --/--/B POS (12/26 01-27-1991)  A/P    30 y.o. 6415 POD 1 s/p RCS at term Routine post op and postpartum care.    Desires circumcision.   Discussed r/b/a of the procedure.  Reviewed that circumcision is an elective surgical procedure and not considered medically necessary.  Reviewed the risks of the procedure including the risk of infection, bleeding, damage to surrounding structures, including scrotum, shaft, urethra and head of penis, and an undesired cosmetic effect requiring additional procedures for revision.  Consent signed.

## 2021-05-11 MED ORDER — FERROUS SULFATE 325 (65 FE) MG PO TABS: 325.0000 mg | ORAL_TABLET | Freq: Two times a day (BID) | ORAL | 3 refills | Status: DC

## 2021-05-11 MED ORDER — OXYCODONE HCL 5 MG PO TABS: 5.0000 mg | ORAL_TABLET | ORAL | 0 refills | Status: DC | PRN

## 2021-05-11 NOTE — Discharge Summary (Signed)
Postpartum Discharge Summary  Date of Service updated      Patient Name: Danielle Velez DOB: April 21, 1991 MRN: [id number]  Date of admission: 05/09/2021 Delivery date:05/09/2021  Delivering provider: Carlynn Purl [facility]  Date of discharge: 05/11/2021  Admitting diagnosis: History of cesarean delivery [Z98.891] Intrauterine pregnancy: [redacted]w[redacted]d    Secondary diagnosis:  Principal Problem:   History of cesarean delivery Active Problems:   Status post repeat low transverse cesarean section  Additional problems: none    Discharge diagnosis: Term Pregnancy Delivered                                              Post partum procedures: none Augmentation: N/A Complications: None  Hospital course: Sceduled C/S   30y.o. yo [id number] 343w1d as admitted to the hospital 05/09/2021 for scheduled cesarean section with the following indication:Elective Repeat.Delivery details are as follows:  Membrane Rupture Time/Date: 8:00 AM ,05/09/2021   Delivery Method:C-Section, Low Transverse  Details of operation can be found in separate operative note.  Patient had an uncomplicated postpartum course.  She is ambulating, tolerating a regular diet, passing flatus, and urinating well. Patient is discharged home in stable condition on  05/11/21        Newborn Data: Birth date:05/09/2021  Birth time:8:01 AM  Gender:Female  Living status:Living  Apgars:9 ,9  Weight:3300 g     Magnesium Sulfate received: No BMZ received: No Rhophylac:No MMR:No T-DaP:Given prenatally Flu: No Transfusion:No  Physical exam  Vitals:   05/10/21 1516 05/10/21 2032 05/10/21 2122 05/11/21 0611  BP: (!) 99/52 108/65 (!) 114/59 120/70  Pulse: 77 89 79 91  Resp: '18 16 16 18  ' Temp: 98.5 F (36.9 C) 98.7 F (37.1 C) 98.2 F (36.8 C) 98.4 F (36.9 C)  TempSrc: Oral Oral Oral Oral  SpO2: 99% 100% 100% 100%  Weight:      Height:        Labs: Lab Results  Component Value Date   WBC 10.9 (H) 05/10/2021   HGB 8.7  (L) 05/10/2021   HCT 26.6 (L) 05/10/2021   MCV 81.1 05/10/2021   PLT 218 05/10/2021   CMP Latest Ref Rng & Units 03/23/2021  Glucose 70 - 99 mg/dL 97  BUN 6 - 20 mg/dL 8  Creatinine 0.44 - 1.00 mg/dL 0.51  Sodium 135 - 145 mmol/L 135  Potassium 3.5 - 5.1 mmol/L 3.5  Chloride 98 - 111 mmol/L 107  CO2 22 - 32 mmol/L 21(L)  Calcium 8.9 - 10.3 mg/dL 8.4(L)  Total Protein 6.5 - 8.1 g/dL 6.4(L)  Total Bilirubin 0.3 - 1.2 mg/dL 0.3  Alkaline Phos 38 - 126 U/L 97  AST 15 - 41 U/L 21  ALT 0 - 44 U/L 19   Edinburgh Score: Edinburgh Postnatal Depression Scale Screening Tool 05/10/2021  I have been able to laugh and see the funny side of things. 0  I have looked forward with enjoyment to things. 0  I have blamed myself unnecessarily when things went wrong. 1  I have been anxious or worried for no good reason. 0  I have felt scared or panicky for no good reason. 1  Things have been getting on top of me. 0  I have been so unhappy that I have had difficulty sleeping. 0  I have felt sad or miserable. 0  I have been so  unhappy that I have been crying. 0  The thought of harming myself has occurred to me. 0  Edinburgh Postnatal Depression Scale Total 2      After visit meds:  Allergies as of 05/11/2021       Reactions   Effexor [venlafaxine] Other (See Comments)   Shaky    Iodine Hives   Other Hives   DAIRY and WHEAT.  REACTION HIVES AND ITCHING.   Shellfish Allergy Itching, Swelling        Medication List     TAKE these medications    albuterol 108 (90 Base) MCG/ACT inhaler Commonly known as: VENTOLIN HFA Inhale 2 puffs every 4 hours as needed for cough, wheeze, tightness in chest, or shortness of breath.   albuterol (2.5 MG/3ML) 0.083% nebulizer solution Commonly known as: PROVENTIL Take 3 mLs (2.5 mg total) by nebulization every 6 (six) hours as needed for wheezing or shortness of breath.   budesonide-formoterol 160-4.5 MCG/ACT inhaler Commonly known as:  Symbicort INHALE 2 PUFFS TWICE A DAY WITH A SPACER TO PREVENT COUGH OR WHEEZE. RINSE MOUTH AFTER USE. What changed:  how much to take when to take this reasons to take this   cetirizine 10 MG tablet Commonly known as: ZYRTEC Take 1 tablet (10 mg total) by mouth daily. What changed:  when to take this reasons to take this   docusate sodium 100 MG capsule Commonly known as: COLACE Take 100 mg by mouth 2 (two) times daily as needed for mild constipation.   ferrous sulfate 325 (65 FE) MG tablet Take 1 tablet (325 mg total) by mouth 2 (two) times daily with a meal.   oxyCODONE 5 MG immediate release tablet Commonly known as: Oxy IR/ROXICODONE Take 1-2 tablets (5-10 mg total) by mouth every 4 (four) hours as needed for moderate pain.   simethicone 80 MG chewable tablet Commonly known as: MYLICON Chew 03-709 mg by mouth every 6 (six) hours as needed for flatulence.   valACYclovir 1000 MG tablet Commonly known as: VALTREX Take 1,000 mg by mouth daily.   Vitafol Ultra 29-0.6-0.4-200 MG Caps Take 1 capsule by mouth in the morning.               Discharge Care Instructions  (From admission, onward)           Start     Ordered   05/11/21 0000  Discharge wound care:       Comments: Sitz baths and icepacks to perineum.  If stitches, they will dissolve.   05/11/21 0719             Discharge home in stable condition Infant Feeding:  ? Infant Disposition:home with mother Discharge instruction: per After Visit Summary and Postpartum booklet. Activity: Advance as tolerated. Pelvic rest for 6 weeks.  Diet: routine diet Anticipated Birth Control: Unsure Postpartum Appointment:2 weeks Additional Postpartum F/U: Incision check 2 weeks Future Appointments:No future appointments. Follow up Visit:  [place] Follow up in 2 day(s).   Why: incison check Contact information: [place]  [address] [phone]                     05/11/2021 Daria Pastures, MD

## 2021-05-11 NOTE — Lactation Note (Signed)
This note was copied from a baby's chart. Lactation Consultation Note  Patient Name: Danielle Velez [id number] Date: 05/11/2021 Reason for consult: Follow-up assessment Age:29 hours  Mother has baby latched upon entering with lips flanged. Mother states her nipples are still sore with cracks on tips. She is using coconut oil and will alternate with comfort gels. Discussed taking a break from latching when home to allow nipples to heal and pump with DEBP. Suggest OP appt is soreness continues.   Pacifier use not recommended at this time.  Reviewed engorgement care and monitoring voids/stools.   Feeding Mother's Current Feeding Choice: Breast Milk  LATCH Score Latch: Grasps breast easily, tongue down, lips flanged, rhythmical sucking. (latched upon entering w/ lips flanged)  Audible Swallowing: A few with stimulation  Type of Nipple: Everted at rest and after stimulation  Comfort (Breast/Nipple): Engorged, cracked, bleeding, large blisters, severe discomfort  Hold (Positioning): No assistance needed to correctly position infant at breast.  LATCH Score: 7   Lactation Tools Discussed/Used  DEBP  Interventions Interventions: Breast feeding basics reviewed;Comfort gels;Coconut oil;Education  Discharge Discharge Education: Engorgement and breast care;Warning signs for feeding baby;Outpatient recommendation (if nipple cracks do not heal/improve) Pump: Personal;DEBP  Consult Status Consult Status: Follow-up Date: 05/11/21 Follow-up type: In-patient    Dahlia Byes [facility] 05/11/2021, 8:50 AM

## 2021-05-11 NOTE — Progress Notes (Signed)
°  Patient is eating, ambulating, voiding.  Pain control is good.  Vitals:   05/10/21 1516 05/10/21 2032 05/10/21 2122 05/11/21 0611  BP: (!) 99/52 108/65 (!) 114/59 120/70  Pulse: 77 89 79 91  Resp: 18 16 16 18   Temp: 98.5 F (36.9 C) 98.7 F (37.1 C) 98.2 F (36.8 C) 98.4 F (36.9 C)  TempSrc: Oral Oral Oral Oral  SpO2: 99% 100% 100% 100%  Weight:      Height:        lungs:   clear to auscultation cor:    RRR Abdomen:  soft, appropriate tenderness, incisions intact and without erythema or exudate ex:    no cords   Lab Results  Component Value Date   WBC 10.9 (H) 05/10/2021   HGB 8.7 (L) 05/10/2021   HCT 26.6 (L) 05/10/2021   MCV 81.1 05/10/2021   PLT 218 05/10/2021    --/--/B POS (12/26 0928)/RI  A/P    Post operative day 2.  Routine post op and postpartum care.  Expect d/c routine.  Oxycodone for pain control.  Moderate blood loss anemia- iron.

## 2021-05-23 ENCOUNTER — Telehealth (HOSPITAL_COMMUNITY): Payer: Self-pay | Admitting: *Deleted

## 2021-05-23 NOTE — Telephone Encounter (Signed)
Phone voicemail message left to return nurse call.  Duffy Rhody, RN 05-23-2021 at 11:19am

## 2021-05-25 ENCOUNTER — Other Ambulatory Visit: Payer: Self-pay

## 2021-05-25 ENCOUNTER — Inpatient Hospital Stay (HOSPITAL_COMMUNITY)
Admission: AD | Admit: 2021-05-25 | Discharge: 2021-05-25 | Discharge status: Against Medical Advice | Payer: Medicaid Other | Attending: Obstetrics and Gynecology | Admitting: Obstetrics and Gynecology

## 2021-05-25 NOTE — Progress Notes (Signed)
Pt called 3rd and final time into to be triaged.  Again, no answer.  No longer in lobby.

## 2021-05-25 NOTE — Progress Notes (Signed)
Pt called to be triaged, no answer.  RN will call again in 10-15 minutes.

## 2021-05-25 NOTE — Progress Notes (Signed)
Pt called 2nd time to be triaged.  No answer, will call again 10-15 minutes.

## 2021-06-21 NOTE — Progress Notes (Incomplete)
Cardiology Office Note:    Date:  06/21/2021   ID:  Danielle Velez, DOB 1991-04-18, MRN [id number]  PCP:  Anne Ng, NP   [facility] Providers Cardiologist:  None { Click to update primary MD,subspecialty MD or APP then REFRESH:1}    Referring MD: Van Clines, MD   History of Present Illness:    Danielle Velez is a 31 y.o. female here for the evaluation of syncope at the request of Dr. Karel Jarvis.  She had a scheduled cesarean section 05/09/2021 and delivered a healthy baby boy.  She saw Dr. Karel Jarvis 03/29/2021 for evaluation of recurrent syncope. 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 Then in 07/2020 she passed out in the setting of being ill and taking Theraflu. On 02/25/21 she lost consciousness after standing up in the setting of hot weather at her son's game and feeling lightheaded. After that episode she presented to the ER and evaluated by neurology, who felt that her symptoms were likely vasovagal. Her latest episode had been on 03/23/21 while cooking and feeling lightheaded. She sat down, and her lightheadedness intensified when standing up again so she passed out. After presenting to the ED, she was evaluated by neurology. EEG, MRI, and echo showed no abnormalities. At her visit with Dr. Karel Jarvis she was advised to increase hydration, perform counterpressure maneuvers, and she was referred to cardiology.  Today: Overall,  She denies any palpitations, chest pain, or shortness of breath. No lightheadedness, headaches, syncope, orthopnea, PND, lower extremity edema or exertional symptoms.  (+)  Past Medical History:  Diagnosis Date   Angio-edema    Asthma    Depression    Food allergy 03/22/2015   HSV infection    Hypotension    with syncopal episodes per pt   Moderate persistent asthma with acute exacerbation 03/09/2015   Recurrent pleurisy 07/19/2016   Recurrent upper respiratory infection  (URI)    Seizures (HCC)    as a child   Urticaria     Past Surgical History:  Procedure Laterality Date   CESAREAN SECTION     CESAREAN SECTION  09/16/2010   CESAREAN SECTION N/A 05/09/2021   Procedure: CESAREAN SECTION;  Surgeon: Edwinna Areola, DO;  Location: [facility] LD [facility];  Service: Obstetrics;  Laterality: N/A;    Current Medications: No outpatient medications have been marked as taking for the 06/22/21 encounter (Appointment) with Jake Bathe, MD.     Allergies:   Effexor [venlafaxine], Iodine, Other, and Shellfish allergy   Social History   Socioeconomic History   Marital status: Single    Spouse name: Not on file   Number of children: Not on file   Years of education: Not on file   Highest education level: Not on file  Occupational History   Not on file  Tobacco Use   Smoking status: Never   Smokeless tobacco: Never  Vaping Use   Vaping Use: Never used  Substance and Sexual Activity   Alcohol use: No    Alcohol/week: 0.0 standard drinks   Drug use: No   Sexual activity: Not Currently  Other Topics Concern   Not on file  Social History Narrative   Right handed   33 weeks preg.   Social Determinants of Health   Financial Resource Strain: Not on file  Food Insecurity: Not on file  Transportation Needs: Not on file  Physical Activity: Not on file  Stress:  Not on file  Social Connections: Not on file     Family History: The patient's family history includes Asthma in her maternal grandmother; Diabetes in her maternal grandmother; Healthy in her father; Hypertension in her maternal grandmother; Lupus in her mother. There is no history of Allergic rhinitis, Angioedema, Atopy, Eczema, Immunodeficiency, or Urticaria.  ROS:   Please see the history of present illness.     All other systems reviewed and are negative.  EKGs/Labs/Other Studies Reviewed:    The following studies were reviewed today:  Echo 02/27/2021: Sonographer Comments: [redacted] weeks  pregnant at time of study  IMPRESSIONS    1. Left ventricular ejection fraction, by estimation, is 60 to 65%. The  left ventricle has normal function. The left ventricle has no regional  wall motion abnormalities. Left ventricular diastolic parameters were  normal.   2. Right ventricular systolic function is normal. The right ventricular  size is normal. There is normal pulmonary artery systolic pressure.   3. The mitral valve is normal in structure. No evidence of mitral valve  regurgitation. No evidence of mitral stenosis.   4. The aortic valve is normal in structure. Aortic valve regurgitation is  not visualized. No aortic stenosis is present.   5. The inferior vena cava is normal in size with greater than 50%  respiratory variability, suggesting right atrial pressure of 3 mmHg.   EKG:  EKG is personally reviewed and interpreted. 06/22/2021: Sinus ***. Rate *** bpm.  Recent Labs: 02/26/2021: TSH 2.342 03/23/2021: ALT 19; BUN 8; Creatinine, Ser 0.51; Potassium 3.5; Sodium 135 05/10/2021: Hemoglobin 8.7; Platelets 218   Recent Lipid Panel No results found for: CHOL, TRIG, HDL, CHOLHDL, VLDL, LDLCALC, LDLDIRECT   Risk Assessment/Calculations:   {Does this patient have ATRIAL FIBRILLATION?:[phone]}       Physical Exam:    VS:  There were no vitals taken for this visit.    Wt Readings from Last 3 Encounters:  05/09/21 212 lb 3.2 oz (96.3 kg)  05/02/21 209 lb (94.8 kg)  04/10/21 206 lb 14.4 oz (93.8 kg)     GEN: Well nourished, well developed in no acute distress HEENT: Normal NECK: No JVD; No carotid bruits LYMPHATICS: No lymphadenopathy CARDIAC: RRR, no murmurs, rubs, gallops RESPIRATORY:  Clear to auscultation without rales, wheezing or rhonchi  ABDOMEN: Soft, non-tender, non-distended MUSCULOSKELETAL:  No edema; No deformity  SKIN: Warm and dry NEUROLOGIC:  Alert and oriented x 3 PSYCHIATRIC:  Normal affect   ASSESSMENT:    No diagnosis found. PLAN:    In  order of problems listed above:  No problem-specific Assessment & Plan notes found for this encounter.    {Are you ordering a CV Procedure (e.g. stress test, cath, DCCV, TEE, etc)?   Press F2        :[id number]}   Follow-up: *** months.  Medication Adjustments/Labs and Tests Ordered: Current medicines are reviewed at length with the patient today.  Concerns regarding medicines are outlined above.   No orders of the defined types were placed in this encounter.  No orders of the defined types were placed in this encounter.  There are no Patient Instructions on file for this visit.   I,Mathew Stumpf,acting as a Neurosurgeon for [employer], MD.,have documented all relevant documentation on the behalf of Donato Schultz, MD,as directed by  Donato Schultz, MD while in the presence of Donato Schultz, MD.  ***  Signed, Carlena Bjornstad  06/21/2021 10:25 AM    [facility]

## 2021-06-22 ENCOUNTER — Ambulatory Visit: Payer: Medicaid Other | Admitting: Cardiology

## 2021-07-24 ENCOUNTER — Other Ambulatory Visit: Payer: Self-pay

## 2021-07-24 ENCOUNTER — Ambulatory Visit (INDEPENDENT_AMBULATORY_CARE_PROVIDER_SITE_OTHER): Payer: Medicaid Other | Admitting: Nurse Practitioner

## 2021-07-24 ENCOUNTER — Encounter: Payer: Self-pay | Admitting: Nurse Practitioner

## 2021-07-24 VITALS — BP 128/70 | HR 76 | Temp 98.7°F | Ht 60.0 in | Wt 198.8 lb

## 2021-07-24 DIAGNOSIS — J0100 Acute maxillary sinusitis, unspecified: Secondary | ICD-10-CM

## 2021-07-24 MED ORDER — PREDNISONE 10 MG PO TABS: ORAL_TABLET | ORAL | 0 refills | Status: DC

## 2021-07-24 MED ORDER — AMOXICILLIN-POT CLAVULANATE 875-125 MG PO TABS: 1.0000 | ORAL_TABLET | Freq: Two times a day (BID) | ORAL | 0 refills | Status: DC

## 2021-07-24 NOTE — Progress Notes (Signed)
Acute Office Visit  Subjective:    Patient ID: Danielle Velez, female    DOB: 07/31/1990, 31 y.o.   MRN: [id number]  Chief Complaint  Patient presents with   URI    Sneezing , coughing  congestion soar throat no fever negative covid test.    HPI Patient is in today for sinus congestion, sneezing and coughing for 5 days. Covid-19 test negative.   UPPER RESPIRATORY TRACT INFECTION  Fever: no Cough: yes Shortness of breath: yes Wheezing: yes Chest pain: no Chest tightness: yes Chest congestion: yes Nasal congestion: yes Runny nose: yes Post nasal drip: no Sneezing: yes Sore throat: yes Swollen glands: no Sinus pressure: yes Headache: yes Face pain: no Toothache: no Ear pain: no bilateral Ear pressure: yes bilateral Eyes red/itching:no Eye drainage/crusting: no  Vomiting: no Rash: no Fatigue: yes Sick contacts: no Strep contacts: no  Context: worse Recurrent sinusitis: no Relief with OTC cold/cough medications: no  Treatments attempted: flonase, mucinex    Past Medical History:  Diagnosis Date   Angio-edema    Asthma    Depression    Food allergy 03/22/2015   HSV infection    Hypotension    with syncopal episodes per pt   Moderate persistent asthma with acute exacerbation 03/09/2015   Recurrent pleurisy 07/19/2016   Recurrent upper respiratory infection (URI)    Seizures (HCC)    as a child   Urticaria     Past Surgical History:  Procedure Laterality Date   CESAREAN SECTION     CESAREAN SECTION  09/16/2010   CESAREAN SECTION N/A 05/09/2021   Procedure: CESAREAN SECTION;  Surgeon: Edwinna Areola, DO;  Location: [facility];  Service: Obstetrics;  Laterality: N/A;    Family History  Problem Relation Age of Onset   Lupus Mother    Healthy Father    Hypertension Maternal Grandmother    Diabetes Maternal Grandmother    Asthma Maternal Grandmother    Allergic rhinitis Neg Hx    Angioedema Neg Hx    Atopy Neg Hx    Eczema Neg Hx     Immunodeficiency Neg Hx    Urticaria Neg Hx     Social History   Socioeconomic History   Marital status: Single    Spouse name: Not on file   Number of children: Not on file   Years of education: Not on file   Highest education level: Not on file  Occupational History   Not on file  Tobacco Use   Smoking status: Never   Smokeless tobacco: Never  Vaping Use   Vaping Use: Never used  Substance and Sexual Activity   Alcohol use: No    Alcohol/week: 0.0 standard drinks   Drug use: No   Sexual activity: Not Currently  Other Topics Concern   Not on file  Social History Narrative   Right handed   33 weeks preg.   Social Determinants of Health   Financial Resource Strain: Not on file  Food Insecurity: Not on file  Transportation Needs: Not on file  Physical Activity: Not on file  Stress: Not on file  Social Connections: Not on file  Intimate Partner Violence: Not on file    Outpatient Medications Prior to Visit  Medication Sig Dispense Refill   albuterol (PROVENTIL) (2.5 MG/3ML) 0.083% nebulizer solution Take 3 mLs (2.5 mg total) by nebulization every 6 (six) hours as needed for wheezing or shortness of breath. 75 mL 0   albuterol (VENTOLIN HFA) 108 (90  Base) MCG/ACT inhaler Inhale 2 puffs every 4 hours as needed for cough, wheeze, tightness in chest, or shortness of breath. 18 g 1   budesonide-formoterol (SYMBICORT) 160-4.5 MCG/ACT inhaler INHALE 2 PUFFS TWICE A DAY WITH A SPACER TO PREVENT COUGH OR WHEEZE. RINSE MOUTH AFTER USE. (Patient taking differently: 2 puffs 2 (two) times daily as needed (cough/wheezing.). INHALE 2 PUFFS TWICE A DAY WITH A SPACER TO PREVENT COUGH OR WHEEZE. RINSE MOUTH AFTER USE.) 30.6 each 0   cetirizine (ZYRTEC) 10 MG tablet Take 1 tablet (10 mg total) by mouth daily. (Patient taking differently: Take 10 mg by mouth daily as needed for allergies.) 30 tablet 5   docusate sodium (COLACE) 100 MG capsule Take 100 mg by mouth 2 (two) times daily as needed  for mild constipation.     ferrous sulfate 325 (65 FE) MG tablet Take 1 tablet (325 mg total) by mouth 2 (two) times daily with a meal. 95 tablet 3   oxyCODONE (OXY IR/ROXICODONE) 5 MG immediate release tablet Take 1-2 tablets (5-10 mg total) by mouth every 4 (four) hours as needed for moderate pain. 30 tablet 0   Prenat-Fe Poly-Methfol-FA-DHA (VITAFOL ULTRA) 29-0.6-0.4-200 MG CAPS Take 1 capsule by mouth in the morning.     simethicone (MYLICON) 80 MG chewable tablet Chew 80-160 mg by mouth every 6 (six) hours as needed for flatulence.     valACYclovir (VALTREX) 1000 MG tablet Take 1,000 mg by mouth daily.     No facility-administered medications prior to visit.    Allergies  Allergen Reactions   Effexor [Venlafaxine] Other (See Comments)    Shaky    Iodine Hives   Other Hives    DAIRY and WHEAT.  REACTION HIVES AND ITCHING.   Shellfish Allergy Itching and Swelling    Review of Systems See pertinent positives and negatives per HPI.    Objective:    Physical Exam Vitals and nursing note reviewed.  Constitutional:      General: She is not in acute distress.    Appearance: Normal appearance.  HENT:     Head: Normocephalic.     Right Ear: Tympanic membrane, ear canal and external ear normal.     Left Ear: Tympanic membrane, ear canal and external ear normal.     Mouth/Throat:     Pharynx: Posterior oropharyngeal erythema present. No oropharyngeal exudate.  Eyes:     Conjunctiva/sclera: Conjunctivae normal.  Cardiovascular:     Rate and Rhythm: Normal rate and regular rhythm.     Pulses: Normal pulses.     Heart sounds: Normal heart sounds.  Pulmonary:     Effort: Pulmonary effort is normal.     Breath sounds: Normal breath sounds.  Musculoskeletal:     Cervical back: Normal range of motion and neck supple. No tenderness.  Lymphadenopathy:     Cervical: No cervical adenopathy.  Skin:    General: Skin is warm.  Neurological:     General: No focal deficit present.      Mental Status: She is alert and oriented to person, place, and time.  Psychiatric:        Mood and Affect: Mood normal.        Behavior: Behavior normal.        Thought Content: Thought content normal.        Judgment: Judgment normal.    BP 128/70 (BP Location: Right Arm, Patient Position: Sitting, Cuff Size: Normal)    Pulse 76    Temp 98.7  F (37.1 C) (Temporal)    Ht 5' (1.524 m)    Wt 198 lb 12.8 oz (90.2 kg)    SpO2 99%    BMI 38.83 kg/m  Wt Readings from Last 3 Encounters:  07/24/21 198 lb 12.8 oz (90.2 kg)  05/09/21 212 lb 3.2 oz (96.3 kg)  05/02/21 209 lb (94.8 kg)    Health Maintenance Due  Topic Date Due   COVID-19 Vaccine (1) Never done   PAP SMEAR-Modifier  06/07/2020   INFLUENZA VACCINE  Never done    There are no preventive care reminders to display for this patient.   Lab Results  Component Value Date   TSH 2.342 02/26/2021   Lab Results  Component Value Date   WBC 10.9 (H) 05/10/2021   HGB 8.7 (L) 05/10/2021   HCT 26.6 (L) 05/10/2021   MCV 81.1 05/10/2021   PLT 218 05/10/2021   Lab Results  Component Value Date   NA 135 03/23/2021   K 3.5 03/23/2021   CO2 21 (L) 03/23/2021   GLUCOSE 97 03/23/2021   BUN 8 03/23/2021   CREATININE 0.51 03/23/2021   BILITOT 0.3 03/23/2021   ALKPHOS 97 03/23/2021   AST 21 03/23/2021   ALT 19 03/23/2021   PROT 6.4 (L) 03/23/2021   ALBUMIN 2.8 (L) 03/23/2021   CALCIUM 8.4 (L) 03/23/2021   ANIONGAP 7 03/23/2021   No results found for: CHOL No results found for: HDL No results found for: LDLCALC No results found for: TRIG No results found for: CHOLHDL No results found for: BSJG2E     Assessment & Plan:   Problem List Items Addressed This Visit   None Visit Diagnoses     Acute non-recurrent maxillary sinusitis    -  Primary   Treat with augmentin BID x10 days and prednsione taper. Continue flonase and mucinex. Will check flu, covid-19, RSV per pt. request with newborn at home.    Relevant Medications    amoxicillin-clavulanate (AUGMENTIN) 875-125 MG tablet   predniSONE (DELTASONE) 10 MG tablet   Other Relevant Orders   COVID-19, Flu A+B and RSV        Meds ordered this encounter  Medications   amoxicillin-clavulanate (AUGMENTIN) 875-125 MG tablet    Sig: Take 1 tablet by mouth 2 (two) times daily.    Dispense:  20 tablet    Refill:  0   predniSONE (DELTASONE) 10 MG tablet    Sig: Take 6 tablets today, then 5 tablets tomorrow, then decrease by 1 tablet every day until gone    Dispense:  21 tablet    Refill:  0     Gerre Scull, NP

## 2021-07-24 NOTE — Patient Instructions (Signed)
It was great to see you! ? ?Start augmentin 1 tablet twice a day with food. Start prednisone taper 6 tablets today, 5 tablets tomorrow, then decrease by 1 daily until gone. ? ?We are sending the test for covid-19 and flu and the results should come to your mychart tomorrow.  ? ?Let's follow-up if your symptoms don't improve or worsen.  ? ?Take care, ? ?Rodman Pickle, NP ? ?

## 2021-07-25 ENCOUNTER — Ambulatory Visit: Payer: Medicaid Other | Admitting: Nurse Practitioner

## 2021-07-26 LAB — SPECIMEN STATUS REPORT

## 2021-07-26 LAB — COVID-19, FLU A+B AND RSV
Influenza A, NAA: NOT DETECTED
Influenza B, NAA: NOT DETECTED
RSV, NAA: NOT DETECTED
SARS-CoV-2, NAA: NOT DETECTED

## 2021-07-27 ENCOUNTER — Ambulatory Visit: Payer: Medicaid Other | Admitting: Nurse Practitioner

## 2022-01-23 ENCOUNTER — Encounter: Payer: Self-pay | Admitting: Allergy & Immunology

## 2022-01-23 ENCOUNTER — Ambulatory Visit (INDEPENDENT_AMBULATORY_CARE_PROVIDER_SITE_OTHER): Payer: Medicaid Other | Admitting: Allergy & Immunology

## 2022-01-23 ENCOUNTER — Other Ambulatory Visit: Payer: Self-pay

## 2022-01-23 VITALS — BP 116/76 | HR 65 | Temp 98.2°F | Resp 18 | Ht 61.0 in | Wt 210.6 lb

## 2022-01-23 DIAGNOSIS — J302 Other seasonal allergic rhinitis: Secondary | ICD-10-CM

## 2022-01-23 DIAGNOSIS — T7800XD Anaphylactic reaction due to unspecified food, subsequent encounter: Secondary | ICD-10-CM | POA: Diagnosis not present

## 2022-01-23 DIAGNOSIS — L501 Idiopathic urticaria: Secondary | ICD-10-CM | POA: Diagnosis not present

## 2022-01-23 DIAGNOSIS — T7800XA Anaphylactic reaction due to unspecified food, initial encounter: Secondary | ICD-10-CM

## 2022-01-23 DIAGNOSIS — J4540 Moderate persistent asthma, uncomplicated: Secondary | ICD-10-CM | POA: Diagnosis not present

## 2022-01-23 DIAGNOSIS — J3089 Other allergic rhinitis: Secondary | ICD-10-CM

## 2022-01-23 MED ORDER — BUDESONIDE-FORMOTEROL FUMARATE 160-4.5 MCG/ACT IN AERO: 2.0000 | INHALATION_SPRAY | Freq: Two times a day (BID) | RESPIRATORY_TRACT | 5 refills | Status: DC

## 2022-01-23 MED ORDER — ALBUTEROL SULFATE HFA 108 (90 BASE) MCG/ACT IN AERS: INHALATION_SPRAY | RESPIRATORY_TRACT | 1 refills | Status: DC

## 2022-01-23 MED ORDER — CETIRIZINE HCL 10 MG PO TABS: 10.0000 mg | ORAL_TABLET | Freq: Every day | ORAL | 5 refills | Status: DC

## 2022-01-23 MED ORDER — FLUTICASONE PROPIONATE 50 MCG/ACT NA SUSP: 2.0000 | Freq: Every day | NASAL | 5 refills | Status: DC
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
## 2022-01-23 NOTE — Patient Instructions (Addendum)
1. Moderate persistent asthma with recurrent episodes of pleurisy - Spirometry looked stable. - We are going to get some labs to rule out difficult to control causes of asthma. - We may need to start an injectable medication, but this will depend on the labs.  - Daily controller medication(s): Symbicort 160/4.5 two puffs twice daily with spacer - Rescue medications: ProAir 4 puffs every 4-6 hours as needed or albuterol nebulizer one vial puffs every 4-6 hours as needed - Asthma control goals:  * Full participation in all desired activities (may need albuterol before activity) * Albuterol use two time or less a week on average (not counting use with activity) * Cough interfering with sleep two time or less a month * Oral steroids no more than once a year * No hospitalizations  3. Allergic rhinitis - Continue with Zyrtec (cetirizine) 10mg  daily. - Continue with Flonase one spray per nostril daily.    4. Food allergies (shellfish, wheat, and cow's milk) - EpiPen is up to date.  - Continue to avoid your triggering foods.   5. Return in about 3 months (around 04/24/2022).    Please inform 14/04/2022 of any Emergency Department visits, hospitalizations, or changes in symptoms. Call us before going to the ED for breathing or allergy symptoms since we might be able to fit you in for a sick visit. Feel free to contact us anytime with any questions, problems, or concerns.  It was a pleasure to see you again today!  Websites that have reliable patient information: 1. American Academy of Asthma, Allergy, and Immunology: www.aaaai.org 2. Food Allergy Research and Education (FARE): foodallergy.org 3. Mothers of Asthmatics: http://www.asthmacommunitynetwork.org 4. American College of Allergy, Asthma, and Immunology: www.acaai.org   COVID-19 Vaccine Information can be found at: [place] For questions related to vaccine distribution or  appointments, please email [email or web address] or call [phone].     "Like" [phone] on Facebook and Instagram for our latest updates!        Make sure you are registered to vote! If you have moved or changed any of your contact information, you will need to get this updated before voting!  In some cases, you MAY be able to register to vote online: [place]

## 2022-01-24 ENCOUNTER — Telehealth: Payer: Self-pay | Admitting: *Deleted

## 2022-01-24 NOTE — Telephone Encounter (Signed)
Spoke to patient and advised approval and submit for Harrington Challenger to [facility] and will reach out once delivery set to make appt to start therapy

## 2022-01-24 NOTE — Telephone Encounter (Signed)
-----   Message from Alfonse Spruce, MD sent at 01/23/2022  1:57 PM EDT ----- Harrington Challenger? She had an AEC 200 in November 2022.

## 2022-01-24 NOTE — Telephone Encounter (Signed)
Miracle worker!   Danielle Bonds, MD [facility]

## 2022-01-24 NOTE — Telephone Encounter (Signed)
L/m for patient to advise approval and submit for Fasenra to [facility]

## 2022-02-05 ENCOUNTER — Other Ambulatory Visit: Payer: Self-pay | Admitting: *Deleted

## 2022-02-05 MED ORDER — FASENRA 30 MG/ML ~~LOC~~ SOSY
30.0000 mg | PREFILLED_SYRINGE | SUBCUTANEOUS | 9 refills | Status: DC
Start: 2022-02-05 — End: 2022-05-25

## 2022-04-01 ENCOUNTER — Telehealth: Payer: Medicaid Other | Admitting: Family

## 2022-04-01 DIAGNOSIS — R399 Unspecified symptoms and signs involving the genitourinary system: Secondary | ICD-10-CM

## 2022-04-01 MED ORDER — CEPHALEXIN 500 MG PO CAPS: 500.0000 mg | ORAL_CAPSULE | Freq: Two times a day (BID) | ORAL | 0 refills | Status: DC
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
## 2022-05-02 ENCOUNTER — Telehealth: Payer: Self-pay | Admitting: Nurse Practitioner

## 2022-05-02 ENCOUNTER — Ambulatory Visit: Payer: Medicaid Other | Admitting: Family Medicine

## 2022-05-02 NOTE — Telephone Encounter (Signed)
Pt called in to cancel 12/20 OV with [facility] same day. This is her first, letter sent

## 2022-05-16 NOTE — Telephone Encounter (Signed)
1st no show, fee waived, letter sent 

## 2022-05-25 ENCOUNTER — Encounter: Payer: Self-pay | Admitting: Nurse Practitioner

## 2022-05-25 ENCOUNTER — Ambulatory Visit: Payer: Medicaid Other | Admitting: Nurse Practitioner

## 2022-05-25 ENCOUNTER — Other Ambulatory Visit (HOSPITAL_COMMUNITY)
Admission: RE | Admit: 2022-05-25 | Discharge: 2022-05-25 | Disposition: A | Discharge status: Home / Self Care | Payer: Medicaid Other | Source: Ambulatory Visit | Attending: Nurse Practitioner | Admitting: Nurse Practitioner

## 2022-05-25 VITALS — BP 102/60 | HR 65 | Temp 97.3°F | Ht 61.0 in | Wt 218.8 lb

## 2022-05-25 DIAGNOSIS — N898 Other specified noninflammatory disorders of vagina: Secondary | ICD-10-CM | POA: Insufficient documentation

## 2022-05-25 DIAGNOSIS — Z8669 Personal history of other diseases of the nervous system and sense organs: Secondary | ICD-10-CM | POA: Insufficient documentation

## 2022-05-25 DIAGNOSIS — J069 Acute upper respiratory infection, unspecified: Secondary | ICD-10-CM

## 2022-05-25 DIAGNOSIS — Z113 Encounter for screening for infections with a predominantly sexual mode of transmission: Secondary | ICD-10-CM | POA: Insufficient documentation

## 2022-05-25 DIAGNOSIS — G40909 Epilepsy, unspecified, not intractable, without status epilepticus: Secondary | ICD-10-CM | POA: Insufficient documentation

## 2022-05-25 LAB — POCT INFLUENZA A/B
Influenza A, POC: NEGATIVE
Influenza B, POC: NEGATIVE

## 2022-05-25 LAB — POC COVID19 BINAXNOW: SARS Coronavirus 2 Ag: NEGATIVE

## 2022-05-25 NOTE — Patient Instructions (Addendum)
Go to lab URI Instructions: Negative COVID and flu test.  Encourage adequate oral hydration.  Use over-the-counter  "Tylenol cold"or "Advil cold" for congestion.  Use" Delsym" or" Robitussin" cough syrup for cough.  You can use plain "Tylenol" or "Advi"l for fever, chills and achyness.   "Common cold" symptoms are usually triggered by a virus.  The antibiotics are usually not necessary. On average, a" viral cold" illness may take 7-10 days to resolve. Please, make an appointment if you are not better or if you're worse.

## 2022-05-25 NOTE — Progress Notes (Signed)
Established Patient Visit  Patient: Danielle Velez   DOB: 01-20-1991   32 y.o. Female  MRN: [id number] Visit Date: 05/25/2022  Subjective:    Chief Complaint  Patient presents with   Acute Visit    C/o headache, chest pains, nasal congestion, mild cough. Denies chills or body aches Abd pain, Vaginal discharge with no smell   URI  This is a new problem. The current episode started in the past 7 days. The problem has been unchanged. There has been no fever. Associated symptoms include congestion, coughing, headaches, rhinorrhea and sinus pain. Pertinent negatives include no abdominal pain, chest pain, diarrhea, dysuria, ear pain, joint pain, joint swelling, nausea, neck pain, plugged ear sensation, rash, sneezing, sore throat, swollen glands, vomiting or wheezing. She has tried nothing for the symptoms.  Vaginal Discharge The patient's primary symptoms include pelvic pain and vaginal discharge. The patient's pertinent negatives include no genital itching, genital lesions, genital odor, genital rash, missed menses or vaginal bleeding. This is a new problem. The current episode started more than 1 month ago. The problem occurs intermittently. The problem has been unchanged. The problem affects the left side. She is not pregnant. Associated symptoms include headaches and painful intercourse. Pertinent negatives include no abdominal pain, anorexia, back pain, chills, constipation, diarrhea, discolored urine, dysuria, fever, flank pain, frequency, hematuria, joint pain, joint swelling, nausea, rash, sore throat, urgency or vomiting. The vaginal discharge was thin and white. There has been no bleeding. She has not been passing clots. She has not been passing tissue. The symptoms are aggravated by intercourse. She has tried nothing for the symptoms. She is sexually active. It is unknown whether or not her partner has an STD. Contraceptive use: nexplanon. Menstrual history: amenorrhea with  nexplanon. There is no history of endometriosis, a gynecological surgery, herpes simplex, menorrhagia, metrorrhagia, ovarian cysts, PID, an STD or vaginosis.  34yr old child with URI symptoms, negative for RSV, COVID and FLU Treat for UTI 74months ago with keflex 500mg  BID x 7days She requested for STD screen.  Reviewed medical, surgical, and social history today  Medications: Outpatient Medications Prior to Visit  Medication Sig   albuterol (PROVENTIL) (2.5 MG/3ML) 0.083% nebulizer solution Take 3 mLs (2.5 mg total) by nebulization every 6 (six) hours as needed for wheezing or shortness of breath.   albuterol (VENTOLIN HFA) 108 (90 Base) MCG/ACT inhaler Inhale 2 puffs every 4 hours as needed for cough, wheeze, tightness in chest, or shortness of breath.   cetirizine (ZYRTEC) 10 MG tablet Take 1 tablet (10 mg total) by mouth daily.   valACYclovir (VALTREX) 1000 MG tablet Take 1,000 mg by mouth daily.   budesonide-formoterol (SYMBICORT) 160-4.5 MCG/ACT inhaler Inhale 2 puffs into the lungs in the morning and at bedtime.   [DISCONTINUED] Benralizumab (FASENRA) 30 MG/ML SOSY Inject 1 mL (30 mg total) into the skin every 28 (twenty-eight) days. For three doses then every 8 weeks (Patient not taking: Reported on 05/25/2022)   [DISCONTINUED] cephALEXin (KEFLEX) 500 MG capsule Take 1 capsule (500 mg total) by mouth 2 (two) times daily. (Patient not taking: Reported on 05/25/2022)   [DISCONTINUED] fluticasone (FLONASE) 50 MCG/ACT nasal spray Place 2 sprays into both nostrils daily. (Patient not taking: Reported on 05/25/2022)   No facility-administered medications prior to visit.   Reviewed past medical and social history.   ROS per HPI above      Objective:  BP 102/60 (BP  Location: Right Arm, Patient Position: Sitting, Cuff Size: Normal)   Pulse 65   Temp (!) 97.3 F (36.3 C) (Temporal)   Ht 5\' 1"  (1.549 m)   Wt 218 lb 12.8 oz (99.2 kg)   SpO2 98%   BMI 41.34 kg/m      Physical  Exam Vitals reviewed.  Constitutional:      General: She is not in acute distress. Cardiovascular:     Rate and Rhythm: Normal rate.     Pulses: Normal pulses.  Pulmonary:     Effort: Pulmonary effort is normal.  Abdominal:     General: There is no distension.     Palpations: Abdomen is soft. There is no mass.     Tenderness: There is abdominal tenderness in the left upper quadrant and left lower quadrant. There is no right CVA tenderness, left CVA tenderness, guarding or rebound.     Hernia: No hernia is present.  Lymphadenopathy:     Lower Body: No right inguinal adenopathy. No left inguinal adenopathy.  Neurological:     Mental Status: She is alert and oriented to person, place, and time.     Results for orders placed or performed in visit on 05/25/22  POC COVID-19  Result Value Ref Range   SARS Coronavirus 2 Ag Negative Negative  POCT Influenza A/B  Result Value Ref Range   Influenza A, POC Negative Negative   Influenza B, POC Negative Negative      Assessment & Plan:    Problem List Items Addressed This Visit   None Visit Diagnoses     Viral upper respiratory tract infection    -  Primary   Relevant Orders   POC COVID-19 (Completed)   POCT Influenza A/B (Completed)   Screen for STD (sexually transmitted disease)       Relevant Orders   HIV Antibody (routine testing w rflx)   RPR   Cervicovaginal ancillary only( [place])   Hepatitis C antibody   Acute Hep Panel & Hep B Surface Ab   Vaginal discharge       Relevant Orders   Cervicovaginal ancillary only( [facility])   Urinalysis w microscopic + reflex cultur      No follow-ups on file.     Wilfred Lacy, NP

## 2022-05-27 LAB — CERVICOVAGINAL ANCILLARY ONLY
Bacterial Vaginitis (gardnerella): NEGATIVE
Candida Glabrata: NEGATIVE
Candida Vaginitis: NEGATIVE
Chlamydia: NEGATIVE
Comment: NEGATIVE
Comment: NEGATIVE
Comment: NEGATIVE
Comment: NEGATIVE
Comment: NEGATIVE
Comment: NORMAL
Neisseria Gonorrhea: NEGATIVE
Trichomonas: NEGATIVE

## 2022-05-28 ENCOUNTER — Other Ambulatory Visit (INDEPENDENT_AMBULATORY_CARE_PROVIDER_SITE_OTHER): Payer: Medicaid Other

## 2022-05-28 DIAGNOSIS — N898 Other specified noninflammatory disorders of vagina: Secondary | ICD-10-CM

## 2022-05-28 DIAGNOSIS — Z113 Encounter for screening for infections with a predominantly sexual mode of transmission: Secondary | ICD-10-CM

## 2022-05-28 NOTE — Addendum Note (Signed)
Addended by: Beryle Lathe S on: 05/28/2022 02:02 PM   Modules accepted: Orders

## 2022-05-30 LAB — URINALYSIS W MICROSCOPIC + REFLEX CULTURE
Bacteria, UA: NONE SEEN /HPF
Bilirubin Urine: NEGATIVE
Glucose, UA: NEGATIVE
Hgb urine dipstick: NEGATIVE
Hyaline Cast: NONE SEEN /LPF
Ketones, ur: NEGATIVE
Leukocyte Esterase: NEGATIVE
Nitrites, Initial: NEGATIVE
Protein, ur: NEGATIVE
RBC / HPF: NONE SEEN /HPF (ref 0–2)
Specific Gravity, Urine: 1.016 (ref 1.001–1.035)
WBC, UA: NONE SEEN /HPF (ref 0–5)
pH: 5.5 (ref 5.0–8.0)

## 2022-05-30 LAB — ACUTE HEP PANEL AND HEP B SURFACE AB
HEPATITIS C ANTIBODY REFILL$(REFL): NONREACTIVE
Hep A IgM: NONREACTIVE
Hep B C IgM: NONREACTIVE
Hepatitis B Surface Ag: NONREACTIVE

## 2022-05-30 LAB — HEPATITIS C ANTIBODY: Hepatitis C Ab: NONREACTIVE

## 2022-05-30 LAB — HIV ANTIBODY (ROUTINE TESTING W REFLEX): HIV 1&2 Ab, 4th Generation: NONREACTIVE

## 2022-05-30 LAB — REFLEX TIQ

## 2022-05-30 LAB — RPR: RPR Ser Ql: NONREACTIVE

## 2022-05-30 LAB — NO CULTURE INDICATED

## 2022-07-04 ENCOUNTER — Telehealth: Payer: Self-pay | Admitting: Nurse Practitioner

## 2022-07-04 ENCOUNTER — Ambulatory Visit: Payer: Medicaid Other | Admitting: Nurse Practitioner

## 2022-07-04 ENCOUNTER — Ambulatory Visit: Payer: Medicaid Other | Admitting: Family Medicine

## 2022-07-04 NOTE — Telephone Encounter (Signed)
Rescheduled from 11:20 to 4:20 and now cancelled at 3:45. Her reason I just can't make it.

## 2022-07-05 NOTE — Telephone Encounter (Signed)
No show 05/02/2022  2 same day cancellations both on 07/04/2022 No reason given. Pt called 30 minutes prior to 11:20 appt and 30 min prior to 4:20 appt stating she wouldn't be here  final warning letter sent to reschedule/notify of policy via mychart

## 2022-07-12 ENCOUNTER — Other Ambulatory Visit: Payer: Self-pay

## 2022-07-12 ENCOUNTER — Ambulatory Visit (INDEPENDENT_AMBULATORY_CARE_PROVIDER_SITE_OTHER): Payer: 59 | Admitting: Allergy & Immunology

## 2022-07-12 ENCOUNTER — Encounter: Payer: Self-pay | Admitting: Allergy & Immunology

## 2022-07-12 VITALS — BP 108/62 | HR 58 | Temp 98.0°F | Resp 18 | Ht 60.0 in | Wt 218.4 lb

## 2022-07-12 DIAGNOSIS — J302 Other seasonal allergic rhinitis: Secondary | ICD-10-CM

## 2022-07-12 DIAGNOSIS — J3089 Other allergic rhinitis: Secondary | ICD-10-CM

## 2022-07-12 DIAGNOSIS — L501 Idiopathic urticaria: Secondary | ICD-10-CM | POA: Diagnosis not present

## 2022-07-12 DIAGNOSIS — J4540 Moderate persistent asthma, uncomplicated: Secondary | ICD-10-CM

## 2022-07-12 MED ORDER — CETIRIZINE HCL 10 MG PO TABS: 10.0000 mg | ORAL_TABLET | Freq: Every day | ORAL | 5 refills | Status: DC

## 2022-07-12 MED ORDER — ALBUTEROL SULFATE (2.5 MG/3ML) 0.083% IN NEBU: 2.5000 mg | INHALATION_SOLUTION | Freq: Four times a day (QID) | RESPIRATORY_TRACT | 1 refills | Status: DC | PRN

## 2022-07-12 MED ORDER — ALBUTEROL SULFATE HFA 108 (90 BASE) MCG/ACT IN AERS: INHALATION_SPRAY | RESPIRATORY_TRACT | 1 refills | Status: DC

## 2022-07-12 MED ORDER — BUDESONIDE-FORMOTEROL FUMARATE 160-4.5 MCG/ACT IN AERO: 2.0000 | INHALATION_SPRAY | Freq: Two times a day (BID) | RESPIRATORY_TRACT | 5 refills | Status: DC

## 2022-07-12 NOTE — Patient Instructions (Addendum)
1. 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  - I do not think that you need prednisone, but call us if you are still feeling bad and we can send some in.  - We will get your paperwork submitted again for Fasenra.  - Tammy will reach out to discuss with you again.  - Daily controller medication(s): Symbicort 160/4.5 two puffs twice daily with spacer - Rescue medications: albuterol 4 puffs every 4-6 hours as needed or albuterol nebulizer one vial puffs every 4-6 hours as needed - Asthma control goals:  * Full participation in all desired activities (may need albuterol before activity) * Albuterol use two time or less a week on average (not counting use with activity) * Cough interfering with sleep two time or less a month * Oral steroids no more than once a year * No hospitalizations  3. Allergic rhinitis - Continue with Zyrtec (cetirizine) '10mg'$  daily. - Continue with Flonase one spray per nostril daily.    4. Food allergies (shellfish, wheat, and cow's milk) - EpiPen is up to date.  - Continue to avoid your triggering foods.   5. Return in about 3 months (around 10/10/2022).    Please inform us of any Emergency Department visits, hospitalizations, or changes in symptoms. Call us before going to the ED for breathing or allergy symptoms since we might be able to fit you in for a sick visit. Feel free to contact us anytime with any questions, problems, or concerns.  It was a pleasure to see you again today!  Websites that have reliable patient information: 1. American Academy of Asthma, Allergy, and Immunology: www.aaaai.org 2. Food Allergy Research and Education (FARE): foodallergy.org 3. Mothers of Asthmatics: http://www.asthmacommunitynetwork.org 4. American College of Allergy, Asthma, and Immunology: www.acaai.org   COVID-19 Vaccine Information can be found at:  [email or web address].[email or web address].uk For questions related to vaccine distribution or appointments, please email [email or web address] or call [phone].     "Like" [place] on Facebook and Instagram for our latest updates!        Make sure you are registered to vote! If you have moved or changed any of your contact information, you will need to get this updated before voting!  In some cases, you MAY be able to register to vote online: [email or web address]

## 2022-07-12 NOTE — Progress Notes (Signed)
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  - I do not think that you need prednisone, but call us if you are still feeling bad and we can send some in.  - We will get your paperwork submitted again for [place].  - Tammy will reach out to discuss with you again.  - Daily controller medication(s): Symbicort 160/4.5 two puffs twice daily with spacer - Rescue medications: albuterol 4 puffs every 4-6 hours as needed or albuterol nebulizer one vial puffs every 4-6 hours as needed - Asthma control goals:  * Full participation in all desired activities (may need albuterol before activity) * Albuterol use two time or less a week on average (not counting use with activity) * Cough interfering with sleep two time or less a month * Oral steroids no more than once a year * No hospitalizations  3. Allergic rhinitis - Continue with Zyrtec (cetirizine) '10mg'$  daily. - Continue with Flonase one spray per nostril daily.    4. Food allergies (shellfish, wheat, and cow's milk) - EpiPen is up to date.  - Continue to avoid your triggering foods.   5. Return in about 3 months (around 10/10/2022).   Subjective:   Danielle Velez is a 32 y.o. 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  Danielle Velez is a 32 y.o. female presenting for a follow up visit. She was last seen in September 2023. At that time, her spirometry looked stable. We obtained some labs to look for difficult to control causes of asthma.  We did get Berna Bue approved, but she never started it.  For her rhinitis, we continue with Zyrtec as well as Flonase.  Her EpiPen was up-to-date.  Since the last visit, she has not done great.    She has really worsened since she got COVID in December 2023. Her son was recently ill with a cold and this spread to her. Danielle Velez reports that she was stable before December. She was not needing the inhaler as much. She moved in with her mom and her sister and her sister's children.   Asthma/Respiratory Symptom History: She remains on the Symbicort two puffs twice daily with a spacer. She was without it for a while. It was previously  expensive on the [employer]; now she is on [place] and [place].  We had discussed doing Berna Bue, but she changed her phone number and never got this started.  It is unclear how long she was actually out of her Symbicort, but she does have a back on board.  Allergic Rhinitis Symptom History: For her environmental allergies, we continue with Zyrtec as well as Flonase.  She has not needed antibiotics at all since last time we saw her.    Food Allergy Symptom History: She  continues to avoid wheat, shellfish, and cows milk.  She has not had any accidental exposures.  Her EpiPen is up-to-date.  Skin Symptom History: Her hives have not been much of an issue.  She was on Xolair for period of time, but has not been in quite some time.  Otherwise, there have been no changes to her past medical history, surgical history, family history, or social history.    Review of Systems  Constitutional: Negative.  Negative for chills, fever, malaise/fatigue and weight loss.  HENT:  Negative for congestion, ear discharge, ear pain and sinus pain.   Eyes:  Negative for pain, discharge and redness.  Respiratory:  Positive for cough and shortness of breath. Negative for sputum production and wheezing.   Cardiovascular: Negative.  Negative for chest pain and palpitations.  Gastrointestinal:  Negative for abdominal pain, constipation, diarrhea, heartburn, nausea and vomiting.  Skin: Negative.  Negative for itching and rash.  Neurological:  Negative for dizziness and headaches.  Endo/Heme/Allergies:  Negative for environmental allergies. Does not bruise/bleed easily.       Objective:   Blood pressure 108/62, pulse (!) 58, temperature 98 F (36.7 C), temperature source Temporal, resp. rate 18, height 5' (1.524 m), weight 218 lb 6.4 oz (99.1 kg), SpO2 98 %, not currently breastfeeding. Body mass index is 42.65 kg/m.    Physical Exam Vitals reviewed.  Constitutional:      Appearance: She is well-developed.     Comments: Pleasant female. Cooperative with the exam.   HENT:     Head: Normocephalic and atraumatic.     Right Ear: Tympanic membrane, ear canal and external ear normal.     Left Ear: Tympanic membrane, ear canal and external ear normal.     Nose: Rhinorrhea present. No nasal deformity, septal deviation or mucosal edema.     Right Turbinates: Enlarged, swollen and pale.     Left Turbinates: Enlarged, swollen and pale.     Right Sinus: No maxillary sinus  tenderness or frontal sinus tenderness.     Left Sinus: No maxillary sinus tenderness or frontal sinus tenderness.     Mouth/Throat:     Mouth: Mucous membranes are not pale and not dry.     Pharynx: Uvula midline.  Eyes:     General: Lids are normal. Allergic shiner present.        Right eye: No discharge.        Left eye: No discharge.     Conjunctiva/sclera: Conjunctivae normal.     Right eye: Right conjunctiva is not injected. No chemosis.    Left eye: Left conjunctiva is not injected. No chemosis.    Pupils: Pupils are equal, round, and reactive to light.  Cardiovascular:     Rate and Rhythm: Normal rate and regular rhythm.     Heart sounds: Normal heart sounds.  Pulmonary:     Effort: Pulmonary effort is normal. No tachypnea, accessory muscle usage or respiratory distress.     Breath  sounds: Normal breath sounds. No wheezing, rhonchi or rales.     Comments: Moving air well in all lung fields. No crackles.  Chest:     Chest wall: No tenderness.  Lymphadenopathy:     Cervical: No cervical adenopathy.  Skin:    General: Skin is warm.     Capillary Refill: Capillary refill takes less than 2 seconds.     Coloration: Skin is not pale.     Findings: No abrasion, erythema, petechiae or rash. Rash is not papular, urticarial or vesicular.     Comments: No eczematous or urticarial lesions noted.  Neurological:     Mental Status: She is alert.  Psychiatric:        Behavior: Behavior is cooperative.      Diagnostic studies:    Spirometry: results abnormal (FEV1: 2.01/84%, FVC: 2.39/85%, FEV1/FVC: 84%).    Spirometry consistent with normal pattern.   Allergy Studies: none       Salvatore Marvel, MD  [facility]

## 2022-07-19 ENCOUNTER — Telehealth: Payer: Self-pay | Admitting: *Deleted

## 2022-07-19 DIAGNOSIS — J4540 Moderate persistent asthma, uncomplicated: Secondary | ICD-10-CM

## 2022-07-19 NOTE — Telephone Encounter (Signed)
Patient has [place] [facility] and it requires trail and failure of Nucala. Also patient will need new CBC w/diff for approval to restart bio

## 2022-07-20 NOTE — Telephone Encounter (Signed)
Called patient - DOB verified  - advised of provider notation below.  3 mth f/u appt sched: 05/14 w/Dr. Ernst Bowler 2:30 p  Patient stated she will come next Thursday 07/26/22  @ 2:30 pm to have the lab work done.  Forwarding message to provider as update.

## 2022-07-20 NOTE — Telephone Encounter (Signed)
I ordered a CBC with diff. Can someone call the patient to let her know that she can go to a [facility] to get this drawn sometime or our office?    She also need a 3 month follow up appointment scheduled.   Salvatore Marvel, MD [facility] [place]

## 2022-07-25 NOTE — Telephone Encounter (Signed)
Ok we can just start Nucala.  Salvatore Marvel, MD [facility] [place]

## 2022-07-30 NOTE — Telephone Encounter (Signed)
Still waiting on CBC

## 2022-09-04 ENCOUNTER — Telehealth: Payer: Self-pay

## 2022-09-05 NOTE — Telephone Encounter (Signed)
Let's schedule her for a virtual visit to discuss. Thanks!   Thurston Hole has openings and is fine with video visits.   Malachi Bonds, MD [facility]

## 2022-09-05 NOTE — Telephone Encounter (Signed)
My chart message sent for patient to call us and schedule a visit of her choice

## 2022-09-07 NOTE — Telephone Encounter (Signed)
Patient scheduled for 09/25/2022

## 2022-09-13 ENCOUNTER — Ambulatory Visit (INDEPENDENT_AMBULATORY_CARE_PROVIDER_SITE_OTHER): Payer: 59 | Admitting: Nurse Practitioner

## 2022-09-13 ENCOUNTER — Other Ambulatory Visit (HOSPITAL_COMMUNITY)
Admission: RE | Admit: 2022-09-13 | Discharge: 2022-09-13 | Disposition: A | Discharge status: Home / Self Care | Payer: 59 | Source: Ambulatory Visit | Attending: Nurse Practitioner | Admitting: Nurse Practitioner

## 2022-09-13 VITALS — BP 110/70 | HR 63 | Temp 98.1°F | Resp 16 | Ht 60.0 in | Wt 215.2 lb

## 2022-09-13 DIAGNOSIS — N760 Acute vaginitis: Secondary | ICD-10-CM | POA: Diagnosis present

## 2022-09-13 DIAGNOSIS — R300 Dysuria: Secondary | ICD-10-CM | POA: Diagnosis not present

## 2022-09-13 LAB — POC URINALSYSI DIPSTICK (AUTOMATED)
Bilirubin, UA: NEGATIVE
Blood, UA: NEGATIVE
Glucose, UA: NEGATIVE
Ketones, UA: NEGATIVE
Leukocytes, UA: NEGATIVE
Nitrite, UA: NEGATIVE
Protein, UA: NEGATIVE
Spec Grav, UA: 1.02 (ref 1.010–1.025)
Urobilinogen, UA: 0.2 E.U./dL
pH, UA: 6 (ref 5.0–8.0)

## 2022-09-13 MED ORDER — METRONIDAZOLE 500 MG PO TABS: 500.0000 mg | ORAL_TABLET | Freq: Two times a day (BID) | ORAL | 0 refills | Status: AC

## 2022-09-13 NOTE — Patient Instructions (Addendum)
Uqora probiotics or Feminine balance probiotics or [place]le Probiotics or Florastor Probiotics.   Urine sent for culture Maintain adequate oral hydration and high fiber diet Start colace 1tab BID or miralax 17g once a day  May use boric acid vaginal suppository to decrease frequency of BV

## 2022-09-13 NOTE — Progress Notes (Signed)
Established Patient Visit  Patient: Danielle Velez   DOB: 1991/01/27   32 y.o. Female  MRN: [id number] Visit Date: 09/13/2022  Subjective:    Chief Complaint  Patient presents with   Vaginitis   Abdominal Pain    Pt c/o fishy odor and lower abdominal cramping    Dysuria  This is a new problem. The current episode started in the past 7 days. The problem has been unchanged. Quality: fullness. There has been no fever. She is Sexually active. There is No history of pyelonephritis. Associated symptoms include a discharge. Pertinent negatives include no chills, flank pain, frequency, hematuria, hesitancy, nausea, possible pregnancy, sweats, urgency or vomiting. She has tried increased fluids for the symptoms. The treatment provided no relief. There is no history of catheterization, kidney stones, recurrent UTIs, a single kidney, urinary stasis or a urological procedure.  Vaginal Discharge The patient's primary symptoms include a genital odor and vaginal discharge. The patient's pertinent negatives include no genital itching, genital lesions, genital rash, missed menses, pelvic pain or vaginal bleeding. This is a recurrent problem. The current episode started 1 to 4 weeks ago. The problem occurs intermittently. The problem has been unchanged. The patient is experiencing no pain. The problem affects both sides. She is not pregnant. Associated symptoms include constipation and dysuria. Pertinent negatives include no back pain, chills, diarrhea, discolored urine, fever, flank pain, frequency, hematuria, joint swelling, nausea, painful intercourse, rash, sore throat, urgency or vomiting. The vaginal bleeding is typical of menses. She has not been passing clots. She has not been passing tissue. The symptoms are aggravated by intercourse. She is sexually active. It is possible that her partner has an STD. Contraceptive use: implant. Her menstrual history has been regular. Her past medical history is  significant for herpes simplex and vaginosis. There is no history of PID or an STD.   Reviewed medical, surgical, and social history today  Medications: Outpatient Medications Prior to Visit  Medication Sig   albuterol (PROVENTIL) (2.5 MG/3ML) 0.083% nebulizer solution Take 3 mLs (2.5 mg total) by nebulization every 6 (six) hours as needed for wheezing or shortness of breath.   albuterol (VENTOLIN HFA) 108 (90 Base) MCG/ACT inhaler Inhale 2 puffs every 4 hours as needed for cough, wheeze, tightness in chest, or shortness of breath.   budesonide-formoterol (SYMBICORT) 160-4.5 MCG/ACT inhaler Inhale 2 puffs into the lungs in the morning and at bedtime.   cetirizine (ZYRTEC) 10 MG tablet Take 1 tablet (10 mg total) by mouth daily.   valACYclovir (VALTREX) 1000 MG tablet Take 1,000 mg by mouth daily.   No facility-administered medications prior to visit.   Reviewed past medical and social history.   ROS per HPI above      Objective:  BP 110/70 (BP Location: Right Arm, Patient Position: Sitting, Cuff Size: Large)   Pulse 63   Temp 98.1 F (36.7 C) (Temporal)   Resp 16   Ht 5' (1.524 m)   Wt 215 lb 3.2 oz (97.6 kg)   LMP 06/14/2021 (Approximate)   SpO2 98%   BMI 42.03 kg/m      Physical Exam Vitals reviewed.  Pulmonary:     Effort: Pulmonary effort is normal.  Abdominal:     General: There is no distension.     Tenderness: There is no abdominal tenderness. There is no right CVA tenderness, left CVA tenderness or guarding.  Neurological:     Mental  Status: She is alert and oriented to person, place, and time.     Results for orders placed or performed in visit on 09/13/22  POCT Urinalysis Dipstick (Automated)  Result Value Ref Range   Color, UA Light Yellow    Clarity, UA Clear    Glucose, UA Negative Negative   Bilirubin, UA Negative    Ketones, UA Negative    Spec Grav, UA 1.020 1.010 - 1.025   Blood, UA negative    pH, UA 6.0 5.0 - 8.0   Protein, UA Negative  Negative   Urobilinogen, UA 0.2 0.2 or 1.0 E.U./dL   Nitrite, UA Negative    Leukocytes, UA Negative Negative      Assessment & Plan:    Problem List Items Addressed This Visit   None Visit Diagnoses     Dysuria    -  Primary   Relevant Orders   POCT Urinalysis Dipstick (Automated) (Completed)   Urine Culture   Acute vaginitis       Relevant Medications   metroNIDAZOLE (FLAGYL) 500 MG tablet   Other Relevant Orders   Cervicovaginal ancillary only( [place])      Return in about 3 months (around 12/14/2022) for CPE (fasting).     Alysia Penna, NP

## 2022-09-14 LAB — CERVICOVAGINAL ANCILLARY ONLY
Bacterial Vaginitis (gardnerella): POSITIVE — AB
Candida Glabrata: NEGATIVE
Candida Vaginitis: NEGATIVE
Chlamydia: NEGATIVE
Comment: NEGATIVE
Comment: NEGATIVE
Comment: NEGATIVE
Comment: NEGATIVE
Comment: NEGATIVE
Comment: NORMAL
Neisseria Gonorrhea: NEGATIVE
Trichomonas: NEGATIVE

## 2022-09-15 LAB — URINE CULTURE
MICRO NUMBER:: 14905137
SPECIMEN QUALITY:: ADEQUATE

## 2022-09-25 ENCOUNTER — Encounter: Payer: Self-pay | Admitting: Allergy & Immunology

## 2022-09-25 ENCOUNTER — Other Ambulatory Visit: Payer: Self-pay

## 2022-09-25 ENCOUNTER — Ambulatory Visit (INDEPENDENT_AMBULATORY_CARE_PROVIDER_SITE_OTHER): Payer: 59 | Admitting: Allergy & Immunology

## 2022-09-25 VITALS — BP 100/70 | HR 60 | Temp 98.8°F | Resp 16 | Ht 60.0 in | Wt 212.5 lb

## 2022-09-25 DIAGNOSIS — J4540 Moderate persistent asthma, uncomplicated: Secondary | ICD-10-CM | POA: Diagnosis not present

## 2022-09-25 DIAGNOSIS — J3089 Other allergic rhinitis: Secondary | ICD-10-CM | POA: Diagnosis not present

## 2022-09-25 DIAGNOSIS — L501 Idiopathic urticaria: Secondary | ICD-10-CM | POA: Diagnosis not present

## 2022-09-25 DIAGNOSIS — J302 Other seasonal allergic rhinitis: Secondary | ICD-10-CM

## 2022-09-25 DIAGNOSIS — T7800XD Anaphylactic reaction due to unspecified food, subsequent encounter: Secondary | ICD-10-CM

## 2022-09-25 DIAGNOSIS — T7800XA Anaphylactic reaction due to unspecified food, initial encounter: Secondary | ICD-10-CM

## 2022-09-25 MED ORDER — BUDESONIDE-FORMOTEROL FUMARATE 160-4.5 MCG/ACT IN AERO: 2.0000 | INHALATION_SPRAY | Freq: Two times a day (BID) | RESPIRATORY_TRACT | 5 refills | Status: DC

## 2022-09-25 MED ORDER — ALBUTEROL SULFATE HFA 108 (90 BASE) MCG/ACT IN AERS: INHALATION_SPRAY | RESPIRATORY_TRACT | 1 refills | Status: DC

## 2022-09-25 MED ORDER — CETIRIZINE HCL 10 MG PO TABS: 10.0000 mg | ORAL_TABLET | Freq: Every day | ORAL | 5 refills | Status: DC

## 2022-09-25 MED ORDER — ALBUTEROL SULFATE (2.5 MG/3ML) 0.083% IN NEBU: 2.5000 mg | INHALATION_SOLUTION | Freq: Four times a day (QID) | RESPIRATORY_TRACT | 1 refills | Status: DC | PRN

## 2022-09-25 NOTE — Addendum Note (Signed)
Addended by: Kellie Simmering, Christhoper Busbee on: 09/25/2022 05:40 PM   Modules accepted: Orders

## 2022-09-25 NOTE — Progress Notes (Signed)
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  - We did get a complete blood count to see if we can get eosinophils high enough to start Nucala.  - Daily controller medication(s): Symbicort 160/4.5 two puffs twice daily with spacer - Rescue medications: albuterol 4 puffs every 4-6 hours as needed or albuterol nebulizer one vial puffs every 4-6 hours as needed - Asthma control goals:  * Full participation in all desired activities (may need albuterol before activity) * Albuterol use two time or less a week on average (not counting use with activity) * Cough interfering with sleep two time or less a month * Oral steroids no more than once a year * No hospitalizations  3. Allergic rhinitis - Continue with Zyrtec (cetirizine) 10mg  daily. - Continue with Flonase one spray per nostril daily.    4. Food allergies (shellfish, wheat, and cow's milk) - EpiPen is up to date.  - Continue to avoid your triggering foods.   5. Return in about 4 months (around 01/26/2023).   Subjective:   Danielle Velez is a 32 y.o. 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  Danielle Velez is a 32 y.o. female presenting for a follow up visit.  She was last seen in February 2024.  At that time, her spirometry looks stable.  We filled out paperwork to get Harrington Challenger approved again.  We continue with the Symbicort 2 puffs twice daily as well as albuterol.  For her allergic rhinitis, we continue with Zyrtec as well as Flonase.  Her EpiPen is up-to-date.  She continue to avoid shellfish as well as wheat and cows milk.  Since last visit, she has done well.   Asthma/Respiratory Symptom History: She remains on the Symbicort two puffs twice daily every day. It is free with both of her insurances.  She was running out of her albuterol at some point, but she is fine since being compliant with the Symbicort. She needs to get a CBC with differential. She remains interested in [place]. She has not been to the hospital and has not needed to be on prednisone at all for her symptoms.   She has not had any breakthrough episodes of pleurisy. She will massage the  area when it happens and stretch to help with pain control.   Allergic Rhinitis Symptom History: She has been using Benadeyl and cetirizine a bit more recently. She thinks that the weather changes make things a bit worse. She has been using her albuterol a bit more frequently.  She has been   Food Allergy Symptom History: She continues to avoid wheat and shellfish and dairy. There have been no accidental ingestions at all. She does have an EpiPen in place.  Skin  Symptom History: She has not been breaking out in hives for a while. She was previously on Xolair in the distant past for her urticaria. This is much better since she stopped consuming dairy.  Her last outbreak of hives was in March 2024.   She is working on getting a third job. She just got a second job with [employer] but she needs a third to make ends meet. She is only getting child support from one of her children's fathers.   Otherwise, there have been no changes to her past medical history, surgical history, family history, or social history.    Review of Systems  Constitutional: Negative.  Negative for chills, fever, malaise/fatigue and weight loss.  HENT:  Negative for congestion, ear discharge, ear pain and sinus pain.   Eyes:  Negative for pain, discharge and redness.  Respiratory:  Positive for cough and shortness of breath. Negative for sputum production and wheezing.   Cardiovascular: Negative.  Negative for chest pain and palpitations.  Gastrointestinal:  Negative for abdominal pain, constipation, diarrhea, heartburn, nausea and vomiting.  Skin: Negative.  Negative for itching and rash.  Neurological:  Negative for dizziness and headaches.  Endo/Heme/Allergies:  Positive for environmental allergies. Does not bruise/bleed easily.       Objective:   Blood pressure 100/70, pulse 60, temperature 98.8 F (37.1 C), resp. rate 16, height 5' (1.524 m), weight 212 lb 8 oz (96.4 kg), last menstrual period 06/14/2021, SpO2 97 %, not currently breastfeeding. Body mass index is 41.5 kg/m.    Physical Exam Vitals reviewed.  Constitutional:      Appearance: She is well-developed.     Comments: Pleasant female. Cooperative with the exam.   HENT:     Head: Normocephalic and atraumatic.     Right Ear: Tympanic membrane, ear canal and external ear normal.     Left Ear: Tympanic membrane, ear canal and external ear normal.     Nose: Rhinorrhea present. No nasal deformity, septal  deviation or mucosal edema.     Right Turbinates: Enlarged, swollen and pale.     Left Turbinates: Enlarged, swollen and pale.     Right Sinus: No maxillary sinus tenderness or frontal sinus tenderness.     Left Sinus: No maxillary sinus tenderness or frontal sinus tenderness.     Mouth/Throat:     Lips: Pink.     Mouth: Mucous membranes are moist. Mucous membranes are not pale and not dry.     Pharynx: Uvula midline.     Comments: Cobblestoning present in the posterior oropharynx.  Eyes:     General: Lids are normal. Allergic shiner present.        Right eye: No discharge.        Left eye: No discharge.     Conjunctiva/sclera: Conjunctivae normal.     Right eye: Right conjunctiva is not injected. No chemosis.    Left eye: Left conjunctiva is not injected. No chemosis.    Pupils: Pupils are equal, round,  and reactive to light.  Cardiovascular:     Rate and Rhythm: Normal rate and regular rhythm.     Heart sounds: Normal heart sounds.  Pulmonary:     Effort: Pulmonary effort is normal. No tachypnea, accessory muscle usage or respiratory distress.     Breath sounds: Normal breath sounds. No wheezing, rhonchi or rales.     Comments: Moving air well in all lung fields. No crackles.  Chest:     Chest wall: No tenderness.  Lymphadenopathy:     Cervical: No cervical adenopathy.  Skin:    General: Skin is warm.     Capillary Refill: Capillary refill takes less than 2 seconds.     Coloration: Skin is not pale.     Findings: No abrasion, erythema, petechiae or rash. Rash is not papular, urticarial or vesicular.     Comments: No eczematous or urticarial lesions noted.  Neurological:     Mental Status: She is alert.  Psychiatric:        Behavior: Behavior is cooperative.      Diagnostic studies:    Spirometry: results abnormal (FEV1: 1.77/74%, FVC: 2.46/88%, FEV1/FVC: 72%).    Spirometry consistent with mild obstructive disease.    Allergy Studies: none        Malachi Bonds, MD  [facility]

## 2022-09-25 NOTE — Addendum Note (Signed)
Addended by: Alfonse Spruce on: 09/25/2022 02:34 PM   Modules accepted: Orders

## 2022-09-25 NOTE — Patient Instructions (Addendum)
1. 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  - We did get a complete blood count to see if we can get eosinophils high enough to start Nucala.  - Daily controller medication(s): Symbicort 160/4.5 two puffs twice daily with spacer - Rescue medications: albuterol 4 puffs every 4-6 hours as needed or albuterol nebulizer one vial puffs every 4-6 hours as needed - Asthma control goals:  * Full participation in all desired activities (may need albuterol before activity) * Albuterol use two time or less a week on average (not counting use with activity) * Cough interfering with sleep two time or less a month * Oral steroids no more than once a year * No hospitalizations  3. Allergic rhinitis - Continue with Zyrtec (cetirizine) 10mg  daily. - Continue with Flonase one spray per nostril daily.    4. Food allergies (shellfish, wheat, and cow's milk) - EpiPen is up to date.  - Continue to avoid your triggering foods.   5. Return in about 4 months (around 01/26/2023).    Please inform us of any Emergency Department visits, hospitalizations, or changes in symptoms. Call us before going to the ED for breathing or allergy symptoms since we might be able to fit you in for a sick visit. Feel free to contact us anytime with any questions, problems, or concerns.  It was a pleasure to see you again today!  Websites that have reliable patient information: 1. American Academy of Asthma, Allergy, and Immunology: www.aaaai.org 2. Food Allergy Research and Education (FARE): foodallergy.org 3. Mothers of Asthmatics: http://www.asthmacommunitynetwork.org 4. American College of Allergy, Asthma, and Immunology: www.acaai.org   COVID-19 Vaccine Information can be found at: PodExchange.[email or web address] For questions related to vaccine distribution or appointments, please email [email or web address] or call  [phone].     "Like" [place] on Facebook and Instagram for our latest updates!        Make sure you are registered to vote! If you have moved or changed any of your contact information, you will need to get this updated before voting!  In some cases, you MAY be able to register to vote online: [email or web address]

## 2022-09-26 LAB — CBC WITH DIFFERENTIAL/PLATELET
Basophils Absolute: 0 10*3/uL (ref 0.0–0.2)
Basos: 1 %
EOS (ABSOLUTE): 0.2 10*3/uL (ref 0.0–0.4)
Eos: 3 %
Hematocrit: 42 % (ref 34.0–46.6)
Hemoglobin: 13.8 g/dL (ref 11.1–15.9)
Immature Grans (Abs): 0 10*3/uL (ref 0.0–0.1)
Immature Granulocytes: 0 %
Lymphocytes Absolute: 3 10*3/uL (ref 0.7–3.1)
Lymphs: 47 %
MCH: 27 pg (ref 26.6–33.0)
MCHC: 32.9 g/dL (ref 31.5–35.7)
MCV: 82 fL (ref 79–97)
Monocytes Absolute: 0.4 10*3/uL (ref 0.1–0.9)
Monocytes: 7 %
Neutrophils Absolute: 2.7 10*3/uL (ref 1.4–7.0)
Neutrophils: 42 %
Platelets: 371 10*3/uL (ref 150–450)
RBC: 5.12 x10E6/uL (ref 3.77–5.28)
RDW: 13.9 % (ref 11.7–15.4)
WBC: 6.4 10*3/uL (ref 3.4–10.8)

## 2022-10-07 ENCOUNTER — Ambulatory Visit (HOSPITAL_COMMUNITY)
Admission: EM | Admit: 2022-10-07 | Discharge: 2022-10-07 | Disposition: A | Discharge status: Home / Self Care | Payer: 59 | Attending: Emergency Medicine | Admitting: Emergency Medicine

## 2022-10-07 ENCOUNTER — Encounter (HOSPITAL_COMMUNITY): Payer: Self-pay | Admitting: Emergency Medicine

## 2022-10-07 DIAGNOSIS — J020 Streptococcal pharyngitis: Secondary | ICD-10-CM

## 2022-10-07 LAB — POCT RAPID STREP A (OFFICE): Rapid Strep A Screen: POSITIVE — AB

## 2022-10-07 MED ORDER — CEFDINIR 300 MG PO CAPS: 600.0000 mg | ORAL_CAPSULE | Freq: Every day | ORAL | 0 refills | Status: AC
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
## 2022-10-07 NOTE — ED Triage Notes (Signed)
Pt c/o sore throat that started today. Took Mucinex.

## 2022-10-07 NOTE — Discharge Instructions (Signed)
Your strep test was positive, I am covering you with antibiotics.  Please take all antibiotics as prescribed until finished.  For symptomatic management of your sore throat you can sleep with a humidifier, do warm saline gargles, tea with honey and popsicles.  Please alternate between Tylenol and ibuprofen for pain and fever.  Please return to clinic for any new or concerning symptoms.

## 2022-11-09 IMAGING — US US OB COMP LESS 14 WK
1 series · 14 of 28 positions shown · non-contrast
Comparison: December 07, 2016

CLINICAL DATA: Pelvic pain x2 days.

EXAM:
OBSTETRIC <14 WK ULTRASOUND
TECHNIQUE: Transabdominal ultrasound was performed for evaluation of the
gestation as well as the maternal uterus and adnexal regions.

[Series 1: us ob comp less 14 wk · 14 of 78 slices shown]
[im 3/78]
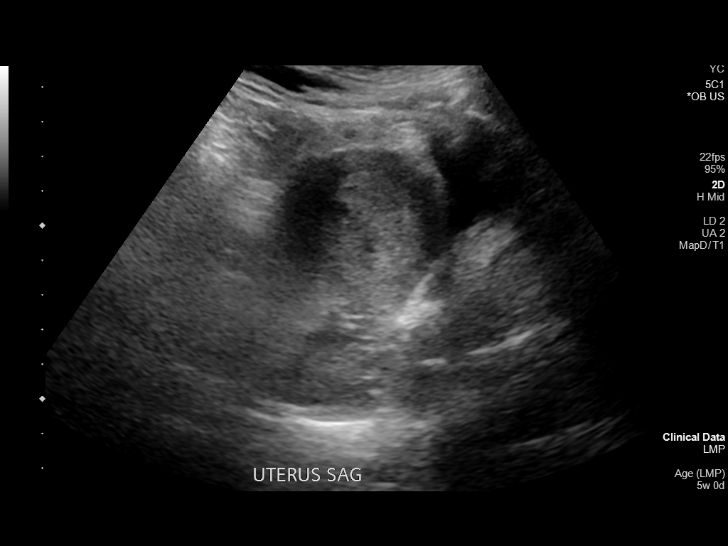
[im 9/78]
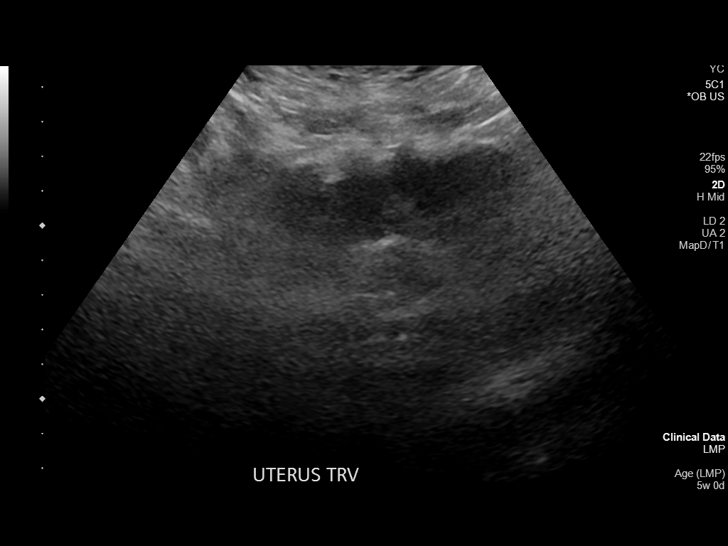
[im 15/78]
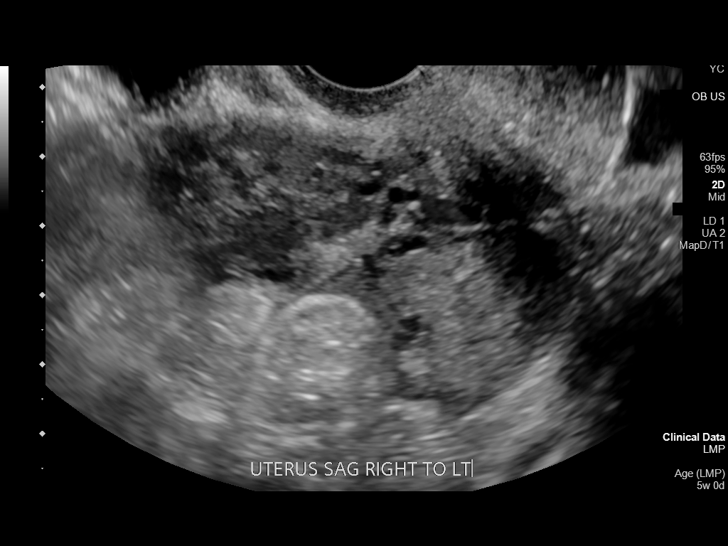
[im 20/78]
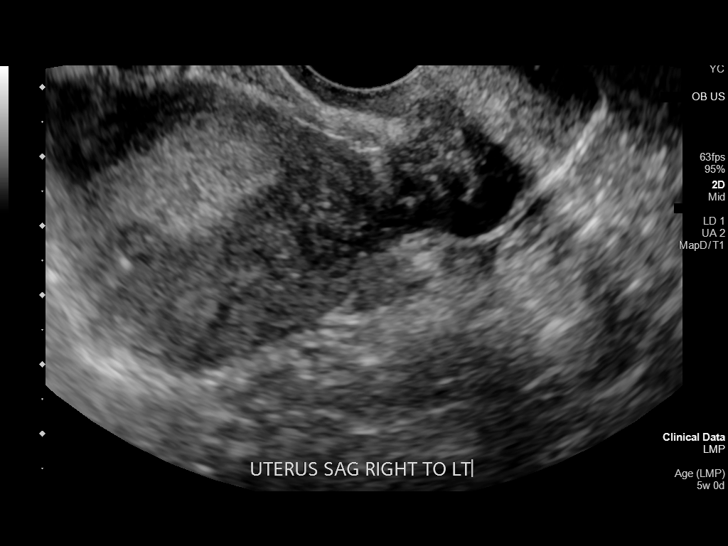
[im 26/78]
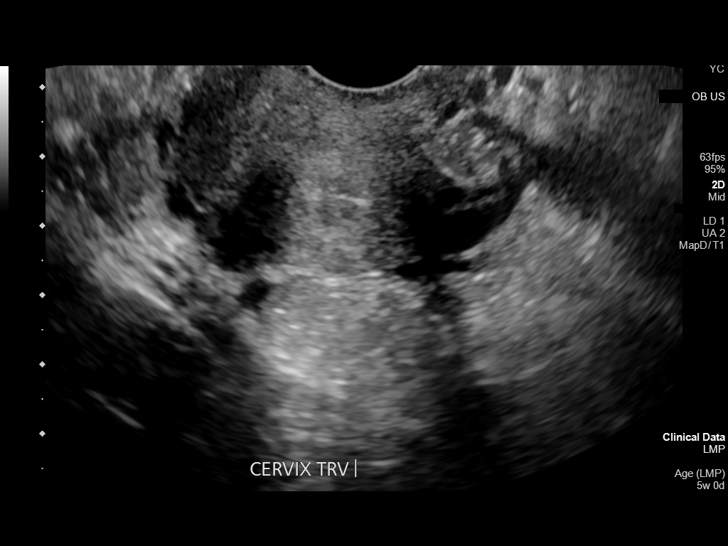
[im 32/78]
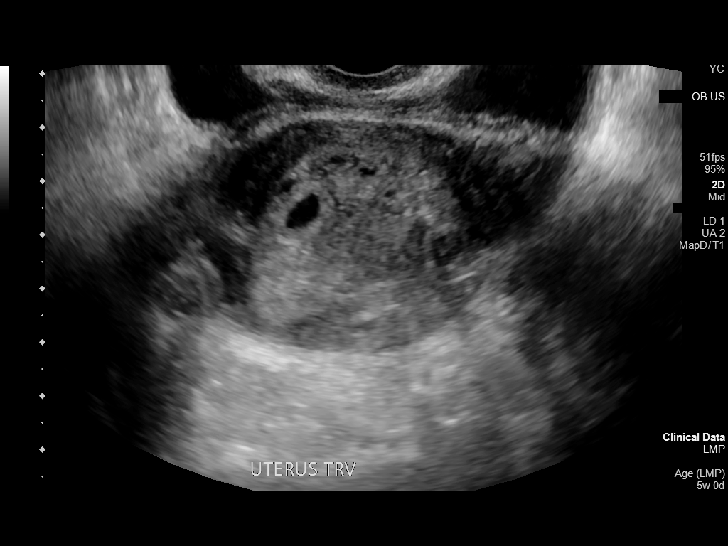
[im 38/78]
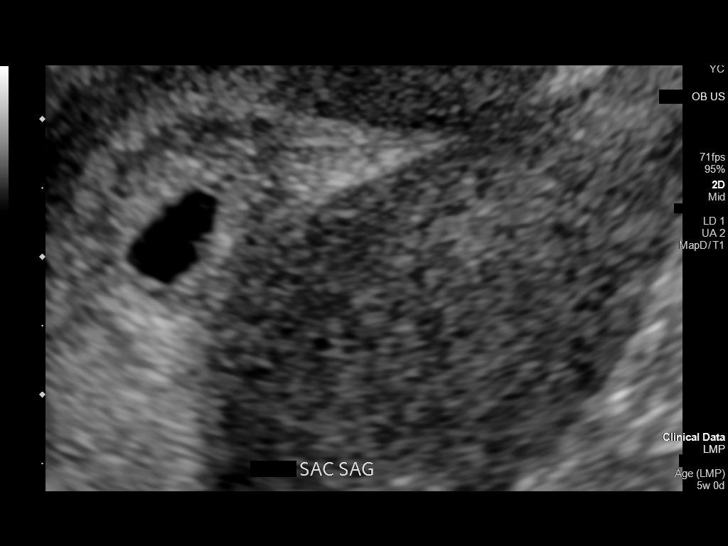
[im 43/78]
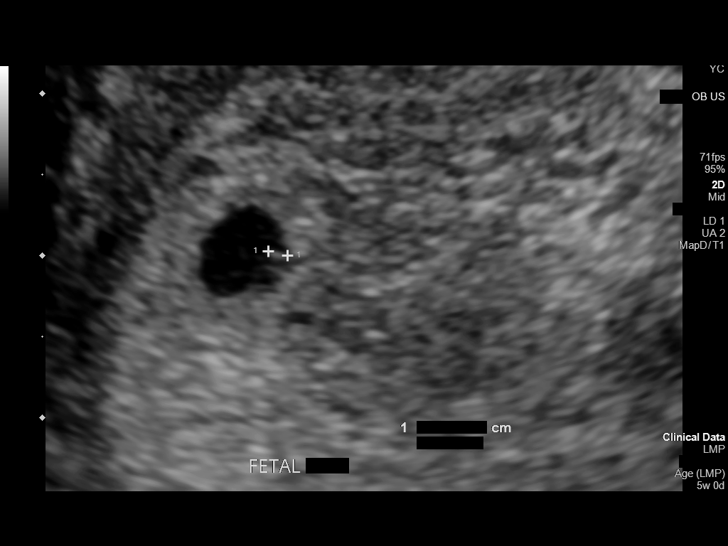
[im 49/78]
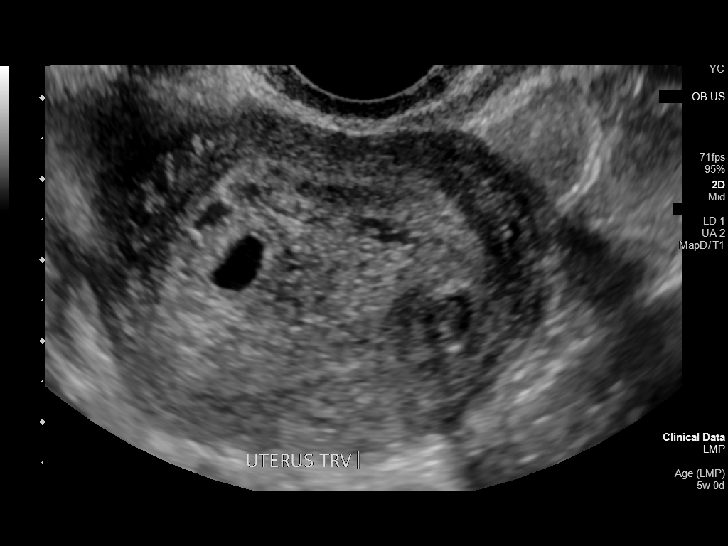
[im 55/78]
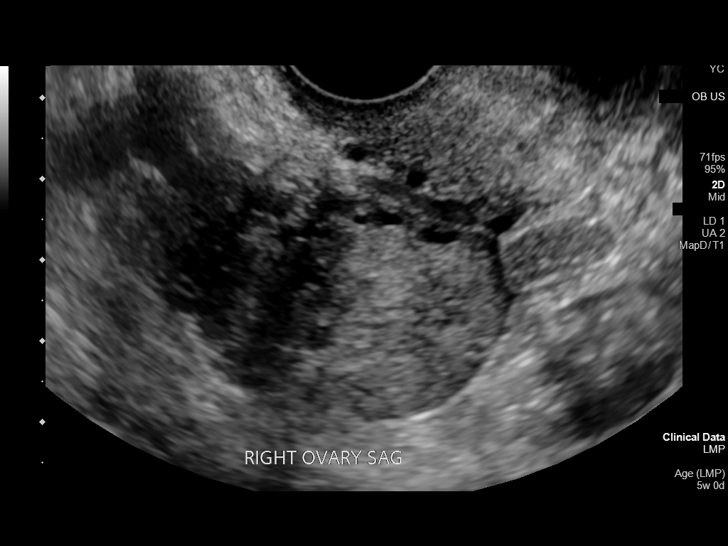
[im 60/78]
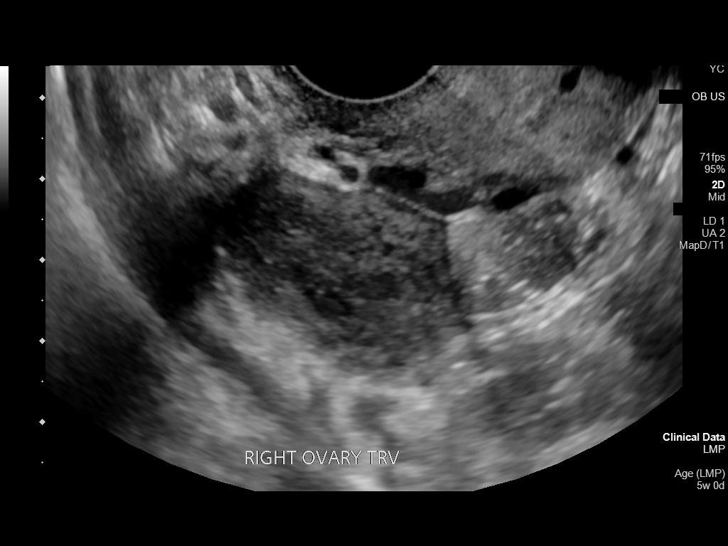
[im 66/78]
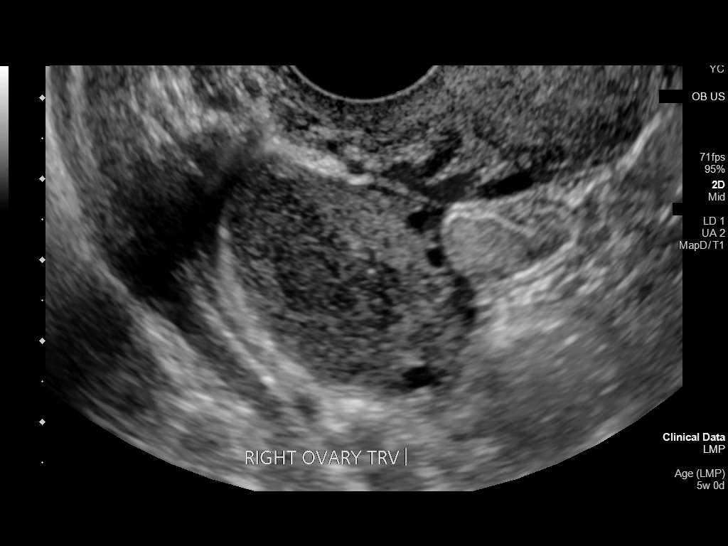
[im 72/78]
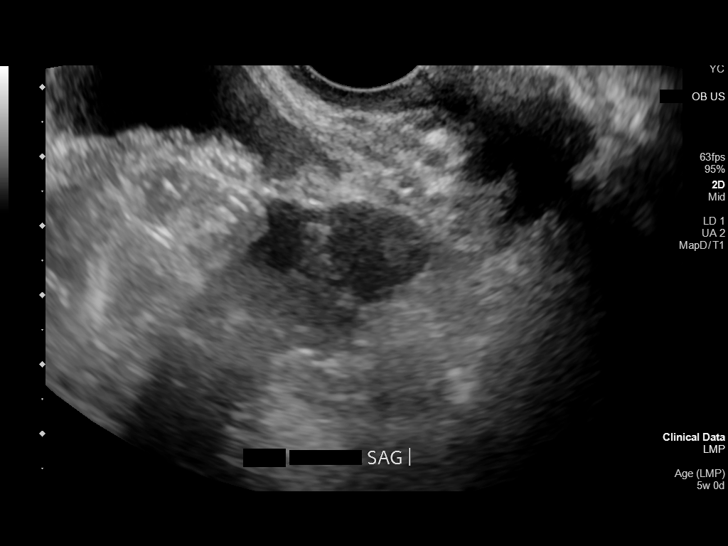
[im 78/78]
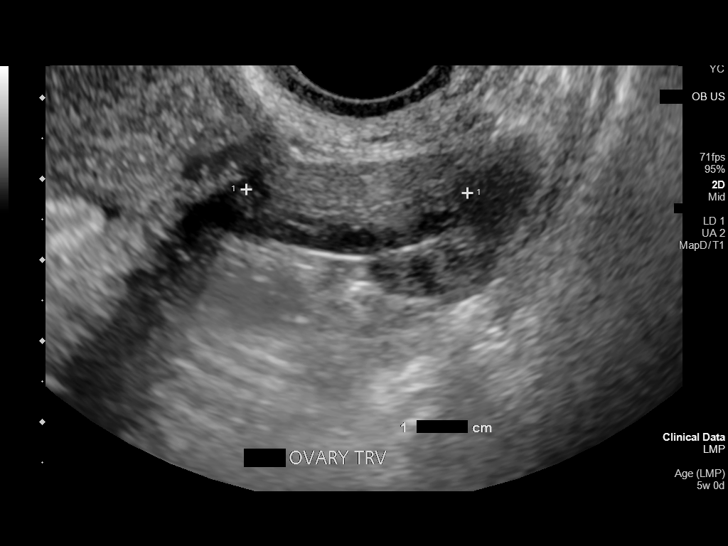

[14 of 28 positions shown; findings below may reference images not displayed]

FINDINGS: Intrauterine gestational sac: Single

Yolk sac:  Visualized.

Embryo:  Visualized.

Cardiac Activity: Not Visualized.

Heart Rate: N/A bpm

MSD:  5.7 mm   5 w   2 d

Subchorionic hemorrhage:  Small

Maternal uterus/adnexae: A 0.9 cm x 0.7 cm x 0.6 cm heterogeneous
uterine fibroid is suspected.

A 3.0 cm x 1.9 cm x 1.9 cm ill-defined hypoechoic area is seen
within the right ovary. No abnormal flow is seen within this region
on color Doppler evaluation.

The left ovary is visualized and is normal in appearance.

A small amount of pelvic free fluid is seen.
IMPRESSION: 1. Single intrauterine pregnancy, at approximately 5 weeks and 2
days gestation, without evidence of fetal cardiac activity. While
this may be secondary to early intrauterine pregnancy, correlation
with follow-up pelvic ultrasound is recommended.

## 2022-11-09 IMAGING — US US OB TRANSVAGINAL
1 series · 13 of 28 positions shown · non-contrast
Comparison: December 07, 2016

CLINICAL DATA: Pelvic pain.

EXAM:
OBSTETRIC <14 WK US AND TRANSVAGINAL OB US
TECHNIQUE: Both transabdominal and transvaginal ultrasound examinations were
performed for complete evaluation of the gestation as well as the
maternal uterus, adnexal regions, and pelvic cul-de-sac.
Transvaginal technique was performed to assess early pregnancy.

[Series 1: us ob transvaginal · 13 of 77 slices shown]
[im 3/77]
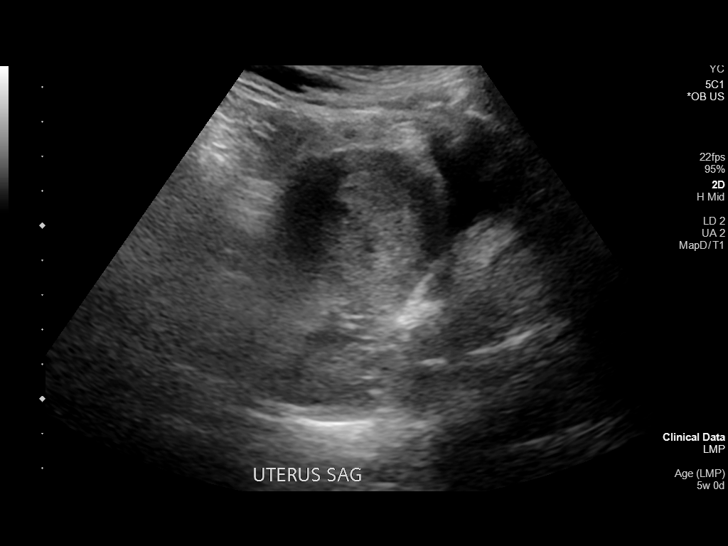
[im 9/77]
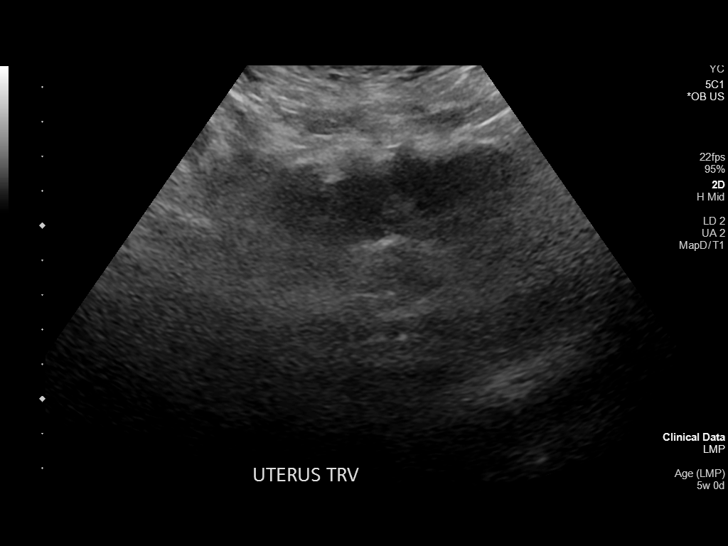
[im 15/77]
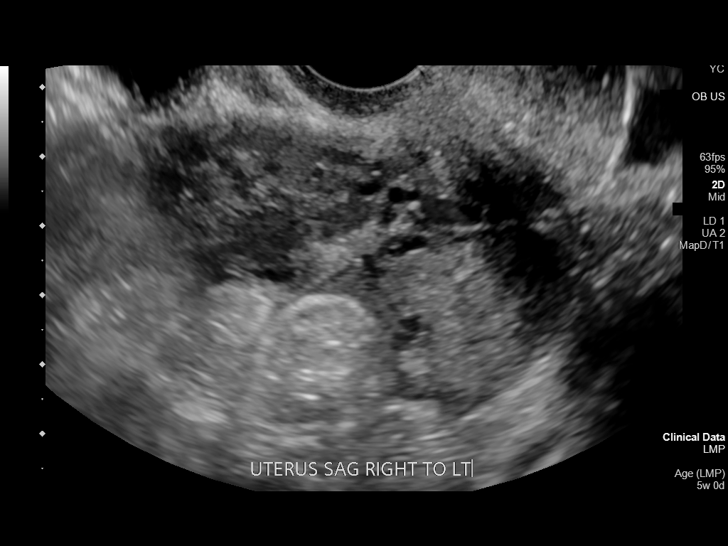
[im 20/77]
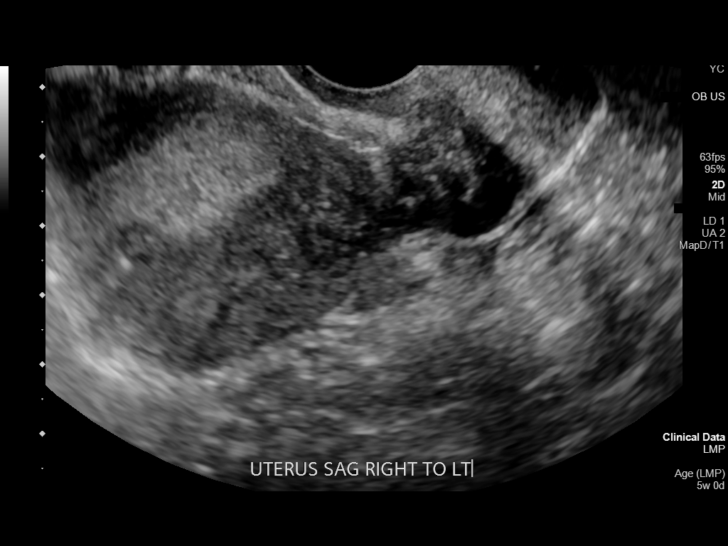
[im 26/77]
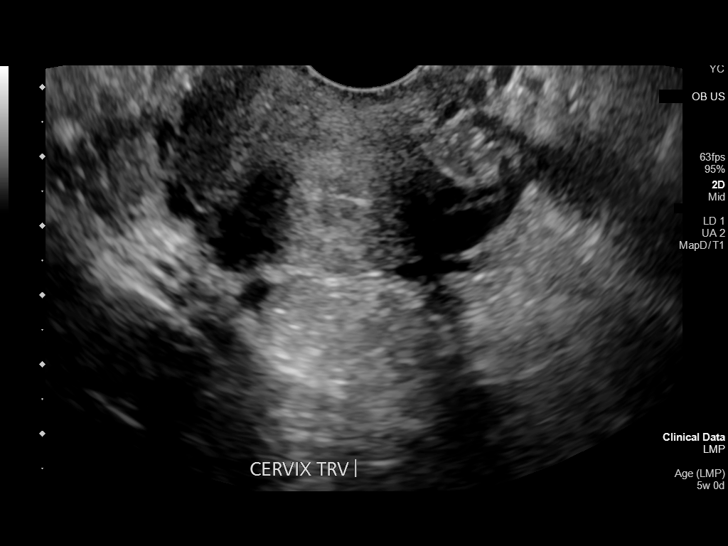
[im 31/77]
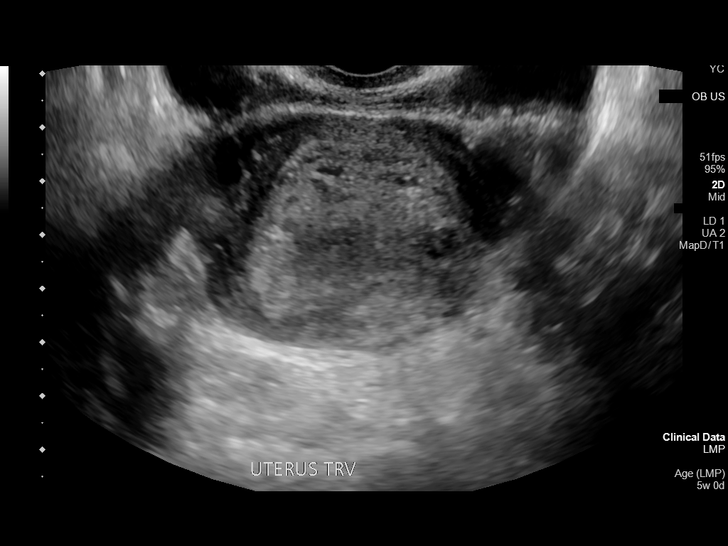
[im 40/77]
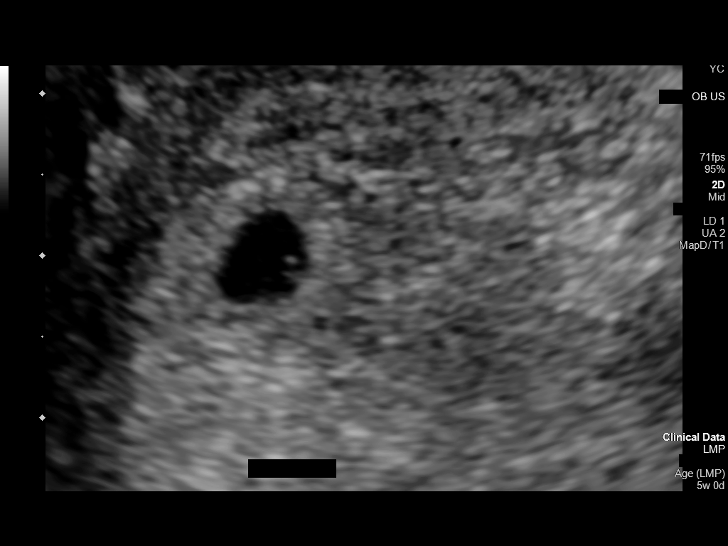
[im 46/77]
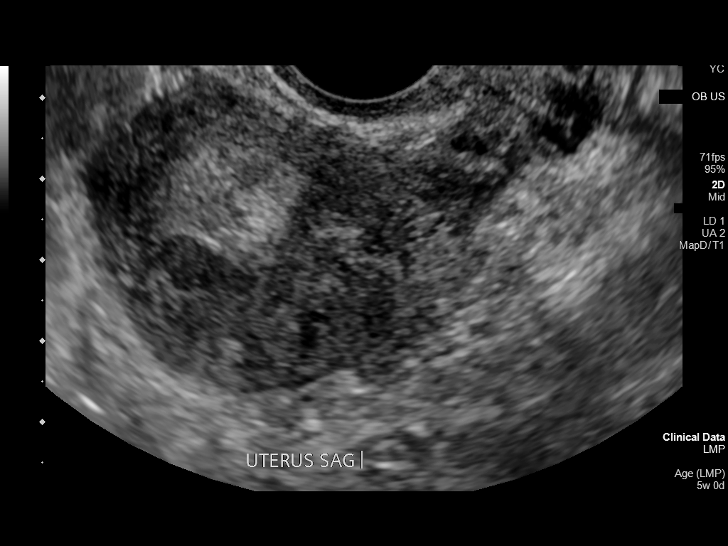
[im 51/77]
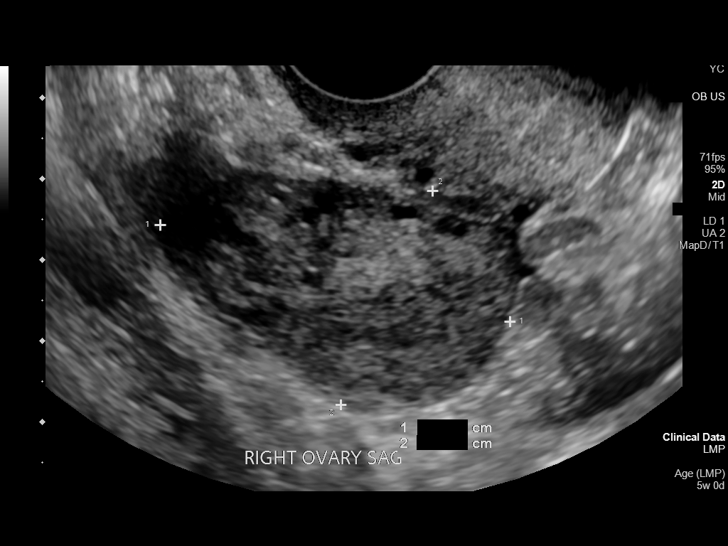
[im 57/77]
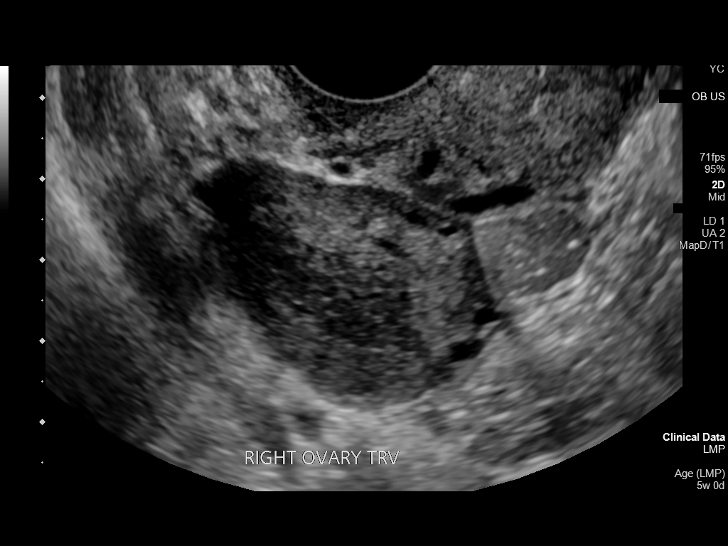
[im 62/77]
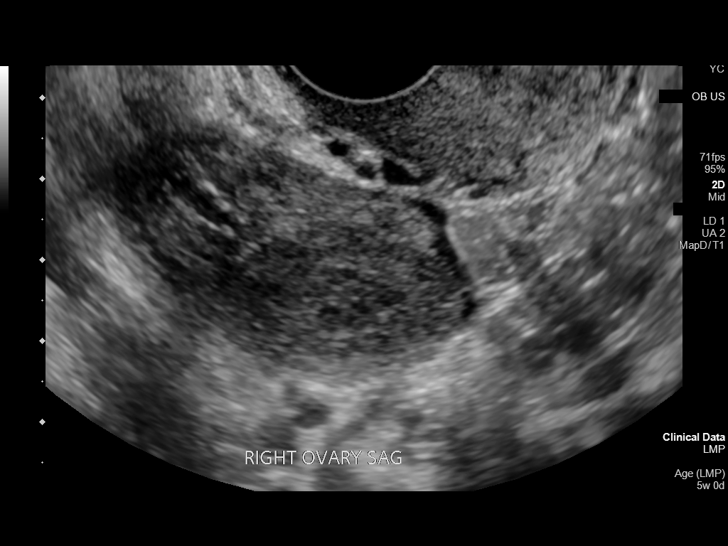
[im 68/77]
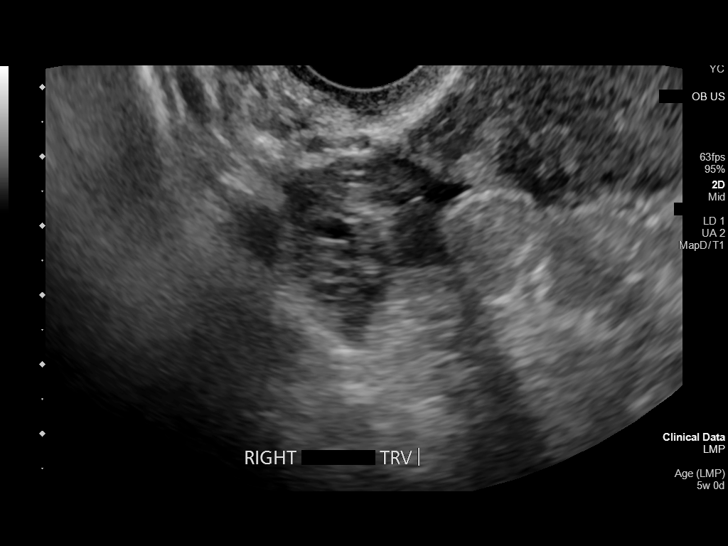
[im 74/77]
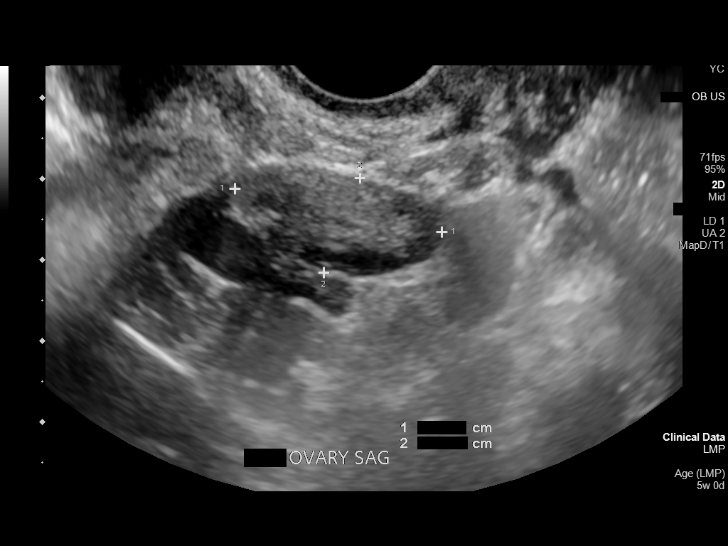

[13 of 28 positions shown; findings below may reference images not displayed]

FINDINGS: Intrauterine gestational sac: Single

Yolk sac:  Visualized.

Embryo:  Visualized.

Cardiac Activity: Not Visualized.

Heart Rate: N/A bpm

MSD: 5.7 mm   5 w   2 d

Subchorionic hemorrhage:  Small

Maternal uterus/adnexae: A 0.9 cm x 0.7 cm x 0.6 cm heterogeneous
uterine fibroid is suspected.

A 3.0 cm x 1.9 cm x 1.9 cm ill-defined hypoechoic area is seen
within the right ovary. No abnormal flow is seen within this region
on color Doppler evaluation.

The left ovary is visualized and is normal in appearance.

A small amount of pelvic free fluid is seen.
IMPRESSION: 1. Single intrauterine pregnancy, at approximately 5 weeks and 2
days gestation, without evidence of fetal cardiac activity. While
this may be secondary to early intrauterine pregnancy, correlation
with follow-up pelvic ultrasound is recommended.

## 2022-11-13 ENCOUNTER — Telehealth: Payer: Self-pay | Admitting: *Deleted

## 2022-11-13 NOTE — Telephone Encounter (Signed)
-----   Message from Alfonse Spruce, MD sent at 09/25/2022  2:32 PM EDT ----- CBC obtained today.

## 2022-11-13 NOTE — Telephone Encounter (Signed)
Called patient and advised approval and submit for Nucala preferred product per her Ins and submit to [employer]. Instructed on delivery, storage, dosing and initial dose in clinic with appt

## 2022-11-13 NOTE — Telephone Encounter (Signed)
Thanks, Tam Tam!   Miaya Lafontant, MD [facility]  

## 2022-11-14 IMAGING — US US OB < 14 WEEKS - US OB TV
1 series · 13 of 28 positions shown · non-contrast
Comparison: 09/12/2020.

CLINICAL DATA: Vaginal bleeding this morning. Patient is 5 weeks
and 5 days pregnant based on her last menstrual period. Current beta
HCG level is [DATE].



[Series 1: us ob < 14 weeks - us ob tv · 13 of 98 slices shown]
[im 4/98]
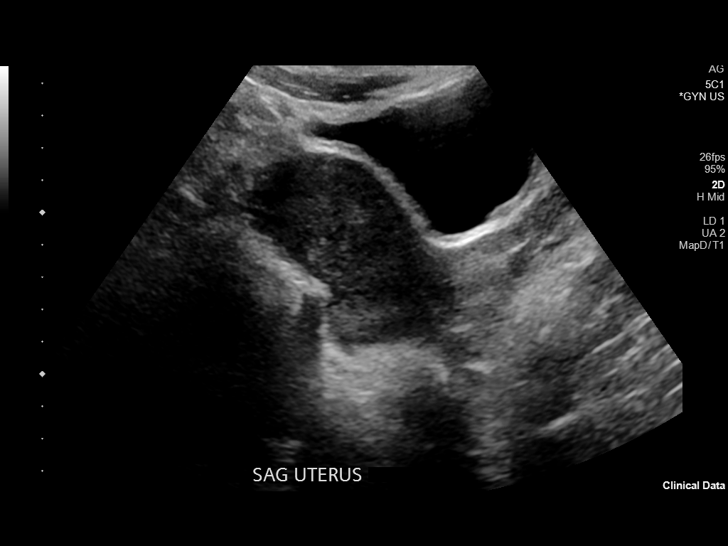
[im 11/98]
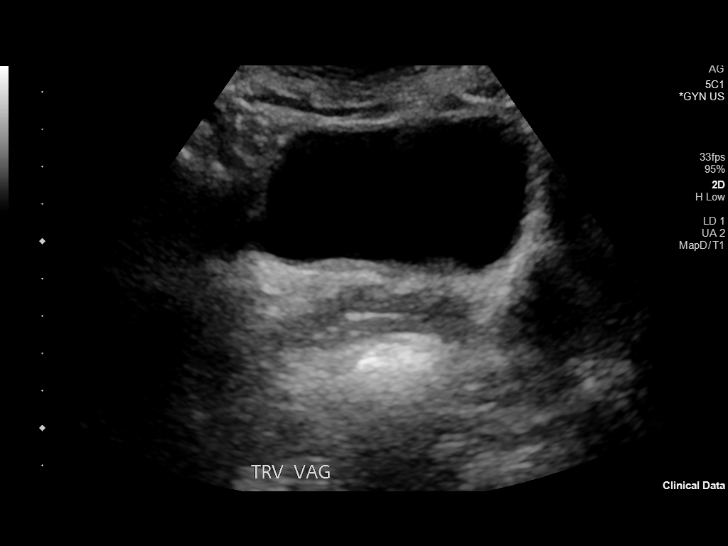
[im 18/98]
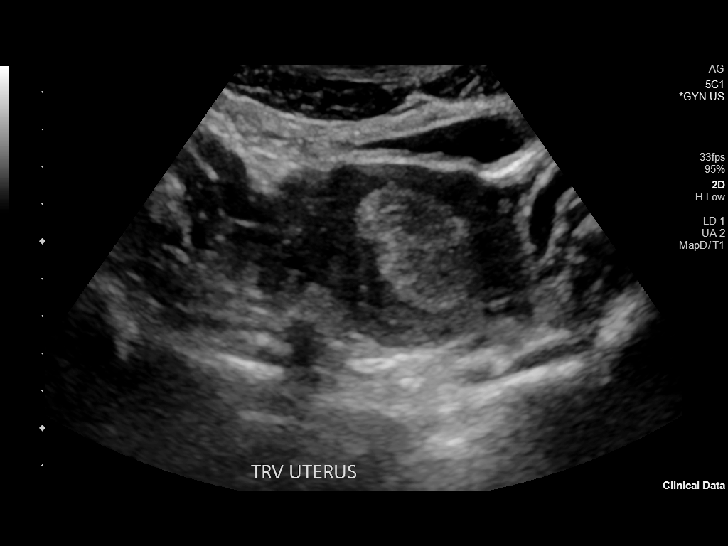
[im 26/98]
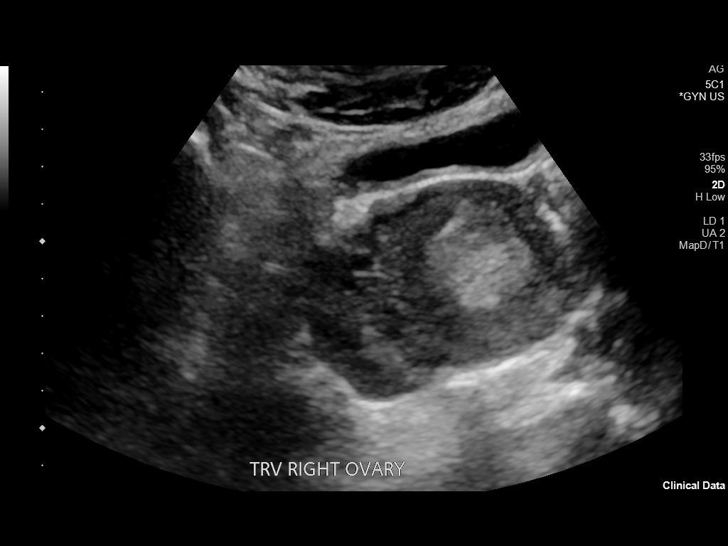
[im 33/98]
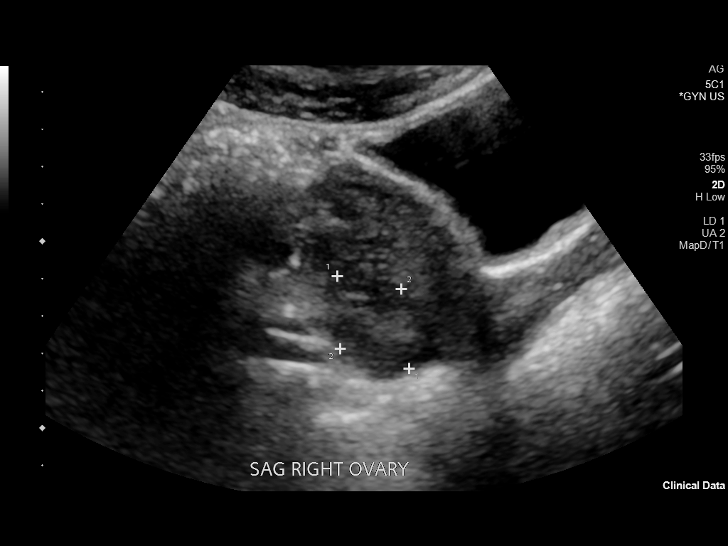
[im 40/98]
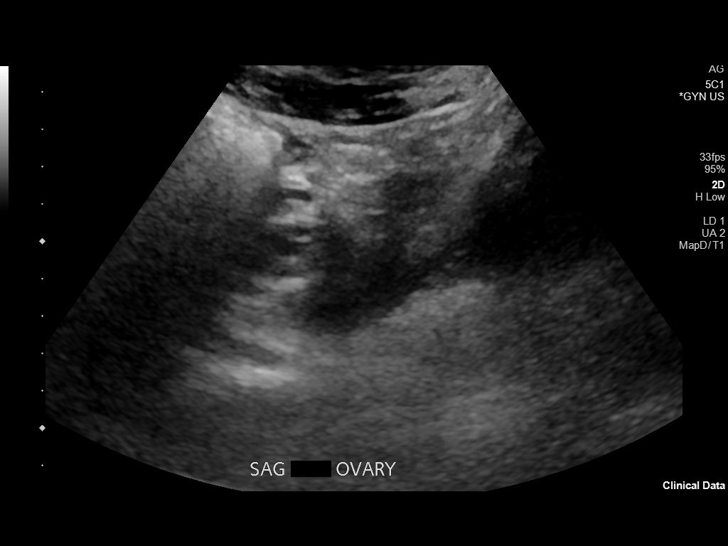
[im 51/98]
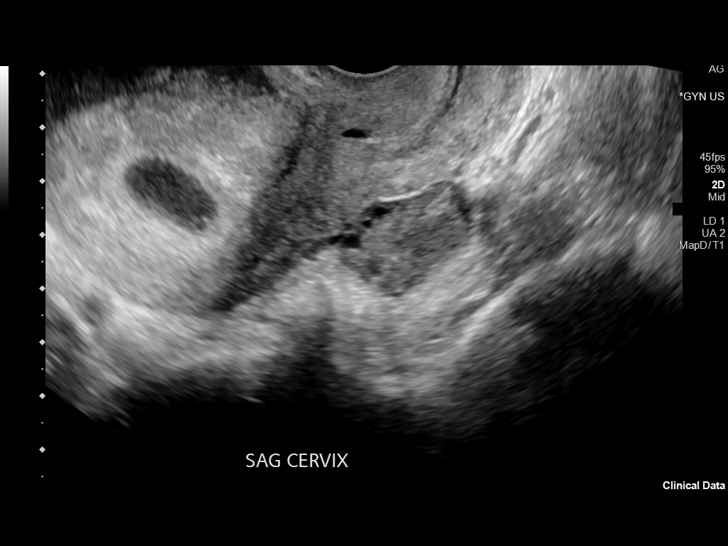
[im 58/98]
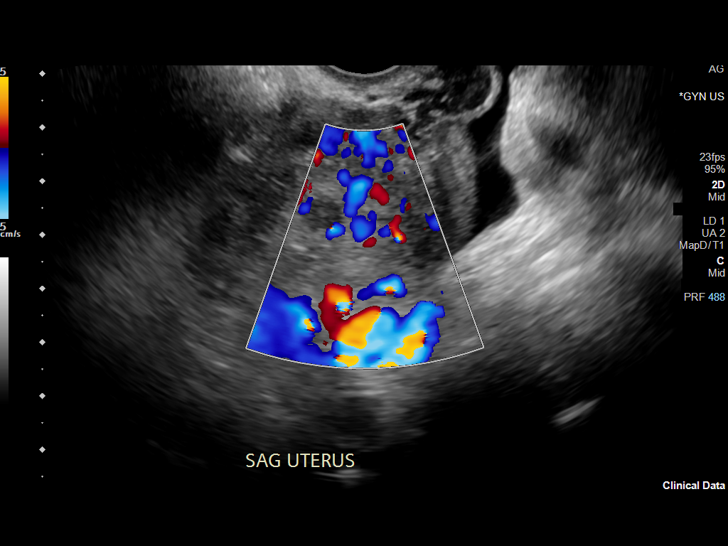
[im 65/98]
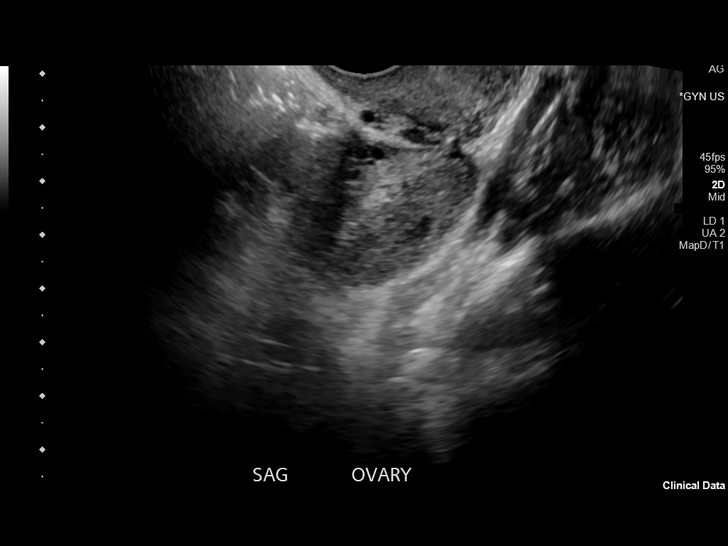
[im 72/98]
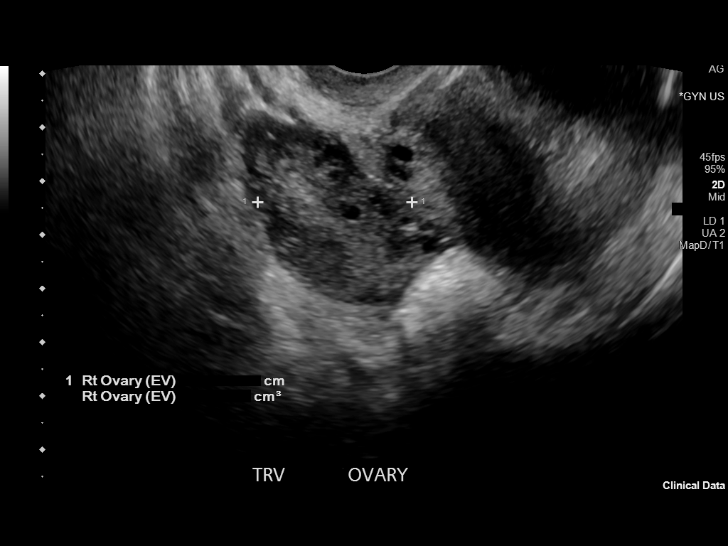
[im 80/98]
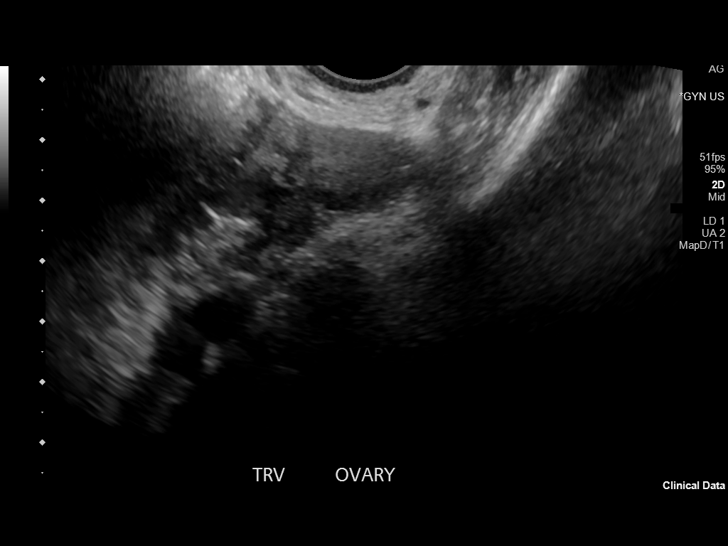
[im 87/98]
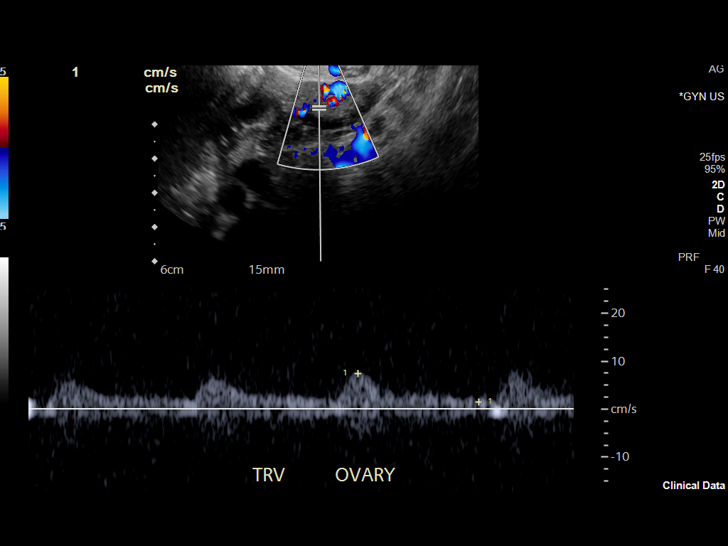
[im 94/98]
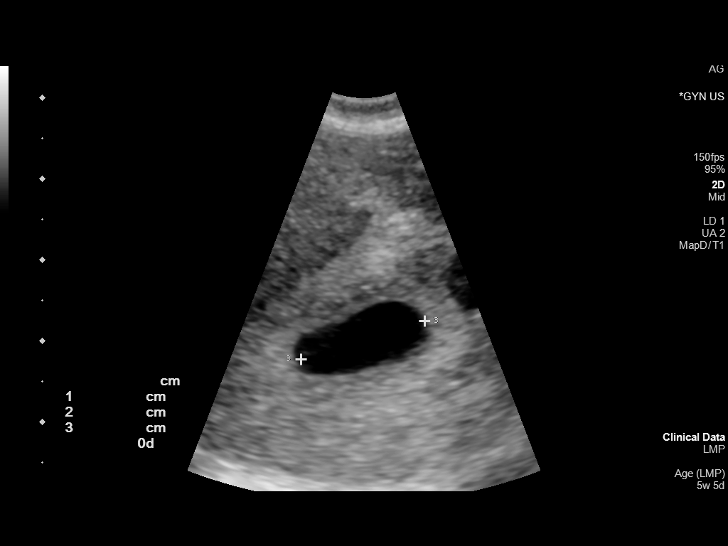

[13 of 28 positions shown; findings below may reference images not displayed]

FINDINGS: Intrauterine gestational sac: Single

Yolk sac:  Visualized.

Embryo:  Not Visualized.

Cardiac Activity: Not Visualized.

MSD: 12 mm   6 w   0 d

Subchorionic hemorrhage:  Small subchronic hemorrhage.

Maternal uterus/adnexae: Small fibroid noted on the prior exam not
well-defined currently. Cervix is closed. Right ovary with evidence
of a corpus luteum, otherwise unremarkable. Normal left ovary. No
adnexal masses. Small amount of pelvic free fluid.

Pulsed Doppler evaluation of both ovaries demonstrates normal
appearing low-resistance arterial and venous waveforms.
IMPRESSION: 1. Since the prior exam, the gestational sac has increased in size,
but the small embryo noted on the previous exam is no longer seen.
This is concerning for a failed intrauterine pregnancy, but not
definitive, since the apparent embryo was small and not absolutely
definitive. There is a persistent yolk sac. A small subchronic
hemorrhage is again noted. Recommend follow-up beta HCG levels and
repeat ultrasound in 10-14 days to reassess for normal progression.
2. Previously seen right ovarian corpus luteum is similar to the
prior exam.
3. Small amount of pelvic free fluid appears increased from the
previous study.

## 2023-01-11 ENCOUNTER — Ambulatory Visit: Payer: 59 | Admitting: Nurse Practitioner

## 2023-01-11 ENCOUNTER — Encounter: Payer: Self-pay | Admitting: Nurse Practitioner

## 2023-01-11 DIAGNOSIS — M542 Cervicalgia: Secondary | ICD-10-CM

## 2023-01-11 DIAGNOSIS — M549 Dorsalgia, unspecified: Secondary | ICD-10-CM | POA: Diagnosis not present

## 2023-01-11 DIAGNOSIS — R0789 Other chest pain: Secondary | ICD-10-CM

## 2023-01-11 MED ORDER — PREDNISONE 10 MG (21) PO TBPK
ORAL_TABLET | ORAL | 0 refills | Status: DC
Start: 2023-01-11 — End: 2023-03-13

## 2023-01-11 MED ORDER — KETOROLAC TROMETHAMINE 30 MG/ML IJ SOLN
30.0000 mg | Freq: Once | INTRAMUSCULAR | Status: AC
Start: 2023-01-11 — End: 2023-01-11
  Administered 2023-01-11: 30 mg via INTRAMUSCULAR
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
## 2023-01-11 NOTE — Patient Instructions (Signed)
Continue muscle relaxant at bedtime. Start oral prednisone as prescribed and tylenol 650mg  every 8hrs. Take with food Ok to resume use of advil 400mg  every 6hrs after completion of oral prednisone. Use warm compress 3-4x/day.  Musculoskeletal Pain Musculoskeletal pain refers to aches and pains in your bones, joints, muscles, and the tissues that surround them. This pain can occur in any part of the body. It can last for a short time (acute) or a long time (chronic). A physical exam, lab tests, and imaging studies may be done to find the cause of your musculoskeletal pain. Follow these instructions at home: Lifestyle Try to control or lower your stress levels. Stress increases muscle tension and can worsen musculoskeletal pain. It is important to recognize when you are anxious or stressed and learn ways to manage it. This may include: Meditation or yoga. Cognitive or behavioral therapy. Acupuncture or massage therapy. You may continue all activities unless the activities cause more pain. When the pain gets better, slowly resume your normal activities. Gradually increase the intensity and duration of your activities or exercise. Managing pain, stiffness, and swelling     Treatment may include medicines for pain and inflammation that are taken by mouth or applied to the skin. Take over-the-counter and prescription medicines only as told by your health care provider. When your pain is severe, bed rest may be helpful. Lie or sit in any position that is comfortable, but get out of bed and walk around at least every couple of hours. If directed, apply heat to the affected area as often as told by your health care provider. Use the heat source that your health care provider recommends, such as a moist heat pack or a heating pad. Place a towel between your skin and the heat source. Leave the heat on for 20-30 minutes. Remove the heat if your skin turns bright red. This is especially important if you  are unable to feel pain, heat, or cold. You may have a greater risk of getting burned. If directed, put ice on the painful area. To do this: Put ice in a plastic bag. Place a towel between your skin and the bag. Leave the ice on for 20 minutes, 2-3 times a day. Remove the ice if your skin turns bright red. This is very important. If you cannot feel pain, heat, or cold, you have a greater risk of damage to the area. General instructions Your health care provider may recommend that you see a physical therapist. This person can help you come up with a safe exercise program. If told by your health care provider, do physical therapy exercises to improve movement and strength in the affected area. Keep all follow-up visits. This is important. This includes any physical therapy visits. Contact a health care provider if: Your pain gets worse. Medicines do not help ease your pain. You cannot use the part of your body that hurts, such as your arm, leg, or neck. You have trouble sleeping. You have trouble doing your normal activities. Get help right away if: You have a new injury and your pain is worse or different. You feel numb or you have tingling in the painful area. Summary Musculoskeletal pain refers to aches and pains in your bones, joints, muscles, and the tissues that surround them. This pain can occur in any part of the body. Your health care provider may recommend that you see a physical therapist. This person can help you come up with a safe exercise program. Do any  exercises as told by your physical therapist. Lower your stress level. Stress can worsen musculoskeletal pain. Ways to lower stress may include meditation, yoga, cognitive or behavioral therapy, acupuncture, and massage therapy. This information is not intended to replace advice given to you by your health care provider. Make sure you discuss any questions you have with your health care provider. Document Revised: 09/03/2019  Document Reviewed: 08/12/2019 Elsevier Patient Education  2024 [employer].
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
## 2023-01-11 NOTE — Progress Notes (Signed)
After obtaining consent, and per orders of Alysia Penna NP , injection of Toradol 30 mg  given by Olga Coaster. Patient instructed to remain in clinic for 20 minutes afterwards, and to report any adverse reaction to me immediately.

## 2023-01-15 ENCOUNTER — Telehealth: Payer: Self-pay | Admitting: Allergy & Immunology

## 2023-01-17 NOTE — Telephone Encounter (Signed)
Patient came into the office with her son. She asked me to contact Tammy to let her know that she does want to do her injections in the office - either Nucala or Harrington Challenger is fine. I think she has been on Nucala in the past.   Malachi Bonds, MD [facility]

## 2023-01-21 NOTE — Telephone Encounter (Signed)
Called patient and advised I had spoken to her 3 weeks back and advised pharmacy trying to reach her and she needs to call them. Currently she is inactive to them after 3 attempts to reach her so she will need to make the effort

## 2023-01-22 NOTE — Telephone Encounter (Signed)
I called the patient back and LVM instructing her to call the pharmacy, reiterating everything that Tammy had mentioned.   Malachi Bonds, MD [facility]

## 2023-01-23 ENCOUNTER — Other Ambulatory Visit: Payer: Self-pay | Admitting: Nurse Practitioner

## 2023-01-23 ENCOUNTER — Ambulatory Visit (INDEPENDENT_AMBULATORY_CARE_PROVIDER_SITE_OTHER): Payer: 59 | Admitting: Nurse Practitioner

## 2023-01-23 ENCOUNTER — Encounter: Payer: Self-pay | Admitting: Nurse Practitioner

## 2023-01-23 DIAGNOSIS — G44319 Acute post-traumatic headache, not intractable: Secondary | ICD-10-CM | POA: Diagnosis not present

## 2023-01-23 MED ORDER — TRAMADOL-ACETAMINOPHEN 37.5-325 MG PO TABS: 1.0000 | ORAL_TABLET | Freq: Three times a day (TID) | ORAL | 0 refills | Status: AC | PRN

## 2023-01-23 NOTE — Assessment & Plan Note (Addendum)
Reports persistent neck and back pain despite use of naproxen, flexeril and robaxin. Has minimal relief Did not start oral prednisone as prescribed. Also reports new onset of headache associated with nausea and light sensitivity. Denies any change in vision or confusion or focal weakness. Denies any hx of migraine headache.  Ordered head CT Advised to start and complete oral prednisone Maintain tylenol 500mg  every 8hrs and warm compress 3x/day Use ultracet for severe pain Requested records from urgent care to review neck x-ray report Consider outpatient PT if normal CT

## 2023-01-23 NOTE — Patient Instructions (Signed)
Complete oral prednisone as prescribed Take with tylenol 500mg  every 8hrs. Use warm compress 2-3x/day. You will be contacted to schedule appointment for head CT Sign medical release to get records from urgent care.  Tension Headache, Adult A tension headache is a feeling of pain, pressure, or aching over the front and sides of the head. The pain can be dull, or it can feel tight. There are two types of tension headache: Episodic tension headache. This is when the headaches happen fewer than 15 days a month. Chronic tension headache. This is when the headaches happen more than 15 days a month during a 22-month period. A tension headache can last from 30 minutes to several days. It is the most common kind of headache. Tension headaches are not normally associated with nausea or vomiting, and they do not get worse with physical activity. What are the causes? The exact cause of this condition is not known. Tension headaches are often triggered by stress, anxiety, or depression. Other triggers may include: Alcohol. Too much caffeine or caffeine withdrawal. Respiratory infections, such as colds, flu, or sinus infections. Dental problems or teeth clenching. Fatigue. Holding your head and neck in the same position for a long period of time, such as while using a computer. Smoking. Arthritis of the neck. What are the signs or symptoms? Symptoms of this condition include: A feeling of pressure or tightness around the head. Dull, aching head pain. Pain over the front and sides of the head. Tenderness in the muscles of the head, neck, and shoulders. How is this diagnosed? This condition may be diagnosed based on your symptoms, your medical history, and a physical exam. If your symptoms are severe or unusual, you may have imaging tests, such as a CT scan or an MRI of your head. Your vision may also be checked. How is this treated? This condition may be treated with lifestyle changes and with  medicines that help relieve symptoms. Follow these instructions at home: Managing pain Take over-the-counter and prescription medicines only as told by your health care provider. When you have a headache, lie down in a dark, quiet room. If directed, put ice on your head and neck. To do this: Put ice in a plastic bag. Place a towel between your skin and the bag. Leave the ice on for 20 minutes, 2-3 times a day. Remove the ice if your skin turns bright red. This is very important. If you cannot feel pain, heat, or cold, you have a greater risk of damage to the area. If directed, apply heat to the back of your neck as often as told by your health care provider. Use the heat source that your health care provider recommends, such as a moist heat pack or a heating pad. Place a towel between your skin and the heat source. Leave the heat on for 20-30 minutes. Remove the heat if your skin turns bright red. This is especially important if you are unable to feel pain, heat, or cold. You have a greater risk of getting burned. Eating and drinking Eat meals on a regular schedule. If you drink alcohol: Limit how much you have to: 0-1 drink a day for women who are not pregnant. 0-2 drinks a day for men. Know how much alcohol is in your drink. In the [place] one drink equals one 12 oz bottle of beer (355 mL), one 5 oz glass of wine (148 mL), or one 1 oz glass of hard liquor (44 mL). Drink enough fluid to  keep your urine pale yellow. Decrease your caffeine intake, or stop using caffeine. Lifestyle Get 7-9 hours of sleep each night, or get the amount of sleep recommended by your health care provider. At bedtime, remove computers, phones, and tablets from your room. Find ways to manage your stress. This may include: Exercise. Deep breathing exercises. Yoga. Listening to music. Positive mental imagery. Try to sit up straight and avoid tensing your muscles. Do not use any products that contain nicotine or  tobacco. These include cigarettes, chewing tobacco, and vaping devices, such as e-cigarettes. If you need help quitting, ask your health care provider. General instructions  Avoid any headache triggers. Keep a journal to help find out what may trigger your headaches. For example, write down: What you eat and drink. How much sleep you get. Any change to your diet or medicines. Keep all follow-up visits. This is important. Contact a health care provider if: Your headache does not get better. Your headache comes back. You are sensitive to sounds, light, or smells because of a headache. You have nausea or you vomit. Your stomach hurts. Get help right away if: You suddenly develop a severe headache, along with any of the following: A stiff neck. Nausea and vomiting. Confusion. Weakness in one part or one side of your body. Double vision or loss of vision. Shortness of breath. Rash. Unusual sleepiness. Fever or chills. Trouble speaking. Pain in your eye or ear. Trouble walking or balancing. Feeling faint or passing out. Summary A tension headache is a feeling of pain, pressure, or aching over the front and sides of the head. A tension headache can last from 30 minutes to several days. It is the most common kind of headache. This condition may be diagnosed based on your symptoms, your medical history, and a physical exam. This condition may be treated with lifestyle changes and with medicines that help relieve symptoms. This information is not intended to replace advice given to you by your health care provider. Make sure you discuss any questions you have with your health care provider. Document Revised: 01/28/2020 Document Reviewed: 01/28/2020 Elsevier Patient Education  2024 [employer].
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
## 2023-01-24 ENCOUNTER — Other Ambulatory Visit: Payer: 59

## 2023-01-25 ENCOUNTER — Encounter: Payer: Self-pay | Admitting: Nurse Practitioner

## 2023-01-25 ENCOUNTER — Other Ambulatory Visit: Payer: Self-pay | Admitting: Nurse Practitioner

## 2023-01-25 ENCOUNTER — Other Ambulatory Visit: Payer: 59

## 2023-01-28 ENCOUNTER — Ambulatory Visit (INDEPENDENT_AMBULATORY_CARE_PROVIDER_SITE_OTHER): Payer: 59

## 2023-01-28 ENCOUNTER — Telehealth: Payer: Self-pay | Admitting: Nurse Practitioner

## 2023-01-28 ENCOUNTER — Other Ambulatory Visit: Payer: Self-pay | Admitting: Nurse Practitioner

## 2023-01-28 DIAGNOSIS — R519 Headache, unspecified: Secondary | ICD-10-CM

## 2023-01-28 NOTE — Telephone Encounter (Signed)
See note

## 2023-01-29 ENCOUNTER — Ambulatory Visit (INDEPENDENT_AMBULATORY_CARE_PROVIDER_SITE_OTHER): Payer: 59 | Admitting: Allergy & Immunology

## 2023-01-29 ENCOUNTER — Other Ambulatory Visit: Payer: Self-pay

## 2023-01-29 ENCOUNTER — Encounter: Payer: Self-pay | Admitting: Allergy & Immunology

## 2023-01-29 ENCOUNTER — Other Ambulatory Visit: Payer: Self-pay | Admitting: Allergy & Immunology

## 2023-01-29 ENCOUNTER — Telehealth: Payer: Self-pay | Admitting: Nurse Practitioner

## 2023-01-29 ENCOUNTER — Other Ambulatory Visit: Payer: Self-pay | Admitting: Nurse Practitioner

## 2023-01-29 VITALS — BP 110/90 | HR 66 | Temp 98.1°F | Ht 60.0 in | Wt 218.1 lb

## 2023-01-29 DIAGNOSIS — T7800XD Anaphylactic reaction due to unspecified food, subsequent encounter: Secondary | ICD-10-CM | POA: Diagnosis not present

## 2023-01-29 DIAGNOSIS — R519 Headache, unspecified: Secondary | ICD-10-CM

## 2023-01-29 DIAGNOSIS — M542 Cervicalgia: Secondary | ICD-10-CM

## 2023-01-29 DIAGNOSIS — J4540 Moderate persistent asthma, uncomplicated: Secondary | ICD-10-CM | POA: Diagnosis not present

## 2023-01-29 DIAGNOSIS — J302 Other seasonal allergic rhinitis: Secondary | ICD-10-CM

## 2023-01-29 DIAGNOSIS — H6993 Unspecified Eustachian tube disorder, bilateral: Secondary | ICD-10-CM

## 2023-01-29 DIAGNOSIS — J3089 Other allergic rhinitis: Secondary | ICD-10-CM

## 2023-01-29 DIAGNOSIS — M549 Dorsalgia, unspecified: Secondary | ICD-10-CM

## 2023-01-29 MED ORDER — ALBUTEROL SULFATE (2.5 MG/3ML) 0.083% IN NEBU: 2.5000 mg | INHALATION_SOLUTION | Freq: Four times a day (QID) | RESPIRATORY_TRACT | 1 refills | Status: DC | PRN

## 2023-01-29 MED ORDER — BUDESONIDE-FORMOTEROL FUMARATE 160-4.5 MCG/ACT IN AERO: 2.0000 | INHALATION_SPRAY | Freq: Two times a day (BID) | RESPIRATORY_TRACT | 5 refills | Status: DC

## 2023-01-29 MED ORDER — ALBUTEROL SULFATE HFA 108 (90 BASE) MCG/ACT IN AERS: INHALATION_SPRAY | RESPIRATORY_TRACT | 1 refills | Status: DC

## 2023-01-29 MED ORDER — BUDESONIDE 0.5 MG/2ML IN SUSP: RESPIRATORY_TRACT | 2 refills | Status: DC

## 2023-01-29 MED ORDER — CETIRIZINE HCL 10 MG PO TABS: 10.0000 mg | ORAL_TABLET | Freq: Every day | ORAL | 1 refills | Status: DC
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
## 2023-01-29 NOTE — Patient Instructions (Addendum)
1. 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  - I am going to talk to Tammy to see the next steps for the injectable asthma medication.  - I think you are headed on the road to recovery.  - We are going to add on budesonide nebulizer solution to mix with you albuterol to use every 4-6 hours as you recover.  - We will send in refills for your Symbicort and albuterol as well.  - Daily controller medication(s): Symbicort 160/4.5 two puffs twice daily with spacer - Rescue medications: albuterol 4 puffs every 4-6 hours as needed or albuterol nebulizer one vial puffs every 4-6 hours as needed - Asthma control goals:  * Full participation in all desired activities (may need albuterol before activity) * Albuterol use two time or less a week on average (not counting use with activity) * Cough interfering with sleep two time or less a month * Oral steroids no more than once a year * No hospitalizations  3. Allergic rhinitis - Continue with Zyrtec (cetirizine) 10mg  daily. - Continue with Flonase one spray per nostril daily.    4. Food allergies (shellfish, wheat, and cow's milk) - EpiPen is up to date.  - Continue to avoid your triggering foods.   5. Return in about 3 months (around 04/30/2023).    Please inform us of any Emergency Department visits, hospitalizations, or changes in symptoms. Call us before going to the ED for breathing or allergy symptoms since we might be able to fit you in for a sick visit. Feel free to contact us anytime with any questions, problems, or concerns.  It was a pleasure to see you again today!  Websites that have reliable patient information: 1. American Academy of Asthma, Allergy, and Immunology: www.aaaai.org 2. Food Allergy Research and Education (FARE): foodallergy.org 3. Mothers of Asthmatics: http://www.asthmacommunitynetwork.org 4. American College of Allergy, Asthma, and Immunology:  www.acaai.org   COVID-19 Vaccine Information can be found at: PodExchange.nl For questions related to vaccine distribution or appointments, please email vaccine@[email or web address] or call [phone].     "Like" [place] on Facebook and Instagram for our latest updates!        Make sure you are registered to vote! If you have moved or changed any of your contact information, you will need to get this updated before voting!  In some cases, you MAY be able to register to vote online: [email or web address]

## 2023-01-30 NOTE — Telephone Encounter (Signed)
CLINICAL USE BELOW THIS LINE (use X to signify action taken)  ___ Form received and placed in providers office for signature. ___ Form completed and faxed to LOA Dept.  x___ Form completed & patient aware that form is ready for pick up.  ___ Charge sheet and copy of form in front office folder for office supervisor.    I will fax form to [phone]

## 2023-01-31 ENCOUNTER — Encounter: Payer: Self-pay | Admitting: Allergy & Immunology

## 2023-02-04 ENCOUNTER — Other Ambulatory Visit (HOSPITAL_COMMUNITY): Payer: Self-pay

## 2023-02-04 NOTE — Telephone Encounter (Signed)
Preferred meds are Wixela/Advair Diksus or Breo Ellipta. Please advise if patient has failed either of these meds or send in a new prescription if appropriate.

## 2023-02-05 NOTE — Telephone Encounter (Signed)
Called pt to ask if she had UC notes from 01/09/23 since that is the start date of the [facility] paperwork. Pt reports she missed work on 01/09/23, but wasn't seen until 01/10/23. She has been notified that her FMLA has been approved. Pt instructed to contact us if she needs anything on our end.

## 2023-02-11 ENCOUNTER — Other Ambulatory Visit (HOSPITAL_COMMUNITY): Payer: Self-pay
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
## 2023-02-13 ENCOUNTER — Ambulatory Visit: Payer: 59 | Attending: Nurse Practitioner

## 2023-02-13 DIAGNOSIS — M5412 Radiculopathy, cervical region: Secondary | ICD-10-CM | POA: Insufficient documentation

## 2023-02-13 DIAGNOSIS — M62830 Muscle spasm of back: Secondary | ICD-10-CM | POA: Diagnosis present

## 2023-02-13 DIAGNOSIS — M546 Pain in thoracic spine: Secondary | ICD-10-CM | POA: Diagnosis not present

## 2023-02-13 DIAGNOSIS — M542 Cervicalgia: Secondary | ICD-10-CM | POA: Diagnosis present

## 2023-02-13 DIAGNOSIS — M549 Dorsalgia, unspecified: Secondary | ICD-10-CM | POA: Diagnosis not present

## 2023-02-18 MED ORDER — FLUTICASONE FUROATE-VILANTEROL 200-25 MCG/ACT IN AEPB: 1.0000 | INHALATION_SPRAY | Freq: Every morning | RESPIRATORY_TRACT | 0 refills | Status: DC

## 2023-02-21 ENCOUNTER — Ambulatory Visit: Payer: 59 | Admitting: Physical Therapy

## 2023-02-21 ENCOUNTER — Encounter: Payer: Self-pay | Admitting: Physical Therapy

## 2023-02-21 DIAGNOSIS — M546 Pain in thoracic spine: Secondary | ICD-10-CM

## 2023-02-21 DIAGNOSIS — M542 Cervicalgia: Secondary | ICD-10-CM | POA: Diagnosis not present

## 2023-02-21 DIAGNOSIS — M5412 Radiculopathy, cervical region: Secondary | ICD-10-CM

## 2023-02-21 DIAGNOSIS — M62830 Muscle spasm of back: Secondary | ICD-10-CM
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
## 2023-02-22 ENCOUNTER — Encounter: Payer: Self-pay | Admitting: Physical Therapy

## 2023-02-22 ENCOUNTER — Ambulatory Visit: Payer: 59 | Admitting: Physical Therapy

## 2023-02-22 DIAGNOSIS — M542 Cervicalgia: Secondary | ICD-10-CM | POA: Diagnosis not present

## 2023-02-22 DIAGNOSIS — M5412 Radiculopathy, cervical region: Secondary | ICD-10-CM

## 2023-02-22 DIAGNOSIS — M546 Pain in thoracic spine: Secondary | ICD-10-CM

## 2023-02-22 DIAGNOSIS — M62830 Muscle spasm of back: Secondary | ICD-10-CM
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
## 2023-02-25 ENCOUNTER — Encounter: Payer: 59 | Admitting: Physical Therapy

## 2023-02-26 ENCOUNTER — Encounter: Payer: Self-pay | Admitting: Physical Therapy

## 2023-02-26 ENCOUNTER — Ambulatory Visit: Payer: 59 | Admitting: Physical Therapy

## 2023-02-26 DIAGNOSIS — M62830 Muscle spasm of back: Secondary | ICD-10-CM

## 2023-02-26 DIAGNOSIS — M5412 Radiculopathy, cervical region: Secondary | ICD-10-CM

## 2023-02-26 DIAGNOSIS — M542 Cervicalgia: Secondary | ICD-10-CM

## 2023-02-26 DIAGNOSIS — M546 Pain in thoracic spine: Secondary | ICD-10-CM
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
## 2023-02-28 ENCOUNTER — Ambulatory Visit: Payer: 59

## 2023-02-28 DIAGNOSIS — M542 Cervicalgia: Secondary | ICD-10-CM | POA: Diagnosis not present

## 2023-02-28 DIAGNOSIS — M5412 Radiculopathy, cervical region: Secondary | ICD-10-CM

## 2023-02-28 DIAGNOSIS — M62830 Muscle spasm of back: Secondary | ICD-10-CM

## 2023-02-28 DIAGNOSIS — M546 Pain in thoracic spine: Secondary | ICD-10-CM

## 2023-03-04 ENCOUNTER — Encounter: Payer: Self-pay | Admitting: Physical Therapy

## 2023-03-04 ENCOUNTER — Ambulatory Visit: Payer: 59 | Admitting: Physical Therapy

## 2023-03-04 DIAGNOSIS — M546 Pain in thoracic spine: Secondary | ICD-10-CM

## 2023-03-04 DIAGNOSIS — M542 Cervicalgia: Secondary | ICD-10-CM

## 2023-03-04 DIAGNOSIS — M62830 Muscle spasm of back: Secondary | ICD-10-CM

## 2023-03-04 DIAGNOSIS — M5412 Radiculopathy, cervical region: Secondary | ICD-10-CM
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
## 2023-03-06 ENCOUNTER — Ambulatory Visit: Payer: 59 | Admitting: Physical Therapy

## 2023-03-06 ENCOUNTER — Encounter: Payer: Self-pay | Admitting: Physical Therapy

## 2023-03-06 DIAGNOSIS — M62830 Muscle spasm of back: Secondary | ICD-10-CM

## 2023-03-06 DIAGNOSIS — M5412 Radiculopathy, cervical region: Secondary | ICD-10-CM

## 2023-03-06 DIAGNOSIS — M542 Cervicalgia: Secondary | ICD-10-CM

## 2023-03-06 DIAGNOSIS — M546 Pain in thoracic spine: Secondary | ICD-10-CM
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
## 2023-03-12 ENCOUNTER — Ambulatory Visit: Payer: 59 | Admitting: Physical Therapy

## 2023-03-12 ENCOUNTER — Encounter: Payer: Self-pay | Admitting: Physical Therapy

## 2023-03-12 DIAGNOSIS — M542 Cervicalgia: Secondary | ICD-10-CM

## 2023-03-12 DIAGNOSIS — M5412 Radiculopathy, cervical region: Secondary | ICD-10-CM

## 2023-03-12 DIAGNOSIS — M62830 Muscle spasm of back: Secondary | ICD-10-CM

## 2023-03-12 DIAGNOSIS — M546 Pain in thoracic spine: Secondary | ICD-10-CM
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
## 2023-03-13 ENCOUNTER — Other Ambulatory Visit (HOSPITAL_COMMUNITY)
Admission: RE | Admit: 2023-03-13 | Discharge: 2023-03-13 | Disposition: A | Discharge status: Home / Self Care | Payer: 59 | Source: Ambulatory Visit | Attending: Nurse Practitioner | Admitting: Nurse Practitioner

## 2023-03-13 ENCOUNTER — Ambulatory Visit (INDEPENDENT_AMBULATORY_CARE_PROVIDER_SITE_OTHER): Payer: 59 | Admitting: Nurse Practitioner

## 2023-03-13 ENCOUNTER — Encounter: Payer: Self-pay | Admitting: Nurse Practitioner

## 2023-03-13 DIAGNOSIS — N898 Other specified noninflammatory disorders of vagina: Secondary | ICD-10-CM | POA: Insufficient documentation

## 2023-03-13 DIAGNOSIS — M542 Cervicalgia: Secondary | ICD-10-CM | POA: Diagnosis not present

## 2023-03-13 DIAGNOSIS — N912 Amenorrhea, unspecified: Secondary | ICD-10-CM

## 2023-03-13 DIAGNOSIS — N760 Acute vaginitis: Secondary | ICD-10-CM | POA: Diagnosis not present

## 2023-03-13 DIAGNOSIS — M549 Dorsalgia, unspecified: Secondary | ICD-10-CM

## 2023-03-13 DIAGNOSIS — B9689 Other specified bacterial agents as the cause of diseases classified elsewhere: Secondary | ICD-10-CM

## 2023-03-13 LAB — POCT URINE PREGNANCY: Preg Test, Ur: NEGATIVE

## 2023-03-13 MED ORDER — CYCLOBENZAPRINE HCL 5 MG PO TABS
5.0000 mg | ORAL_TABLET | Freq: Every day | ORAL | 0 refills | Status: DC
Start: 2023-03-13 — End: 2023-04-19

## 2023-03-13 MED ORDER — MELOXICAM 7.5 MG PO TABS
7.5000 mg | ORAL_TABLET | Freq: Every day | ORAL | 2 refills | Status: DC
Start: 2023-03-13 — End: 2023-04-19
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
## 2023-03-13 NOTE — Patient Instructions (Addendum)
Go to lab [facility] to use only tylenol with meloxicam. Go to [address] for neck and upper back x-ray

## 2023-03-14 ENCOUNTER — Ambulatory Visit: Payer: 59 | Admitting: Physical Therapy

## 2023-03-14 ENCOUNTER — Encounter: Payer: Self-pay | Admitting: Nurse Practitioner

## 2023-03-14 ENCOUNTER — Encounter: Payer: Self-pay | Admitting: Physical Therapy

## 2023-03-14 DIAGNOSIS — M546 Pain in thoracic spine: Secondary | ICD-10-CM

## 2023-03-14 DIAGNOSIS — N760 Acute vaginitis: Secondary | ICD-10-CM

## 2023-03-14 DIAGNOSIS — M62830 Muscle spasm of back: Secondary | ICD-10-CM

## 2023-03-14 DIAGNOSIS — M542 Cervicalgia: Secondary | ICD-10-CM | POA: Diagnosis not present

## 2023-03-14 DIAGNOSIS — M5412 Radiculopathy, cervical region: Secondary | ICD-10-CM

## 2023-03-14 LAB — CERVICOVAGINAL ANCILLARY ONLY
Bacterial Vaginitis (gardnerella): POSITIVE — AB
Candida Glabrata: NEGATIVE
Candida Vaginitis: NEGATIVE
Chlamydia: NEGATIVE
Comment: NEGATIVE
Comment: NEGATIVE
Comment: NEGATIVE
Comment: NEGATIVE
Comment: NEGATIVE
Comment: NORMAL
Neisseria Gonorrhea: NEGATIVE
Trichomonas: NEGATIVE

## 2023-03-14 MED ORDER — CLINDAMYCIN PHOSPHATE 2 % VA CREA: 1.0000 | TOPICAL_CREAM | Freq: Every day | VAGINAL | 0 refills | Status: DC

## 2023-03-14 NOTE — Addendum Note (Signed)
Addended by: Michaela Corner on: 03/14/2023 06:46 PM   Modules accepted: Orders
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
## 2023-04-04 ENCOUNTER — Ambulatory Visit: Payer: 59

## 2023-04-04 DIAGNOSIS — M542 Cervicalgia: Secondary | ICD-10-CM

## 2023-04-04 DIAGNOSIS — M549 Dorsalgia, unspecified: Secondary | ICD-10-CM | POA: Diagnosis not present

## 2023-04-04 MED ORDER — CLINDAMYCIN HCL 300 MG PO CAPS: 300.0000 mg | ORAL_CAPSULE | Freq: Two times a day (BID) | ORAL | 0 refills | Status: AC

## 2023-04-09 ENCOUNTER — Telehealth: Payer: Self-pay | Admitting: Nurse Practitioner

## 2023-04-09 ENCOUNTER — Ambulatory Visit: Payer: 59 | Admitting: Nurse Practitioner
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
## 2023-04-16 NOTE — Telephone Encounter (Signed)
05/02/2022 no show 07/04/2022 11:00am w/Dr. Janee Morn - pt called 10:25a saying she got time confused and won't be here in time & changed to 4:20p visit w/Lauren - pt called 3:48p and say she just couldn't make it 07/05/2022 final warning letter sent via mail and mychart 04/09/2023 no show  Pt is scheduled for 12/6. Do you want to dismiss and see her 12/6 as last visit?

## 2023-04-17 NOTE — Telephone Encounter (Signed)
Dismissal generated 

## 2023-04-19 ENCOUNTER — Encounter: Payer: Self-pay | Admitting: Nurse Practitioner

## 2023-04-19 ENCOUNTER — Encounter: Payer: Self-pay | Admitting: Radiology

## 2023-04-19 ENCOUNTER — Ambulatory Visit (INDEPENDENT_AMBULATORY_CARE_PROVIDER_SITE_OTHER): Payer: 59 | Admitting: Nurse Practitioner

## 2023-04-19 DIAGNOSIS — M542 Cervicalgia: Secondary | ICD-10-CM | POA: Diagnosis not present

## 2023-04-19 DIAGNOSIS — M549 Dorsalgia, unspecified: Secondary | ICD-10-CM

## 2023-04-19 MED ORDER — CYCLOBENZAPRINE HCL 5 MG PO TABS: 5.0000 mg | ORAL_TABLET | Freq: Every day | ORAL | 5 refills | Status: DC

## 2023-04-19 MED ORDER — MELOXICAM 15 MG PO TABS: 15.0000 mg | ORAL_TABLET | Freq: Every day | ORAL | 5 refills | Status: DC

## 2023-04-19 NOTE — Patient Instructions (Signed)
Avoid all NSAIDs while taking meloxixam Increase meloxicam dose to 15mg  daily with food Continue use of flexeril Schedule appointment with massage therapist of your choice.

## 2023-04-19 NOTE — Assessment & Plan Note (Signed)
DG cervical and thoracic spine: Reversed cervical lordosis without posterior element distraction or anterior translation consistent with muscle spasm or positioning. No acute fracture, dislocation or subluxation. Prevertebral and cervicocranial soft tissues are unremarkable. No osteolytic or osteoblastic changes. There is no evidence of thoracic spine fracture. Alignment is normal. No other significant bone abnormalities are identified.  No paresthesia, no weakness, no atrophy. Some improvement in pain with meloxicam 7.5mg  and flexeril 5mg . She is requesting referral to pain specialist. Order entered. Advised to schedule appointment with massage therapist of her choice. Increase mobic doe to 15mg  daiy with food, maintain flexeril dose, and avoid OVER THE COUNTER NSAIDs while taking mobic.
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
## 2023-04-19 NOTE — Telephone Encounter (Signed)
Pt came into the office for an appointment that was scheduled for this morning at 10:00. Pt chart flagged for dismissal. Dismissal letter sent 04/17/23. Explained to pt that I was not sure how she received this appt, but she has been dismissed from the practice due to missed appointments. Asked FO to unarrive the arrival.
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
## 2023-04-23 ENCOUNTER — Ambulatory Visit: Payer: 59 | Admitting: Nurse Practitioner

## 2023-04-30 ENCOUNTER — Encounter: Payer: Self-pay | Admitting: Physical Medicine and Rehabilitation

## 2023-05-02 NOTE — Telephone Encounter (Signed)
Spoke to pt to discuss reversing dismissal but another missed visit would lead to dismissal from practice. Pt understood and appreciative. Acute visit scheduled for 12/20.

## 2023-05-03 ENCOUNTER — Other Ambulatory Visit (HOSPITAL_COMMUNITY)
Admission: RE | Admit: 2023-05-03 | Discharge: 2023-05-03 | Disposition: A | Discharge status: Home / Self Care | Payer: 59 | Source: Ambulatory Visit | Attending: Nurse Practitioner | Admitting: Nurse Practitioner

## 2023-05-03 ENCOUNTER — Encounter: Payer: Self-pay | Admitting: Nurse Practitioner

## 2023-05-03 ENCOUNTER — Ambulatory Visit: Payer: 59 | Admitting: Nurse Practitioner

## 2023-05-03 DIAGNOSIS — F332 Major depressive disorder, recurrent severe without psychotic features: Secondary | ICD-10-CM

## 2023-05-03 DIAGNOSIS — M5412 Radiculopathy, cervical region: Secondary | ICD-10-CM | POA: Diagnosis not present

## 2023-05-03 DIAGNOSIS — N898 Other specified noninflammatory disorders of vagina: Secondary | ICD-10-CM | POA: Diagnosis present

## 2023-05-03 DIAGNOSIS — G44221 Chronic tension-type headache, intractable: Secondary | ICD-10-CM

## 2023-05-03 MED ORDER — AMITRIPTYLINE HCL 10 MG PO TABS: 10.0000 mg | ORAL_TABLET | Freq: Every day | ORAL | 5 refills | Status: DC

## 2023-05-03 NOTE — Assessment & Plan Note (Signed)
Agreed to start elavil F/up in 74month

## 2023-05-03 NOTE — Patient Instructions (Addendum)
Hold flexeril while taking elavil Take elavil at bedtime. Ok to use monistat OVER THE COUNTER for vaginal itching.
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
## 2023-05-03 NOTE — Assessment & Plan Note (Signed)
First seizure at age 32, last seizure activity in high school. No previous medication Last EEG 2022: normal
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
## 2023-05-04 ENCOUNTER — Other Ambulatory Visit: Payer: 59

## 2023-05-06 ENCOUNTER — Telehealth: Payer: Self-pay

## 2023-05-06 NOTE — Telephone Encounter (Signed)
Copied from CRM [id number]. Topic: Referral - Question >> May 03, 2023  4:47 PM Corin V wrote: Reason for CRM: Felicia from [facility] called to state they do not have an authorization for the MRI C Spine without contrast. Patient was rescheduled for 12/26. Sunny Schlein can be reached at [phone] ext [phone]

## 2023-05-07 ENCOUNTER — Ambulatory Visit: Payer: 59 | Admitting: Family Medicine

## 2023-05-07 ENCOUNTER — Other Ambulatory Visit: Payer: Self-pay

## 2023-05-07 ENCOUNTER — Other Ambulatory Visit: Payer: Self-pay | Admitting: Family

## 2023-05-07 ENCOUNTER — Encounter: Payer: Self-pay | Admitting: Family Medicine

## 2023-05-07 VITALS — BP 104/70 | HR 81 | Temp 98.4°F | Resp 20 | Wt 235.4 lb

## 2023-05-07 DIAGNOSIS — J3089 Other allergic rhinitis: Secondary | ICD-10-CM | POA: Diagnosis not present

## 2023-05-07 DIAGNOSIS — T7800XD Anaphylactic reaction due to unspecified food, subsequent encounter: Secondary | ICD-10-CM | POA: Diagnosis not present

## 2023-05-07 DIAGNOSIS — L501 Idiopathic urticaria: Secondary | ICD-10-CM

## 2023-05-07 DIAGNOSIS — J302 Other seasonal allergic rhinitis: Secondary | ICD-10-CM

## 2023-05-07 DIAGNOSIS — N760 Acute vaginitis: Secondary | ICD-10-CM

## 2023-05-07 DIAGNOSIS — J4540 Moderate persistent asthma, uncomplicated: Secondary | ICD-10-CM

## 2023-05-07 LAB — CERVICOVAGINAL ANCILLARY ONLY
Bacterial Vaginitis (gardnerella): POSITIVE — AB
Candida Glabrata: NEGATIVE
Candida Vaginitis: NEGATIVE
Comment: NEGATIVE
Comment: NEGATIVE
Comment: NEGATIVE

## 2023-05-07 MED ORDER — BREZTRI AEROSPHERE 160-9-4.8 MCG/ACT IN AERO
2.0000 | INHALATION_SPRAY | Freq: Two times a day (BID) | RESPIRATORY_TRACT | 5 refills | Status: DC
Start: 2023-05-07 — End: 2023-07-18

## 2023-05-07 MED ORDER — CLINDAMYCIN PHOSPHATE 2 % VA CREA: 1.0000 | TOPICAL_CREAM | Freq: Every day | VAGINAL | 0 refills | Status: DC

## 2023-05-07 MED ORDER — LEVALBUTEROL TARTRATE 45 MCG/ACT IN AERO
4.0000 | INHALATION_SPRAY | Freq: Once | RESPIRATORY_TRACT | Status: AC
  Administered 2023-05-07: 4 via RESPIRATORY_TRACT
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
## 2023-05-07 NOTE — Patient Instructions (Addendum)
Asthma Begin Trelegy 200-1 puff once a day with a spacer to prevent cough or wheeze. Will submit for Breztri Continue albuterol 2 puffs every 4 hours as needed for cough or wheeze OR Instead use albuterol 0.083% solution via nebulizer one unit vial every 4 hours as needed for cough or wheeze You may use albuterol 2 puffs 5 to 15 minutes before activity to decrease cough or wheeze For now and for asthma flare, begin budesonide 0.5 mg twice a day via nebulizer for 1 to 2 weeks or until cough and wheeze free  Allergic rhinitis Continue allergen avoidance measures directed toward pollen, mold, cat, dog, dust mite, and cockroach as listed below Continue cetirizine 10 mg once a day as needed for runny nose or itch.  You may take an additional dose of cetirizine 10 mg once a day if needed for breakthrough symptoms Continue Flonase 2 sprays in each nostril once a day as needed for stuffy nose Consider saline nasal rinses as needed for nasal symptoms. Use this before any medicated nasal sprays for best result Consider allergen immunotherapy if your symptoms are not well-controlled with the treatment plan as listed above  Chronic urticaria If your symptoms re-occur, begin a journal of events that occurred for up to 6 hours before your symptoms began including foods and beverages consumed, soaps or perfumes you had contact with, and medications.   Food allergy Continue to avoid shellfish, wheat, and cow's milk.  In case of an allergic reaction, take Benadryl 50 mg every 4 hours, and if life-threatening symptoms occur, inject with EpiPen 0.3 mg.  Call the clinic if this treatment plan is not working well for you.  Follow up in 2 months or sooner if needed.  Reducing Pollen Exposure The American Academy of Allergy, Asthma and Immunology suggests the following steps to reduce your exposure to pollen during allergy seasons. Do not hang sheets or clothing out to dry; pollen may collect on these items. Do  not mow lawns or spend time around freshly cut grass; mowing stirs up pollen. Keep windows closed at night.  Keep car windows closed while driving. Minimize morning activities outdoors, a time when pollen counts are usually at their highest. Stay indoors as much as possible when pollen counts or humidity is high and on windy days when pollen tends to remain in the air longer. Use air conditioning when possible.  Many air conditioners have filters that trap the pollen spores. Use a HEPA room air filter to remove pollen form the indoor air you breathe.  Control of Mold Allergen Mold and fungi can grow on a variety of surfaces provided certain temperature and moisture conditions exist.  Outdoor molds grow on plants, decaying vegetation and soil.  The major outdoor mold, Alternaria and Cladosporium, are found in very high numbers during hot and dry conditions.  Generally, a late Summer - Fall peak is seen for common outdoor fungal spores.  Rain will temporarily lower outdoor mold spore count, but counts rise rapidly when the rainy period ends.  The most important indoor molds are Aspergillus and Penicillium.  Dark, humid and poorly ventilated basements are ideal sites for mold growth.  The next most common sites of mold growth are the bathroom and the kitchen.  Outdoor [employer] Use air conditioning and keep windows closed Avoid exposure to decaying vegetation. Avoid leaf raking. Avoid grain handling. Consider wearing a face mask if working in moldy areas.  Indoor Mold Control Maintain humidity below 50%. Clean washable surfaces  with 5% bleach solution. Remove sources e.g. Contaminated carpets.   Control of Dust Mite Allergen Dust mites play a major role in allergic asthma and rhinitis. They occur in environments with high humidity wherever human skin is found. Dust mites absorb humidity from the atmosphere (ie, they do not drink) and feed on organic matter (including shed human and animal  skin). Dust mites are a microscopic type of insect that you cannot see with the naked eye. High levels of dust mites have been detected from mattresses, pillows, carpets, upholstered furniture, bed covers, clothes, soft toys and any woven material. The principal allergen of the dust mite is found in its feces. A gram of dust may contain 1,000 mites and 250,000 fecal particles. Mite antigen is easily measured in the air during house cleaning activities. Dust mites do not bite and do not cause harm to humans, other than by triggering allergies/asthma.  Ways to decrease your exposure to dust mites in your home:  1. Encase mattresses, box springs and pillows with a mite-impermeable barrier or cover  2. Wash sheets, blankets and drapes weekly in hot water (130 F) with detergent and dry them in a dryer on the hot setting.  3. Have the room cleaned frequently with a vacuum cleaner and a damp dust-mop. For carpeting or rugs, vacuuming with a vacuum cleaner equipped with a high-efficiency particulate air (HEPA) filter. The dust mite allergic individual should not be in a room which is being cleaned and should wait 1 hour after cleaning before going into the room.  4. Do not sleep on upholstered furniture (eg, couches).  5. If possible removing carpeting, upholstered furniture and drapery from the home is ideal. Horizontal blinds should be eliminated in the rooms where the person spends the most time (bedroom, study, television room). Washable vinyl, roller-type shades are optimal.  6. Remove all non-washable stuffed toys from the bedroom. Wash stuffed toys weekly like sheets and blankets above.  7. Reduce indoor humidity to less than 50%. Inexpensive humidity monitors can be purchased at most hardware stores. Do not use a humidifier as can make the problem worse and are not recommended.  Control of Dog or Cat Allergen Avoidance is the best way to manage a dog or cat allergy. If you have a dog or cat and  are allergic to dog or cats, consider removing the dog or cat from the home. If you have a dog or cat but don't want to find it a new home, or if your family wants a pet even though someone in the household is allergic, here are some strategies that may help keep symptoms at bay:  Keep the pet out of your bedroom and restrict it to only a few rooms. Be advised that keeping the dog or cat in only one room will not limit the allergens to that room. Don't pet, hug or kiss the dog or cat; if you do, wash your hands with soap and water. High-efficiency particulate air (HEPA) cleaners run continuously in a bedroom or living room can reduce allergen levels over time. Regular use of a high-efficiency vacuum cleaner or a central vacuum can reduce allergen levels. Giving your dog or cat a bath at least once a week can reduce airborne allergen.  Control of Cockroach Allergen Cockroach allergen has been identified as an important cause of acute attacks of asthma, especially in urban settings.  There are fifty-five species of cockroach that exist in the [place], however only three, the [place], [place]  and [place] species produce allergen that can affect patients with Asthma.  Allergens can be obtained from fecal particles, egg casings and secretions from cockroaches.    Remove food sources. Reduce access to water. Seal access and entry points. Spray runways with 0.5-1% Diazinon or Chlorpyrifos Blow boric acid power under stoves and refrigerator. Place bait stations (hydramethylnon) at feeding sites.

## 2023-05-17 ENCOUNTER — Encounter: Payer: Self-pay | Admitting: Family

## 2023-05-18 ENCOUNTER — Other Ambulatory Visit: Payer: Self-pay | Admitting: Family

## 2023-05-18 MED ORDER — METRONIDAZOLE 500 MG PO TABS: 500.0000 mg | ORAL_TABLET | Freq: Two times a day (BID) | ORAL | 0 refills | Status: DC

## 2023-05-21 ENCOUNTER — Encounter: Payer: Self-pay | Admitting: Nurse Practitioner

## 2023-05-22 ENCOUNTER — Telehealth: Payer: Self-pay | Admitting: Nurse Practitioner

## 2023-05-22 ENCOUNTER — Encounter: Payer: Self-pay | Admitting: Nurse Practitioner

## 2023-05-22 NOTE — Telephone Encounter (Signed)
 Copied from CRM [id number]. Topic: Referral - Question >> May 22, 2023  8:33 AM Corean SAUNDERS wrote: Reason for CRM: Montie from [facility] is requesting the clinic to re fax the patients MRI referral including the information as to why she doesn't not need a prior authorization and how her employer group insurance opted out (as listed on the referral) Please fax to [phone]

## 2023-05-23 NOTE — Telephone Encounter (Signed)
Office contacted

## 2023-05-24 ENCOUNTER — Ambulatory Visit
Admission: RE | Admit: 2023-05-24 | Discharge: 2023-05-24 | Disposition: A | Discharge status: Home / Self Care | Payer: 59 | Source: Ambulatory Visit | Attending: Nurse Practitioner | Admitting: Nurse Practitioner

## 2023-05-24 DIAGNOSIS — G44221 Chronic tension-type headache, intractable: Secondary | ICD-10-CM

## 2023-05-24 DIAGNOSIS — M5412 Radiculopathy, cervical region: Secondary | ICD-10-CM

## 2023-05-29 ENCOUNTER — Encounter: Payer: 59 | Attending: Physical Medicine and Rehabilitation | Admitting: Physical Medicine and Rehabilitation

## 2023-05-29 DIAGNOSIS — R519 Headache, unspecified: Secondary | ICD-10-CM | POA: Diagnosis not present

## 2023-05-29 DIAGNOSIS — S060X0A Concussion without loss of consciousness, initial encounter: Secondary | ICD-10-CM | POA: Diagnosis not present

## 2023-05-29 DIAGNOSIS — G4709 Other insomnia: Secondary | ICD-10-CM | POA: Diagnosis present

## 2023-05-29 DIAGNOSIS — M546 Pain in thoracic spine: Secondary | ICD-10-CM | POA: Insufficient documentation

## 2023-05-29 DIAGNOSIS — R5383 Other fatigue: Secondary | ICD-10-CM

## 2023-05-29 DIAGNOSIS — S134XXS Sprain of ligaments of cervical spine, sequela: Secondary | ICD-10-CM

## 2023-05-29 DIAGNOSIS — S134XXD Sprain of ligaments of cervical spine, subsequent encounter: Secondary | ICD-10-CM | POA: Insufficient documentation

## 2023-05-29 DIAGNOSIS — S060X0D Concussion without loss of consciousness, subsequent encounter: Secondary | ICD-10-CM | POA: Diagnosis not present

## 2023-05-29 DIAGNOSIS — M542 Cervicalgia: Secondary | ICD-10-CM | POA: Insufficient documentation

## 2023-05-29 DIAGNOSIS — S134XXA Sprain of ligaments of cervical spine, initial encounter: Secondary | ICD-10-CM | POA: Insufficient documentation

## 2023-05-29 MED ORDER — TOPIRAMATE 50 MG PO TABS: 25.0000 mg | ORAL_TABLET | Freq: Two times a day (BID) | ORAL | 2 refills | Status: DC
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
## 2023-05-29 NOTE — Patient Instructions (Signed)
 Start Topamax  twice daily for headaches, you can start with one half a tab twice daily for 1 week, then if tolerating well with no side effects you can increase to 1 whole tab twice daily.  You can take up to 1000 mg of Tylenol  3 times daily for pain control.  You can also take ibuprofen  for adjunctive pain control, although we talked about not doing this every day due to risk of rebound headaches.  For localized pain control, you can use Voltaren gel over-the-counter on your neck and shoulders up to 4 times daily.  I am sending you back to physical therapy for vestibular ocular therapy.  I provided a handout today on office ergonomics to not exacerbate your head or neck pain.   For sleep difficulties, I have referred you to neurology for a home sleep evaluation.  In the meantime, use these conservative strategies:  Pick the same time to lay down every night, ideally between 8 and 10 PM.    Starting 1 hour before you want to go to sleep, turn off all television screens, phone screens, tablets, and computers.    Keep the lights low and perform only low stimulation activities, such as reading.    Only use your bedroom for sleep and sex. Avoid daytime naps and limit any time spent in your bed that is not dedicated to sleep.   You may also take 3 to 5 mg of over-the-counter melatonin approximately 1 hour before bedtime, or if you are prescribed a medication for sleep take it at this time.  Avoid alcohol, decongestants/pseudoephedrine, antihistamines, caffeine , and nicotine a few hours before bedtime, as these can reduce your quality of sleep.   Follow-up with me in 2 to 3 weeks for trigger point injections into your bilateral neck and shoulders, and for general follow-up.  You can continue seeing the chiropractor and working on stretching, massage, and range of motion.

## 2023-05-30 ENCOUNTER — Ambulatory Visit: Payer: 59 | Admitting: Physical Therapy

## 2023-05-30 DIAGNOSIS — R519 Headache, unspecified: Secondary | ICD-10-CM | POA: Insufficient documentation

## 2023-05-30 DIAGNOSIS — S060X0A Concussion without loss of consciousness, initial encounter: Secondary | ICD-10-CM | POA: Insufficient documentation

## 2023-05-30 DIAGNOSIS — M546 Pain in thoracic spine: Secondary | ICD-10-CM | POA: Insufficient documentation

## 2023-05-30 DIAGNOSIS — M62830 Muscle spasm of back: Secondary | ICD-10-CM | POA: Insufficient documentation

## 2023-05-30 DIAGNOSIS — M542 Cervicalgia: Secondary | ICD-10-CM | POA: Insufficient documentation

## 2023-05-30 DIAGNOSIS — S134XXS Sprain of ligaments of cervical spine, sequela: Secondary | ICD-10-CM | POA: Insufficient documentation

## 2023-05-30 DIAGNOSIS — M5412 Radiculopathy, cervical region: Secondary | ICD-10-CM | POA: Insufficient documentation

## 2023-05-30 NOTE — Therapy (Signed)
[facility] [address] [place], [place], [address] Phone: [phone]   Fax:  [phone]  Patient Details  Name: Danielle Velez MRN: [id number] Date of Birth: [birth year] Referring Provider:  Angelina Sheriff, DO  Encounter Date: 05/30/2023  Patient not seen for PT appointment as check in had not come fully through. Physical therapist went up to front at 14:15 to see if patient had called about missed appointment and discovered patient not fully checked in. Given time past session start, patient unable to be seen and will be rescheduled for later date.   Carmelia Bake, PT, DPT 05/30/2023, 2:25 PM  [facility] [address] [place], [place], [address] Phone: [phone]   Fax:  [phone]

## 2023-06-03 ENCOUNTER — Encounter: Payer: 59 | Admitting: Physical Medicine and Rehabilitation

## 2023-06-03 ENCOUNTER — Ambulatory Visit: Payer: 59 | Attending: Physical Medicine and Rehabilitation | Admitting: Physical Therapy

## 2023-06-03 ENCOUNTER — Other Ambulatory Visit: Payer: Self-pay

## 2023-06-03 ENCOUNTER — Encounter: Payer: Self-pay | Admitting: Physical Therapy

## 2023-06-03 DIAGNOSIS — M5412 Radiculopathy, cervical region: Secondary | ICD-10-CM

## 2023-06-03 DIAGNOSIS — M542 Cervicalgia: Secondary | ICD-10-CM | POA: Diagnosis not present

## 2023-06-03 DIAGNOSIS — R519 Headache, unspecified: Secondary | ICD-10-CM | POA: Diagnosis not present

## 2023-06-03 DIAGNOSIS — S060X0A Concussion without loss of consciousness, initial encounter: Secondary | ICD-10-CM

## 2023-06-03 DIAGNOSIS — M546 Pain in thoracic spine: Secondary | ICD-10-CM

## 2023-06-03 DIAGNOSIS — S134XXS Sprain of ligaments of cervical spine, sequela: Secondary | ICD-10-CM

## 2023-06-03 DIAGNOSIS — M62830 Muscle spasm of back: Secondary | ICD-10-CM
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
## 2023-06-07 ENCOUNTER — Encounter: Payer: Self-pay | Admitting: Nurse Practitioner

## 2023-06-07 ENCOUNTER — Ambulatory Visit (INDEPENDENT_AMBULATORY_CARE_PROVIDER_SITE_OTHER): Payer: 59 | Admitting: Nurse Practitioner

## 2023-06-07 VITALS — BP 116/80 | HR 87 | Temp 98.2°F | Ht 60.0 in | Wt 232.0 lb

## 2023-06-07 DIAGNOSIS — F332 Major depressive disorder, recurrent severe without psychotic features: Secondary | ICD-10-CM

## 2023-06-07 DIAGNOSIS — R5382 Chronic fatigue, unspecified: Secondary | ICD-10-CM | POA: Diagnosis not present

## 2023-06-07 DIAGNOSIS — Z6841 Body Mass Index (BMI) 40.0 and over, adult: Secondary | ICD-10-CM | POA: Diagnosis not present

## 2023-06-07 LAB — LIPID PANEL
Cholesterol: 125 mg/dL (ref 0–200)
HDL: 38.7 mg/dL — ABNORMAL LOW (ref >39.00)
LDL Cholesterol: 75 mg/dL (ref 0–99)
NonHDL: 85.8
Total CHOL/HDL Ratio: 3
Triglycerides: 56 mg/dL (ref 0.0–149.0)
VLDL: 11.2 mg/dL (ref 0.0–40.0)

## 2023-06-07 LAB — COMPREHENSIVE METABOLIC PANEL
ALT: 12 U/L (ref 0–35)
AST: 17 U/L (ref 0–37)
Albumin: 4.2 g/dL (ref 3.5–5.2)
Alkaline Phosphatase: 70 U/L (ref 39–117)
BUN: 11 mg/dL (ref 6–23)
CO2: 23 meq/L (ref 19–32)
Calcium: 9.2 mg/dL (ref 8.4–10.5)
Chloride: 108 meq/L (ref 96–112)
Creatinine, Ser: 0.94 mg/dL (ref 0.40–1.20)
GFR: 80.6 mL/min (ref >60.00)
Glucose, Bld: 95 mg/dL (ref 70–99)
Potassium: 3.8 meq/L (ref 3.5–5.1)
Sodium: 140 meq/L (ref 135–145)
Total Bilirubin: 0.3 mg/dL (ref 0.2–1.2)
Total Protein: 7.6 g/dL (ref 6.0–8.3)

## 2023-06-07 LAB — CBC
HCT: 41.4 % (ref 36.0–46.0)
Hemoglobin: 13.5 g/dL (ref 12.0–15.0)
MCHC: 32.5 g/dL (ref 30.0–36.0)
MCV: 83.4 fL (ref 78.0–100.0)
Platelets: 301 10*3/uL (ref 150.0–400.0)
RBC: 4.96 Mil/uL (ref 3.87–5.11)
RDW: 14 % (ref 11.5–15.5)
WBC: 7 10*3/uL (ref 4.0–10.5)

## 2023-06-07 LAB — TSH: TSH: 1.94 u[IU]/mL (ref 0.35–5.50)

## 2023-06-07 LAB — HEMOGLOBIN A1C: Hgb A1c MFr Bld: 6.1 % (ref 4.6–6.5)
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
## 2023-06-07 NOTE — Assessment & Plan Note (Addendum)
Unable to tolerate elavil- fogginess and labile emotions Previous sessions with psychology-discontinued due to cost. Reports stable mood with topamax. No SI/HI/hallucination She agreed to new psychology referral.

## 2023-06-07 NOTE — Assessment & Plan Note (Signed)
Pending appointment with sleep clinic Check THYROID, CBC, vit. D
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
## 2023-06-07 NOTE — Patient Instructions (Signed)
Go to lab Maintain Heart healthy diet and daily exercise. Maintain current medications. Continue f/up with PT and Dr. Shearon Stalls

## 2023-06-07 NOTE — Assessment & Plan Note (Signed)
Reports improved pain and headache with topamax Now under the care of Dr. Shearon Stalls with Pain clinic. She was referred to PT as well Advised to continue care under Dr. Shearon Stalls.

## 2023-06-08 LAB — IRON,TIBC AND FERRITIN PANEL
%SAT: 24 % (ref 16–45)
Ferritin: 34 ng/mL (ref 16–154)
Iron: 79 ug/dL (ref 40–190)
TIBC: 323 ug/dL (ref 250–450)

## 2023-06-10 ENCOUNTER — Ambulatory Visit: Payer: 59 | Admitting: Physical Therapy

## 2023-06-10 DIAGNOSIS — S134XXS Sprain of ligaments of cervical spine, sequela: Secondary | ICD-10-CM | POA: Diagnosis not present

## 2023-06-10 DIAGNOSIS — M542 Cervicalgia: Secondary | ICD-10-CM

## 2023-06-10 DIAGNOSIS — M5412 Radiculopathy, cervical region: Secondary | ICD-10-CM

## 2023-06-10 DIAGNOSIS — S060X0A Concussion without loss of consciousness, initial encounter: Secondary | ICD-10-CM

## 2023-06-10 DIAGNOSIS — M62830 Muscle spasm of back: Secondary | ICD-10-CM

## 2023-06-10 DIAGNOSIS — M546 Pain in thoracic spine: Secondary | ICD-10-CM
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
## 2023-06-12 ENCOUNTER — Ambulatory Visit: Payer: 59 | Admitting: Physical Therapy

## 2023-06-12 DIAGNOSIS — S134XXS Sprain of ligaments of cervical spine, sequela: Secondary | ICD-10-CM

## 2023-06-12 DIAGNOSIS — S060X0A Concussion without loss of consciousness, initial encounter: Secondary | ICD-10-CM

## 2023-06-12 DIAGNOSIS — M542 Cervicalgia: Secondary | ICD-10-CM

## 2023-06-12 DIAGNOSIS — M62830 Muscle spasm of back: Secondary | ICD-10-CM

## 2023-06-12 DIAGNOSIS — M546 Pain in thoracic spine: Secondary | ICD-10-CM

## 2023-06-12 DIAGNOSIS — R519 Headache, unspecified: Secondary | ICD-10-CM

## 2023-06-12 DIAGNOSIS — M5412 Radiculopathy, cervical region: Secondary | ICD-10-CM
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
## 2023-06-17 ENCOUNTER — Encounter: Payer: 59 | Attending: Physical Medicine and Rehabilitation | Admitting: Physical Medicine and Rehabilitation

## 2023-06-17 VITALS — BP 129/84 | HR 81 | Ht 60.0 in | Wt 232.0 lb

## 2023-06-17 DIAGNOSIS — M7918 Myalgia, other site: Secondary | ICD-10-CM | POA: Diagnosis present

## 2023-06-17 MED ORDER — LIDOCAINE HCL 1 % IJ SOLN: 6.0000 mL | Freq: Once | INTRAMUSCULAR | Status: DC

## 2023-06-17 MED ORDER — TRIAMCINOLONE ACETONIDE 40 MG/ML IJ SUSP: 5.0000 mg | Freq: Once | INTRAMUSCULAR | Status: DC

## 2023-06-17 NOTE — Patient Instructions (Signed)
-   Resume Usual Activities. Notify Physician of any unusual bleeding, erythema or concern for side effects as reviewed above. - Apply ice prn for pain - Tylenol prn for pain - Follow up in  4 weeks to assess response to injection
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
## 2023-06-20 ENCOUNTER — Ambulatory Visit: Payer: 59 | Attending: Physical Medicine and Rehabilitation | Admitting: Physical Therapy

## 2023-06-20 DIAGNOSIS — M542 Cervicalgia: Secondary | ICD-10-CM | POA: Insufficient documentation

## 2023-06-20 DIAGNOSIS — M62830 Muscle spasm of back: Secondary | ICD-10-CM | POA: Insufficient documentation

## 2023-06-20 DIAGNOSIS — S060X0D Concussion without loss of consciousness, subsequent encounter: Secondary | ICD-10-CM | POA: Diagnosis not present

## 2023-06-20 DIAGNOSIS — M5412 Radiculopathy, cervical region: Secondary | ICD-10-CM | POA: Insufficient documentation

## 2023-06-20 DIAGNOSIS — S134XXD Sprain of ligaments of cervical spine, subsequent encounter: Secondary | ICD-10-CM | POA: Diagnosis not present

## 2023-06-20 DIAGNOSIS — R519 Headache, unspecified: Secondary | ICD-10-CM | POA: Diagnosis not present

## 2023-06-20 DIAGNOSIS — M546 Pain in thoracic spine: Secondary | ICD-10-CM | POA: Diagnosis present
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
## 2023-06-24 ENCOUNTER — Ambulatory Visit: Payer: 59 | Admitting: Physical Therapy

## 2023-06-24 DIAGNOSIS — R519 Headache, unspecified: Secondary | ICD-10-CM

## 2023-06-24 DIAGNOSIS — S060X0A Concussion without loss of consciousness, initial encounter: Secondary | ICD-10-CM

## 2023-06-24 DIAGNOSIS — M542 Cervicalgia: Secondary | ICD-10-CM | POA: Diagnosis not present

## 2023-06-24 DIAGNOSIS — M546 Pain in thoracic spine: Secondary | ICD-10-CM

## 2023-06-24 DIAGNOSIS — M5412 Radiculopathy, cervical region: Secondary | ICD-10-CM

## 2023-06-24 DIAGNOSIS — M62830 Muscle spasm of back: Secondary | ICD-10-CM

## 2023-06-24 DIAGNOSIS — S134XXS Sprain of ligaments of cervical spine, sequela: Secondary | ICD-10-CM
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
## 2023-06-26 ENCOUNTER — Ambulatory Visit: Payer: 59 | Admitting: Physical Therapy

## 2023-06-26 DIAGNOSIS — M542 Cervicalgia: Secondary | ICD-10-CM | POA: Diagnosis not present

## 2023-06-26 DIAGNOSIS — M546 Pain in thoracic spine: Secondary | ICD-10-CM

## 2023-06-26 DIAGNOSIS — M5412 Radiculopathy, cervical region: Secondary | ICD-10-CM
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
## 2023-06-27 ENCOUNTER — Encounter: Payer: Self-pay | Admitting: Neurology

## 2023-06-27 ENCOUNTER — Encounter: Payer: Self-pay | Admitting: Physical Medicine and Rehabilitation

## 2023-06-27 ENCOUNTER — Ambulatory Visit: Payer: 59 | Admitting: Neurology

## 2023-06-27 VITALS — BP 104/70 | HR 73 | Ht 65.0 in | Wt 234.0 lb

## 2023-06-27 DIAGNOSIS — E669 Obesity, unspecified: Secondary | ICD-10-CM | POA: Diagnosis not present

## 2023-06-27 DIAGNOSIS — G4719 Other hypersomnia: Secondary | ICD-10-CM | POA: Diagnosis not present

## 2023-06-27 DIAGNOSIS — Z9189 Other specified personal risk factors, not elsewhere classified: Secondary | ICD-10-CM | POA: Diagnosis not present

## 2023-06-27 DIAGNOSIS — R519 Headache, unspecified: Secondary | ICD-10-CM

## 2023-06-27 DIAGNOSIS — R0683 Snoring: Secondary | ICD-10-CM | POA: Diagnosis not present

## 2023-06-27 DIAGNOSIS — R351 Nocturia: Secondary | ICD-10-CM

## 2023-06-27 NOTE — Patient Instructions (Signed)
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
## 2023-06-28 NOTE — Telephone Encounter (Signed)
Please add TPI to visit 07/15/22; if I can extend it to a 40 minute slot, that would be great, but if not I can make a 20 minute slot work. Thank you!

## 2023-07-01 ENCOUNTER — Ambulatory Visit: Payer: 59 | Admitting: Physical Therapy

## 2023-07-01 DIAGNOSIS — M546 Pain in thoracic spine: Secondary | ICD-10-CM

## 2023-07-01 DIAGNOSIS — M542 Cervicalgia: Secondary | ICD-10-CM

## 2023-07-01 DIAGNOSIS — M5412 Radiculopathy, cervical region: Secondary | ICD-10-CM
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
## 2023-07-02 ENCOUNTER — Encounter (INDEPENDENT_AMBULATORY_CARE_PROVIDER_SITE_OTHER): Payer: Self-pay | Admitting: Physician Assistant

## 2023-07-02 ENCOUNTER — Ambulatory Visit (INDEPENDENT_AMBULATORY_CARE_PROVIDER_SITE_OTHER): Payer: 59 | Admitting: Physician Assistant

## 2023-07-02 VITALS — BP 108/69 | HR 92 | Temp 97.6°F | Ht 60.5 in | Wt 230.0 lb

## 2023-07-02 DIAGNOSIS — R5382 Chronic fatigue, unspecified: Secondary | ICD-10-CM

## 2023-07-02 DIAGNOSIS — M5412 Radiculopathy, cervical region: Secondary | ICD-10-CM | POA: Diagnosis not present

## 2023-07-02 DIAGNOSIS — Z6841 Body Mass Index (BMI) 40.0 and over, adult: Secondary | ICD-10-CM | POA: Diagnosis not present

## 2023-07-02 DIAGNOSIS — E66813 Obesity, class 3: Secondary | ICD-10-CM

## 2023-07-02 DIAGNOSIS — Z0289 Encounter for other administrative examinations: Secondary | ICD-10-CM
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
## 2023-07-03 ENCOUNTER — Ambulatory Visit: Payer: 59 | Admitting: Physical Therapy

## 2023-07-05 ENCOUNTER — Ambulatory Visit: Payer: 59 | Admitting: Physical Therapy

## 2023-07-05 DIAGNOSIS — M5412 Radiculopathy, cervical region: Secondary | ICD-10-CM

## 2023-07-05 DIAGNOSIS — M62830 Muscle spasm of back: Secondary | ICD-10-CM

## 2023-07-05 DIAGNOSIS — M542 Cervicalgia: Secondary | ICD-10-CM

## 2023-07-05 DIAGNOSIS — M546 Pain in thoracic spine: Secondary | ICD-10-CM
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
## 2023-07-08 ENCOUNTER — Ambulatory Visit: Payer: 59 | Admitting: Physical Therapy

## 2023-07-08 ENCOUNTER — Ambulatory Visit: Payer: 59 | Admitting: Family Medicine

## 2023-07-08 ENCOUNTER — Telehealth: Payer: Self-pay | Admitting: Neurology

## 2023-07-08 DIAGNOSIS — M542 Cervicalgia: Secondary | ICD-10-CM | POA: Diagnosis not present

## 2023-07-08 DIAGNOSIS — M62830 Muscle spasm of back: Secondary | ICD-10-CM

## 2023-07-08 DIAGNOSIS — M546 Pain in thoracic spine: Secondary | ICD-10-CM

## 2023-07-08 DIAGNOSIS — M5412 Radiculopathy, cervical region: Secondary | ICD-10-CM
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
## 2023-07-08 NOTE — Telephone Encounter (Signed)
 NPSG [employer] no Gerald Leitz ref # [id number]   Patient is scheduled at [facility] for 08/04/23 at 9 pm.  Mailed packet to the patient.

## 2023-07-10 ENCOUNTER — Ambulatory Visit: Payer: 59 | Admitting: Physical Therapy

## 2023-07-10 DIAGNOSIS — M62830 Muscle spasm of back: Secondary | ICD-10-CM

## 2023-07-10 DIAGNOSIS — M5412 Radiculopathy, cervical region: Secondary | ICD-10-CM

## 2023-07-10 DIAGNOSIS — M542 Cervicalgia: Secondary | ICD-10-CM

## 2023-07-10 DIAGNOSIS — M546 Pain in thoracic spine: Secondary | ICD-10-CM
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
## 2023-07-15 ENCOUNTER — Encounter: Payer: Self-pay | Admitting: Physical Medicine and Rehabilitation

## 2023-07-15 ENCOUNTER — Encounter: Payer: 59 | Attending: Physical Medicine and Rehabilitation | Admitting: Physical Medicine and Rehabilitation

## 2023-07-15 VITALS — BP 130/81 | HR 95 | Wt 239.0 lb

## 2023-07-15 DIAGNOSIS — F332 Major depressive disorder, recurrent severe without psychotic features: Secondary | ICD-10-CM | POA: Diagnosis not present

## 2023-07-15 DIAGNOSIS — S060X0A Concussion without loss of consciousness, initial encounter: Secondary | ICD-10-CM | POA: Diagnosis not present

## 2023-07-15 DIAGNOSIS — M549 Dorsalgia, unspecified: Secondary | ICD-10-CM

## 2023-07-15 DIAGNOSIS — S060X0S Concussion without loss of consciousness, sequela: Secondary | ICD-10-CM | POA: Diagnosis present

## 2023-07-15 DIAGNOSIS — M7918 Myalgia, other site: Secondary | ICD-10-CM | POA: Diagnosis present

## 2023-07-15 DIAGNOSIS — R519 Headache, unspecified: Secondary | ICD-10-CM | POA: Diagnosis not present

## 2023-07-15 DIAGNOSIS — R2689 Other abnormalities of gait and mobility: Secondary | ICD-10-CM | POA: Insufficient documentation

## 2023-07-15 DIAGNOSIS — D573 Sickle-cell trait: Secondary | ICD-10-CM | POA: Insufficient documentation

## 2023-07-15 DIAGNOSIS — G629 Polyneuropathy, unspecified: Secondary | ICD-10-CM | POA: Insufficient documentation

## 2023-07-15 DIAGNOSIS — M542 Cervicalgia: Secondary | ICD-10-CM

## 2023-07-15 DIAGNOSIS — J4540 Moderate persistent asthma, uncomplicated: Secondary | ICD-10-CM | POA: Insufficient documentation

## 2023-07-15 DIAGNOSIS — S134XXS Sprain of ligaments of cervical spine, sequela: Secondary | ICD-10-CM

## 2023-07-15 DIAGNOSIS — G4709 Other insomnia: Secondary | ICD-10-CM

## 2023-07-15 MED ORDER — TOPIRAMATE 50 MG PO TABS: 25.0000 mg | ORAL_TABLET | Freq: Every day | ORAL | 2 refills | Status: AC | PRN

## 2023-07-15 MED ORDER — TRIAMCINOLONE ACETONIDE 40 MG/ML IJ SUSP
5.0000 mg | Freq: Once | INTRAMUSCULAR | Status: AC
Start: 2023-07-15 — End: 2023-07-15
  Administered 2023-07-15: 5.2 mg via INTRAMUSCULAR

## 2023-07-15 MED ORDER — CYCLOBENZAPRINE HCL 5 MG PO TABS: 5.0000 mg | ORAL_TABLET | Freq: Three times a day (TID) | ORAL | 5 refills | Status: AC | PRN

## 2023-07-15 MED ORDER — PREGABALIN 25 MG PO CAPS: 25.0000 mg | ORAL_CAPSULE | Freq: Two times a day (BID) | ORAL | 1 refills | Status: DC

## 2023-07-15 MED ORDER — LIDOCAINE 5 % EX PTCH: 2.0000 | MEDICATED_PATCH | CUTANEOUS | 3 refills | Status: DC

## 2023-07-15 MED ORDER — LIDOCAINE HCL 1 % IJ SOLN
6.0000 mL | Freq: Once | INTRAMUSCULAR | Status: AC
  Administered 2023-07-15: 6 mL
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
## 2023-07-15 NOTE — Patient Instructions (Incomplete)
 Asthma Begin Trelegy 200-1 puff once a day with a spacer to prevent cough or wheeze. Will submit for Breztri Continue albuterol 2 puffs every 4 hours as needed for cough or wheeze OR Instead use albuterol 0.083% solution via nebulizer one unit vial every 4 hours as needed for cough or wheeze You may use albuterol 2 puffs 5 to 15 minutes before activity to decrease cough or wheeze For now and for asthma flare, begin budesonide 0.5 mg twice a day via nebulizer for 1 to 2 weeks or until cough and wheeze free  Allergic rhinitis Continue allergen avoidance measures directed toward pollen, mold, cat, dog, dust mite, and cockroach as listed below Continue cetirizine 10 mg once a day as needed for runny nose or itch.  You may take an additional dose of cetirizine 10 mg once a day if needed for breakthrough symptoms Continue Flonase 2 sprays in each nostril once a day as needed for stuffy nose Consider saline nasal rinses as needed for nasal symptoms. Use this before any medicated nasal sprays for best result Consider updating your environmental allergy testing. Remember to stop antihistamines for 3 days before your allergy skin testing appointment Consider allergen immunotherapy if your symptoms are not well-controlled with the treatment plan as listed above   Chronic urticaria If your symptoms re-occur, begin a journal of events that occurred for up to 6 hours before your symptoms began including foods and beverages consumed, soaps or perfumes you had contact with, and medications.   Food allergy Continue to avoid shellfish, wheat, and cow's milk.  In case of an allergic reaction, take Benadryl 50 mg every 4 hours, and if life-threatening symptoms occur, inject with EpiPen 0.3 mg. Consider updating your food allergy testing. Remember to stop antihistamines for 3 days before your allergy skin testing appointment  Call the clinic if this treatment plan is not working well for you.  Follow up in 2  months or sooner if needed.  Reducing Pollen Exposure The American Academy of Allergy, Asthma and Immunology suggests the following steps to reduce your exposure to pollen during allergy seasons. Do not hang sheets or clothing out to dry; pollen may collect on these items. Do not mow lawns or spend time around freshly cut grass; mowing stirs up pollen. Keep windows closed at night.  Keep car windows closed while driving. Minimize morning activities outdoors, a time when pollen counts are usually at their highest. Stay indoors as much as possible when pollen counts or humidity is high and on windy days when pollen tends to remain in the air longer. Use air conditioning when possible.  Many air conditioners have filters that trap the pollen spores. Use a HEPA room air filter to remove pollen form the indoor air you breathe.  Control of Mold Allergen Mold and fungi can grow on a variety of surfaces provided certain temperature and moisture conditions exist.  Outdoor molds grow on plants, decaying vegetation and soil.  The major outdoor mold, Alternaria and Cladosporium, are found in very high numbers during hot and dry conditions.  Generally, a late Summer - Fall peak is seen for common outdoor fungal spores.  Rain will temporarily lower outdoor mold spore count, but counts rise rapidly when the rainy period ends.  The most important indoor molds are Aspergillus and Penicillium.  Dark, humid and poorly ventilated basements are ideal sites for mold growth.  The next most common sites of mold growth are the bathroom and the kitchen.  Outdoor [employer]  Use air conditioning and keep windows closed Avoid exposure to decaying vegetation. Avoid leaf raking. Avoid grain handling. Consider wearing a face mask if working in moldy areas.  Indoor Mold Control Maintain humidity below 50%. Clean washable surfaces with 5% bleach solution. Remove sources e.g. Contaminated carpets.   Control of Dust Mite  Allergen Dust mites play a major role in allergic asthma and rhinitis. They occur in environments with high humidity wherever human skin is found. Dust mites absorb humidity from the atmosphere (ie, they do not drink) and feed on organic matter (including shed human and animal skin). Dust mites are a microscopic type of insect that you cannot see with the naked eye. High levels of dust mites have been detected from mattresses, pillows, carpets, upholstered furniture, bed covers, clothes, soft toys and any woven material. The principal allergen of the dust mite is found in its feces. A gram of dust may contain 1,000 mites and 250,000 fecal particles. Mite antigen is easily measured in the air during house cleaning activities. Dust mites do not bite and do not cause harm to humans, other than by triggering allergies/asthma.  Ways to decrease your exposure to dust mites in your home:  1. Encase mattresses, box springs and pillows with a mite-impermeable barrier or cover  2. Wash sheets, blankets and drapes weekly in hot water (130 F) with detergent and dry them in a dryer on the hot setting.  3. Have the room cleaned frequently with a vacuum cleaner and a damp dust-mop. For carpeting or rugs, vacuuming with a vacuum cleaner equipped with a high-efficiency particulate air (HEPA) filter. The dust mite allergic individual should not be in a room which is being cleaned and should wait 1 hour after cleaning before going into the room.  4. Do not sleep on upholstered furniture (eg, couches).  5. If possible removing carpeting, upholstered furniture and drapery from the home is ideal. Horizontal blinds should be eliminated in the rooms where the person spends the most time (bedroom, study, television room). Washable vinyl, roller-type shades are optimal.  6. Remove all non-washable stuffed toys from the bedroom. Wash stuffed toys weekly like sheets and blankets above.  7. Reduce indoor humidity to less than  50%. Inexpensive humidity monitors can be purchased at most hardware stores. Do not use a humidifier as can make the problem worse and are not recommended.  Control of Dog or Cat Allergen Avoidance is the best way to manage a dog or cat allergy. If you have a dog or cat and are allergic to dog or cats, consider removing the dog or cat from the home. If you have a dog or cat but don't want to find it a new home, or if your family wants a pet even though someone in the household is allergic, here are some strategies that may help keep symptoms at bay:  Keep the pet out of your bedroom and restrict it to only a few rooms. Be advised that keeping the dog or cat in only one room will not limit the allergens to that room. Don't pet, hug or kiss the dog or cat; if you do, wash your hands with soap and water. High-efficiency particulate air (HEPA) cleaners run continuously in a bedroom or living room can reduce allergen levels over time. Regular use of a high-efficiency vacuum cleaner or a central vacuum can reduce allergen levels. Giving your dog or cat a bath at least once a week can reduce airborne allergen.  Control of  Cockroach Allergen Cockroach allergen has been identified as an important cause of acute attacks of asthma, especially in urban settings.  There are fifty-five species of cockroach that exist in the [place], however only three, the [place], [place] species produce allergen that can affect patients with Asthma.  Allergens can be obtained from fecal particles, egg casings and secretions from cockroaches.    Remove food sources. Reduce access to water. Seal access and entry points. Spray runways with 0.5-1% Diazinon or Chlorpyrifos Blow boric acid power under stoves and refrigerator. Place bait stations (hydramethylnon) at feeding sites.

## 2023-07-15 NOTE — Patient Instructions (Signed)
 Start lyrica 1 capsule at night for 1 week, then increase to 1 capsule twice daily and after 2 week message me for dose adjustments  Stop meloxicam  Use topomax as needed for headaches with tylenol  Increase flexeril to 3 times daily as needed  Use lidocaine patches on both shoulders  Continue home exercises and stretches  Agree with obtaining TENs unit  Follow up in 3 months
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
## 2023-07-18 ENCOUNTER — Encounter: Payer: Self-pay | Admitting: Family Medicine

## 2023-07-18 ENCOUNTER — Other Ambulatory Visit: Payer: Self-pay

## 2023-07-18 ENCOUNTER — Ambulatory Visit: Payer: 59 | Admitting: Family Medicine

## 2023-07-18 VITALS — BP 122/80 | HR 96 | Temp 98.5°F

## 2023-07-18 DIAGNOSIS — J3089 Other allergic rhinitis: Secondary | ICD-10-CM | POA: Diagnosis not present

## 2023-07-18 DIAGNOSIS — K219 Gastro-esophageal reflux disease without esophagitis: Secondary | ICD-10-CM

## 2023-07-18 DIAGNOSIS — J4540 Moderate persistent asthma, uncomplicated: Secondary | ICD-10-CM

## 2023-07-18 DIAGNOSIS — T7800XD Anaphylactic reaction due to unspecified food, subsequent encounter: Secondary | ICD-10-CM

## 2023-07-18 DIAGNOSIS — L501 Idiopathic urticaria: Secondary | ICD-10-CM

## 2023-07-18 DIAGNOSIS — R079 Chest pain, unspecified: Secondary | ICD-10-CM

## 2023-07-18 DIAGNOSIS — J302 Other seasonal allergic rhinitis: Secondary | ICD-10-CM

## 2023-07-18 MED ORDER — ALBUTEROL SULFATE (2.5 MG/3ML) 0.083% IN NEBU: 2.5000 mg | INHALATION_SOLUTION | Freq: Four times a day (QID) | RESPIRATORY_TRACT | 1 refills | Status: AC | PRN

## 2023-07-18 MED ORDER — ALBUTEROL SULFATE HFA 108 (90 BASE) MCG/ACT IN AERS: INHALATION_SPRAY | RESPIRATORY_TRACT | 1 refills | Status: DC

## 2023-07-18 MED ORDER — EPINEPHRINE 0.3 MG/0.3ML IJ SOAJ: 0.3000 mg | INTRAMUSCULAR | 1 refills | Status: AC | PRN

## 2023-07-18 MED ORDER — BUDESONIDE-FORMOTEROL FUMARATE 160-4.5 MCG/ACT IN AERO: 2.0000 | INHALATION_SPRAY | Freq: Two times a day (BID) | RESPIRATORY_TRACT | 5 refills | Status: DC

## 2023-07-31 ENCOUNTER — Ambulatory Visit (INDEPENDENT_AMBULATORY_CARE_PROVIDER_SITE_OTHER): Payer: 59 | Admitting: Family Medicine

## 2023-08-09 ENCOUNTER — Other Ambulatory Visit: Payer: Self-pay | Admitting: Nurse Practitioner

## 2023-08-09 DIAGNOSIS — Z8619 Personal history of other infectious and parasitic diseases: Secondary | ICD-10-CM

## 2023-08-09 NOTE — Telephone Encounter (Signed)
 Requesting: valACYclovir (VALTREX) 1000 MG tablet  Last Visit: 06/07/2023 Next Visit: 12/05/2023 Last Refill: 04/17/2021 by Historical Provider  Please Advise

## 2023-08-12 ENCOUNTER — Other Ambulatory Visit (HOSPITAL_COMMUNITY): Payer: Self-pay

## 2023-08-12 MED ORDER — VALACYCLOVIR HCL 1 G PO TABS
2000.0000 mg | ORAL_TABLET | Freq: Two times a day (BID) | ORAL | 0 refills | Status: AC
  Filled 2023-08-12: qty 4, 1d supply, fill #0

## 2023-08-14 ENCOUNTER — Ambulatory Visit (INDEPENDENT_AMBULATORY_CARE_PROVIDER_SITE_OTHER): Payer: 59 | Admitting: Family Medicine

## 2023-08-15 ENCOUNTER — Ambulatory Visit (INDEPENDENT_AMBULATORY_CARE_PROVIDER_SITE_OTHER): Admitting: Family Medicine

## 2023-08-23 ENCOUNTER — Other Ambulatory Visit (HOSPITAL_COMMUNITY): Payer: Self-pay

## 2023-09-11 ENCOUNTER — Other Ambulatory Visit: Payer: Self-pay | Admitting: Family Medicine
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
## 2023-10-16 ENCOUNTER — Encounter: Admitting: Physical Medicine and Rehabilitation

## 2023-10-21 ENCOUNTER — Encounter: Attending: Physical Medicine and Rehabilitation | Admitting: Physical Medicine and Rehabilitation

## 2023-10-21 ENCOUNTER — Encounter: Payer: Self-pay | Admitting: Physical Medicine and Rehabilitation

## 2023-10-21 DIAGNOSIS — S134XXS Sprain of ligaments of cervical spine, sequela: Secondary | ICD-10-CM | POA: Insufficient documentation

## 2023-10-21 DIAGNOSIS — M542 Cervicalgia: Secondary | ICD-10-CM | POA: Insufficient documentation

## 2023-10-21 DIAGNOSIS — R5383 Other fatigue: Secondary | ICD-10-CM | POA: Insufficient documentation

## 2023-10-21 DIAGNOSIS — M7918 Myalgia, other site: Secondary | ICD-10-CM | POA: Diagnosis not present

## 2023-10-21 MED ORDER — LIDOCAINE 5 % EX PTCH: 2.0000 | MEDICATED_PATCH | CUTANEOUS | 3 refills | Status: DC

## 2023-10-21 MED ORDER — PREGABALIN 25 MG PO CAPS: 25.0000 mg | ORAL_CAPSULE | Freq: Two times a day (BID) | ORAL | 3 refills | Status: AC

## 2023-10-21 NOTE — Patient Instructions (Addendum)
 I have refilled Lyrica  and lidocaine  patches today; continue these, topomax and flexeril  as needed  Try salonpas patches on your back - I agree with getting bras fot or going without them if it is a source of discomfort.   Try to increase cardiovascular exercise to 60 minutes a week total; final goal would be 120 minutes a week.  Continue lifting  Get your sleep study  When getting a new desk, look for either a desk that can transition to standing or put books/a stool under your laptop to change positions every few hours  Follow up as needed for trigger points and in 6 months for general follow up
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
## 2023-10-22 ENCOUNTER — Ambulatory Visit (INDEPENDENT_AMBULATORY_CARE_PROVIDER_SITE_OTHER): Admitting: Allergy & Immunology

## 2023-10-22 ENCOUNTER — Other Ambulatory Visit: Payer: Self-pay

## 2023-10-22 VITALS — BP 120/82 | HR 78 | Temp 98.4°F | Resp 18 | Ht 61.02 in | Wt 243.8 lb

## 2023-10-22 DIAGNOSIS — J4540 Moderate persistent asthma, uncomplicated: Secondary | ICD-10-CM | POA: Diagnosis not present

## 2023-10-22 DIAGNOSIS — K219 Gastro-esophageal reflux disease without esophagitis: Secondary | ICD-10-CM

## 2023-10-22 DIAGNOSIS — T7800XD Anaphylactic reaction due to unspecified food, subsequent encounter: Secondary | ICD-10-CM

## 2023-10-22 MED ORDER — BUDESONIDE-FORMOTEROL FUMARATE 160-4.5 MCG/ACT IN AERO: 2.0000 | INHALATION_SPRAY | Freq: Two times a day (BID) | RESPIRATORY_TRACT | 5 refills | Status: DC

## 2023-10-22 NOTE — Patient Instructions (Addendum)
 1. 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  - We are getting repeat testing since we need this for biologic approval. - In the past, Nucala has been the one that is on formulary.  - Daily controller medication(s): Symbicort  160/4.5 two puffs twice daily with spacer - Rescue medications: albuterol  4 puffs every 4-6 hours as needed or albuterol  nebulizer one vial puffs every 4-6 hours as needed - Asthma control goals:  * Full participation in all desired activities (may need albuterol  before activity) * Albuterol  use two time or less a week on average (not counting use with activity) * Cough interfering with sleep two time or less a month * Oral steroids no more than once a year * No hospitalizations  2. Allergic rhinitis - Continue with Zyrtec  (cetirizine ) 10mg  daily. - Continue with Flonase  one spray per nostril daily.    3. Food allergies (shellfish and cow's milk) - EpiPen  is up to date.  - Continue to avoid your triggering foods.  - Repeat testing ordered for shellfish today. - Continue to avoid milkas you are doing since you seem to have a good handle on these symptoms.   4. Return in about 3 months (around 01/22/2024).    Please inform us  of any Emergency Department visits, hospitalizations, or changes in symptoms. Call us  before going to the ED for breathing or allergy symptoms since we might be able to fit you in for a sick visit. Feel free to contact us  anytime with any questions, problems, or concerns.  It was a pleasure to see you again today!  Websites that have reliable patient information: 1. American Academy of Asthma, Allergy, and Immunology: www.aaaai.org 2. Food Allergy Research and Education (FARE): foodallergy.org 3. Mothers of Asthmatics: http://www.asthmacommunitynetwork.org 4. American College of Allergy, Asthma, and Immunology: www.acaai.org   COVID-19 Vaccine Information can be found at:  [email or web address] For questions related to vaccine distribution or appointments, please email [email or web address] or call [phone].     "Like" us  on Facebook and Instagram for our latest updates!        Make sure you are registered to vote! If you have moved or changed any of your contact information, you will need to get this updated before voting!  In some cases, you MAY be able to register to vote online: [email or web address]
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
## 2023-10-23 ENCOUNTER — Encounter: Payer: Self-pay | Admitting: Allergy & Immunology

## 2023-10-23 LAB — CBC WITH DIFFERENTIAL/PLATELET
Basophils Absolute: 0 10*3/uL (ref 0.0–0.2)
Basos: 0 %
EOS (ABSOLUTE): 0.3 10*3/uL (ref 0.0–0.4)
Eos: 4 %
Hematocrit: 43.7 % (ref 34.0–46.6)
Hemoglobin: 14.2 g/dL (ref 11.1–15.9)
Immature Grans (Abs): 0 10*3/uL (ref 0.0–0.1)
Immature Granulocytes: 0 %
Lymphocytes Absolute: 2.4 10*3/uL (ref 0.7–3.1)
Lymphs: 31 %
MCH: 27.5 pg (ref 26.6–33.0)
MCHC: 32.5 g/dL (ref 31.5–35.7)
MCV: 85 fL (ref 79–97)
Monocytes Absolute: 0.4 10*3/uL (ref 0.1–0.9)
Monocytes: 5 %
Neutrophils Absolute: 4.7 10*3/uL (ref 1.4–7.0)
Neutrophils: 60 %
Platelets: 346 10*3/uL (ref 150–450)
RBC: 5.17 x10E6/uL (ref 3.77–5.28)
RDW: 13.4 % (ref 11.7–15.4)
WBC: 7.8 10*3/uL (ref 3.4–10.8)

## 2023-10-29 ENCOUNTER — Ambulatory Visit: Payer: Self-pay | Admitting: Allergy & Immunology

## 2023-10-30 ENCOUNTER — Telehealth: Payer: Self-pay | Admitting: *Deleted

## 2023-10-30 NOTE — Telephone Encounter (Signed)
-----   Message from [place] Chuck sent at 10/23/2023  8:46 AM EDT ----- Still interested in pursuing a bioloigic. She promised to answer the phone.

## 2023-10-30 NOTE — Telephone Encounter (Signed)
 Called patient and advised approval, copay card and submit to [employer] for Fasenra . She wants same in clinic will reach out once delivery set to make appt to restart same

## 2023-10-31 MED ORDER — FASENRA 30 MG/ML ~~LOC~~ SOSY
PREFILLED_SYRINGE | SUBCUTANEOUS | 9 refills | Status: AC
Start: 2023-10-31 — End: ?

## 2023-10-31 NOTE — Addendum Note (Signed)
 Addended by: Evangelina Hilt on: 10/31/2023 11:31 AM   Modules accepted: Orders

## 2023-10-31 NOTE — Telephone Encounter (Signed)
Thanks, Tammy!   Akeel Reffner, MD [facility]  

## 2023-12-05 ENCOUNTER — Encounter: Payer: 59 | Admitting: Nurse Practitioner

## 2023-12-13 ENCOUNTER — Telehealth: Admitting: Physician Assistant

## 2023-12-13 DIAGNOSIS — N760 Acute vaginitis: Secondary | ICD-10-CM

## 2023-12-13 DIAGNOSIS — N3090 Cystitis, unspecified without hematuria: Secondary | ICD-10-CM

## 2023-12-13 DIAGNOSIS — B9689 Other specified bacterial agents as the cause of diseases classified elsewhere: Secondary | ICD-10-CM | POA: Diagnosis not present

## 2023-12-13 MED ORDER — METRONIDAZOLE 500 MG PO TABS: 500.0000 mg | ORAL_TABLET | Freq: Two times a day (BID) | ORAL | 0 refills | Status: AC

## 2023-12-13 MED ORDER — NITROFURANTOIN MONOHYD MACRO 100 MG PO CAPS: 100.0000 mg | ORAL_CAPSULE | Freq: Two times a day (BID) | ORAL | 0 refills | Status: AC
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
## 2023-12-13 NOTE — Patient Instructions (Signed)
 Danielle Velez, thank you for joining Delon CHRISTELLA Dickinson, PA-C for today's virtual visit.  While this provider is not your primary care provider (PCP), if your PCP is located in our provider database this encounter information will be shared with them immediately following your visit.   A [facility] MyChart account gives you access to today's visit and all your visits, tests, and labs performed at [facility]  click here if you don't have a [facility] MyChart account or go to [email or web address]  Consent: (Patient) Danielle Velez provided verbal consent for this virtual visit at the beginning of the encounter.  Current Medications:  Current Outpatient Medications:    metroNIDAZOLE  (FLAGYL ) 500 MG tablet, Take 1 tablet (500 mg total) by mouth 2 (two) times daily for 7 days., Disp: 14 tablet, Rfl: 0   nitrofurantoin, macrocrystal-monohydrate, (MACROBID) 100 MG capsule, Take 1 capsule (100 mg total) by mouth 2 (two) times daily., Disp: 10 capsule, Rfl: 0   albuterol  (PROVENTIL ) (2.5 MG/3ML) 0.083% nebulizer solution, Take 3 mLs (2.5 mg total) by nebulization every 6 (six) hours as needed for wheezing or shortness of breath. (Patient not taking: Reported on 10/22/2023), Disp: 75 mL, Rfl: 1   albuterol  (VENTOLIN  HFA) 108 (90 Base) MCG/ACT inhaler, INHALE 2 PUFFS EVERY 4 HOURS AS NEEDED FOR COUGH, WHEEZE, TIGHTNESS IN CHEST, OR SHORTNESS OF BREATH, Disp: 6.7 each, Rfl: 1   benralizumab  (FASENRA ) 30 MG/ML prefilled syringe, Inject 1 syringe under the skin at week 0, week 4 and week 8 then inject 1 syringe every 8 weeks, Disp: 1 mL, Rfl: 9   budesonide -formoterol  (SYMBICORT ) 160-4.5 MCG/ACT inhaler, Inhale 2 puffs into the lungs 2 (two) times daily., Disp: 1 each, Rfl: 5   cetirizine  (ZYRTEC ) 10 MG tablet, Take 10 mg by mouth daily., Disp: , Rfl:    cyclobenzaprine  (FLEXERIL ) 5 MG tablet, Take 1 tablet (5 mg total) by mouth 3 (three) times daily as needed for muscle spasms., Disp: 90  tablet, Rfl: 5   EPINEPHrine  (EPIPEN  2-PAK) 0.3 mg/0.3 mL IJ SOAJ injection, Inject 0.3 mg into the muscle as needed for anaphylaxis. (Patient not taking: Reported on 10/22/2023), Disp: 1 each, Rfl: 1   etonogestrel (NEXPLANON) 68 MG IMPL implant, 1 each by Subdermal route once., Disp: , Rfl:    lidocaine  (LIDODERM ) 5 %, Place 2 patches onto the skin daily. Remove & Discard patch within 12 hours or as directed by MD. Apply daily to both shoulders., Disp: 60 patch, Rfl: 3   pregabalin  (LYRICA ) 25 MG capsule, Take 1 capsule (25 mg total) by mouth 2 (two) times daily. Start with 1 capsule at nighttime for 1 week, then increase to twice daily if no side effects, Disp: 60 capsule, Rfl: 3   topiramate  (TOPAMAX ) 50 MG tablet, Take 0.5 tablets (25 mg total) by mouth daily as needed (headaches). If tolerating medication well, can increase to 1 tab twice daily after 1 week, Disp: 30 tablet, Rfl: 2   valACYclovir  (VALTREX ) 1000 MG tablet, Take 2 tablets (2,000 mg total) by mouth 2 (two) times daily., Disp: 4 tablet, Rfl: 0  Current Facility-Administered Medications:    lidocaine  (XYLOCAINE ) 1 % (with pres) injection 6 mL, 6 mL, Other, Once,    triamcinolone  acetonide (KENALOG -40) injection 5.2 mg, 5.2 mg, Intramuscular, Once,    Medications ordered in this encounter:  Meds ordered this encounter  Medications   metroNIDAZOLE  (FLAGYL ) 500 MG tablet    Sig: Take 1 tablet (500 mg total) by mouth 2 (two) times daily  for 7 days.    Dispense:  14 tablet    Refill:  0    Supervising Provider:   BLAISE ALEENE KIDD [[id number]]   nitrofurantoin, macrocrystal-monohydrate, (MACROBID) 100 MG capsule    Sig: Take 1 capsule (100 mg total) by mouth 2 (two) times daily.    Dispense:  10 capsule    Refill:  0    Supervising Provider:   BLAISE ALEENE KIDD [[id number]]     *If you need refills on other medications prior to your next appointment, please contact your pharmacy*  Follow-Up: Call back or seek an in-person  evaluation if the symptoms worsen or if the condition fails to improve as anticipated.  [facility] [phone]  Other Instructions Vaginal Probiotics: AZO vaginal probiotic OLLY Happy Hoo-Ha RAW Vaginal Care RenewLife Women's vaginal probiotic RepHresh Pro-B  Vaginal washes: Honey Pot Summer's Eve Vagisil Feminine cleanser  Boric Acid Suppositories  Healthy vaginal hygiene practices    -  Avoid sleeper pajamas. Nightgowns allow air to circulate.  Sleep without underpants whenever possible.   -  Wear cotton underpants during the day. Double-rinse underwear after washing to avoid residual irritants. Do not use fabric softeners for underwear and swimsuits.   - Avoid tights, leotards, leggings, skinny jeans, and other tight-fitting clothing. Skirts and loose-fitting pants allow air to circulate.   - Avoid pantyliners.  Instead use tampons or cotton pads.   - Use the restroom after intercourse to help prevent UTI's   - Daily warm bathing is helpful:     - Soak in clean water  (no soap) for 10 to 15 minutes. Adding vinegar or baking soda to the water  has not been specifically studied and may not be better than clean water  alone.      - Use soap to wash regions other than the genital area just before getting out of the tub. Limit use of any soap on genital areas. Use fragance-free soaps.     - Rinse the genital area well and gently pat dry.  Don't rub.  Hair dryer to assist with drying can be used only if on cool setting.     - Do not use bubble baths or perfumed soaps.   - Do not use any feminine sprays, douches or powders.  These contain chemicals that will irritate the skin.   - If the genital area is tender or swollen, cool compresses may relieve the discomfort. Unscented wet wipes can be used instead of toilet paper for wiping.    - Emollients, such as Vaseline, may help protect skin and can be applied to the irritated area.   - Always remember to wipe  front-to-back after bowel movements. Pat dry after urination.   - Do not sit in wet swimsuits for long periods of time after swimming    If you have been instructed to have an in-person evaluation today at a local Urgent Care facility, please use the link below. It will take you to a list of all of our available [facility], including address, phone number and hours of operation. Please do not delay care.  [facility]  If you or a family member do not have a primary care provider, use the link below to schedule a visit and establish care. When you choose a [facility] primary care physician or advanced practice provider, you gain a long-term partner in health. Find a Primary Care Provider  Learn more about [facility] in-office and virtual  care options: [facility] - Get Care Now

## 2023-12-24 ENCOUNTER — Ambulatory Visit (INDEPENDENT_AMBULATORY_CARE_PROVIDER_SITE_OTHER)

## 2023-12-24 DIAGNOSIS — J4550 Severe persistent asthma, uncomplicated: Secondary | ICD-10-CM | POA: Diagnosis not present

## 2023-12-24 MED ORDER — BENRALIZUMAB 30 MG/ML ~~LOC~~ SOSY
30.0000 mg | PREFILLED_SYRINGE | SUBCUTANEOUS | Status: AC
  Administered 2023-12-24 – 2024-02-25 (×4): 30 mg via SUBCUTANEOUS

## 2023-12-24 NOTE — Progress Notes (Signed)
 Immunotherapy   Patient Details  Name: Queena Monrreal MRN: [id number] Date of Birth: 06/28/1990  12/24/2023  Mahalia Masters started injections for Asthma. Patient received 30 mg Fasenra  and waited 15 minutes with no problems.  Frequency: every 4 weeks times 3 doses then every 8 weeks Consent signed and patient instructions given.   Rosina LOISE Irving 12/24/2023, 10:11 AM

## 2024-01-14 ENCOUNTER — Encounter: Payer: Self-pay | Admitting: Physical Medicine and Rehabilitation

## 2024-01-21 ENCOUNTER — Ambulatory Visit: Admitting: Allergy & Immunology

## 2024-01-21 ENCOUNTER — Ambulatory Visit

## 2024-01-24 NOTE — Telephone Encounter (Signed)
 Thank you :)

## 2024-01-28 ENCOUNTER — Encounter: Payer: Self-pay | Admitting: Nurse Practitioner

## 2024-01-28 ENCOUNTER — Ambulatory Visit: Admitting: Nurse Practitioner

## 2024-01-28 ENCOUNTER — Other Ambulatory Visit (HOSPITAL_COMMUNITY)
Admission: RE | Admit: 2024-01-28 | Discharge: 2024-01-28 | Disposition: A | Discharge status: Home / Self Care | Source: Ambulatory Visit | Attending: Nurse Practitioner | Admitting: Nurse Practitioner

## 2024-01-28 ENCOUNTER — Ambulatory Visit: Admitting: *Deleted

## 2024-01-28 ENCOUNTER — Ambulatory Visit (INDEPENDENT_AMBULATORY_CARE_PROVIDER_SITE_OTHER): Admitting: Allergy & Immunology

## 2024-01-28 VITALS — BP 122/78 | HR 63 | Temp 98.5°F | Ht 60.0 in | Wt 236.4 lb

## 2024-01-28 DIAGNOSIS — Z1159 Encounter for screening for other viral diseases: Secondary | ICD-10-CM | POA: Insufficient documentation

## 2024-01-28 DIAGNOSIS — J302 Other seasonal allergic rhinitis: Secondary | ICD-10-CM

## 2024-01-28 DIAGNOSIS — Z0001 Encounter for general adult medical examination with abnormal findings: Secondary | ICD-10-CM | POA: Diagnosis not present

## 2024-01-28 DIAGNOSIS — J3089 Other allergic rhinitis: Secondary | ICD-10-CM

## 2024-01-28 DIAGNOSIS — K219 Gastro-esophageal reflux disease without esophagitis: Secondary | ICD-10-CM | POA: Diagnosis not present

## 2024-01-28 DIAGNOSIS — N898 Other specified noninflammatory disorders of vagina: Secondary | ICD-10-CM | POA: Insufficient documentation

## 2024-01-28 DIAGNOSIS — T7800XD Anaphylactic reaction due to unspecified food, subsequent encounter: Secondary | ICD-10-CM | POA: Diagnosis not present

## 2024-01-28 DIAGNOSIS — J4550 Severe persistent asthma, uncomplicated: Secondary | ICD-10-CM | POA: Diagnosis not present

## 2024-01-28 DIAGNOSIS — E7800 Pure hypercholesterolemia, unspecified: Secondary | ICD-10-CM

## 2024-01-28 DIAGNOSIS — R7303 Prediabetes: Secondary | ICD-10-CM | POA: Diagnosis not present

## 2024-01-28 DIAGNOSIS — L501 Idiopathic urticaria: Secondary | ICD-10-CM

## 2024-01-28 MED ORDER — BUDESONIDE-FORMOTEROL FUMARATE 160-4.5 MCG/ACT IN AERO: 2.0000 | INHALATION_SPRAY | Freq: Two times a day (BID) | RESPIRATORY_TRACT | 5 refills | Status: AC

## 2024-01-28 MED ORDER — ALBUTEROL SULFATE HFA 108 (90 BASE) MCG/ACT IN AERS: INHALATION_SPRAY | RESPIRATORY_TRACT | 1 refills | Status: AC

## 2024-01-28 MED ORDER — CETIRIZINE HCL 10 MG PO TABS: 10.0000 mg | ORAL_TABLET | Freq: Every day | ORAL | 3 refills | Status: AC
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
## 2024-01-28 NOTE — Patient Instructions (Addendum)
 Go to lab Maintain Heart healthy diet and daily exercise. Have Pap smear faxed to me once completed
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
## 2024-01-28 NOTE — Patient Instructions (Addendum)
 1. 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  - We are not going to make any changes.  - We are going to continue with the Fasenra .  - Daily controller medication(s): Symbicort  160/4.5 two puffs twice daily with spacer and Fasenra  every 8 weeks - Rescue medications: albuterol  4 puffs every 4-6 hours as needed or albuterol  nebulizer one vial puffs every 4-6 hours as needed - Asthma control goals:  * Full participation in all desired activities (may need albuterol  before activity) * Albuterol  use two time or less a week on average (not counting use with activity) * Cough interfering with sleep two time or less a month * Oral steroids no more than once a year * No hospitalizations  2. Allergic rhinitis - Continue with Zyrtec  (cetirizine ) 10mg  daily. - Continue with Flonase  one spray per nostril daily.    3. Food allergies (shellfish and cow's milk) - EpiPen  is up to date.  - Continue to avoid your triggering foods.   4. Return in about 6 months (around 07/27/2024). You can have the follow up appointment with Dr. Iva or a Nurse Practicioner (our Nurse Practitioners are excellent and always have Physician oversight!).    Please inform us  of any Emergency Department visits, hospitalizations, or changes in symptoms. Call us  before going to the ED for breathing or allergy symptoms since we might be able to fit you in for a sick visit. Feel free to contact us  anytime with any questions, problems, or concerns.  It was a pleasure to see you again today!  Websites that have reliable patient information: 1. American Academy of Asthma, Allergy, and Immunology: www.aaaai.org 2. Food Allergy Research and Education (FARE): foodallergy.org 3. Mothers of Asthmatics: http://www.asthmacommunitynetwork.org 4. American College of Allergy, Asthma, and Immunology: www.acaai.org      "Like" us  on Facebook and Instagram for our latest updates!       A healthy democracy works best when [employer] participate! Make sure you are registered to vote! If you have moved or changed any of your contact information, you will need to get this updated before voting! Scan the QR codes below to learn more!

## 2024-01-29 ENCOUNTER — Encounter: Payer: Self-pay | Admitting: Allergy & Immunology

## 2024-01-30 LAB — CERVICOVAGINAL ANCILLARY ONLY
Bacterial Vaginitis (gardnerella): POSITIVE — AB
Candida Glabrata: NEGATIVE
Candida Vaginitis: NEGATIVE
Chlamydia: NEGATIVE
Comment: NEGATIVE
Comment: NEGATIVE
Comment: NEGATIVE
Comment: NEGATIVE
Comment: NEGATIVE
Comment: NORMAL
Neisseria Gonorrhea: NEGATIVE
Trichomonas: NEGATIVE

## 2024-01-31 ENCOUNTER — Ambulatory Visit: Payer: Self-pay | Admitting: Nurse Practitioner

## 2024-01-31 DIAGNOSIS — B9689 Other specified bacterial agents as the cause of diseases classified elsewhere: Secondary | ICD-10-CM

## 2024-01-31 MED ORDER — METRONIDAZOLE 0.75 % VA GEL: 1.0000 | Freq: Every day | VAGINAL | 0 refills | Status: DC

## 2024-02-03 MED ORDER — METRONIDAZOLE 500 MG PO TABS: 500.0000 mg | ORAL_TABLET | Freq: Two times a day (BID) | ORAL | 0 refills | Status: AC

## 2024-02-25 ENCOUNTER — Ambulatory Visit (INDEPENDENT_AMBULATORY_CARE_PROVIDER_SITE_OTHER)

## 2024-02-25 ENCOUNTER — Ambulatory Visit

## 2024-02-25 DIAGNOSIS — J4550 Severe persistent asthma, uncomplicated: Secondary | ICD-10-CM

## 2024-03-03 ENCOUNTER — Telehealth: Admitting: Physician Assistant

## 2024-03-03 ENCOUNTER — Ambulatory Visit: Admitting: Nurse Practitioner

## 2024-03-03 DIAGNOSIS — N760 Acute vaginitis: Secondary | ICD-10-CM | POA: Diagnosis not present

## 2024-03-03 DIAGNOSIS — B9689 Other specified bacterial agents as the cause of diseases classified elsewhere: Secondary | ICD-10-CM

## 2024-03-03 MED ORDER — METRONIDAZOLE 500 MG PO TABS: 500.0000 mg | ORAL_TABLET | Freq: Two times a day (BID) | ORAL | 0 refills | Status: AC

## 2024-03-03 NOTE — Progress Notes (Signed)

## 2024-03-03 NOTE — Progress Notes (Signed)
 Message sent to patient requesting further input regarding current symptoms. Awaiting patient response.

## 2024-03-16 ENCOUNTER — Telehealth: Payer: Self-pay | Admitting: Nurse Practitioner

## 2024-03-16 NOTE — Telephone Encounter (Signed)
 04/09/2023 no show 03/03/2024 late arrival  Final warning sent via mail and mychart

## 2024-04-21 ENCOUNTER — Ambulatory Visit

## 2024-04-21 ENCOUNTER — Telehealth: Payer: Self-pay | Admitting: Nurse Practitioner

## 2024-04-21 NOTE — Telephone Encounter (Signed)
 Called and left a voice message for Lorenza asking to give me a call back at the office at [phone]

## 2024-04-21 NOTE — Telephone Encounter (Signed)
 Copied from CRM #[id number]. Topic: Medical Record Request - Other >> Apr 20, 2024  4:22 PM Dedra B wrote: Reason for CRM: Tasha from [facility] called to see if medical records request was received. Informed her that medical records are handled by medical records department. She still would like to know if it was received. Lorenza can be reached at [phone] ext [phone]

## 2024-04-22 NOTE — Telephone Encounter (Signed)
 Copied from CRM #[id number]. Topic: Medical Record Request - Other >> Apr 20, 2024  4:22 PM Dedra B wrote: Reason for CRM: Tasha from [facility] called to see if medical records request was received. Informed her that medical records are handled by medical records department. She still would like to know if it was received. Lorenza can be reached at [phone] ext [phone] >> Apr 22, 2024  8:06 AM Jamee HERO wrote: Called and left a voice message  for Lorenza asking to give me a call back at the office at [phone] From Joese[place]     >> Apr 21, 2024  4:53 PM Drema MATSU wrote: Lorenza is returning a message from Terrah. Please return her call regarding medical records.

## 2024-04-22 NOTE — Telephone Encounter (Signed)
 Copied from CRM #[id number]. Topic: Medical Record Request - Other >> Apr 20, 2024  4:22 PM Dedra B wrote: Reason for CRM: Tasha from [facility] called to see if medical records request was received. Informed her that medical records are handled by medical records department. She still would like to know if it was received. Lorenza can be reached at [phone] ext [phone] >> Apr 22, 2024  3:31 PM Porter L wrote: Lorenza calling for a update  >> Apr 22, 2024  9:31 AM Tillman MATSU wrote: I have added this to a tele encounter.  >> Apr 22, 2024  8:06 AM Jamee HERO wrote: Called and left a voice message  for Lorenza asking to give me a call back at the office at [phone] From Joeseph     >> Apr 21, 2024  4:53 PM Drema MATSU wrote: Lorenza is returning a message from Terrah. Please return her call regarding medical records.

## 2024-04-23 NOTE — Telephone Encounter (Signed)
 Called [facility] and spoke with Tasha. I informed her that our Medical Records did receive the request for records and are working on it. Informed her that it will take some time to get the requested information to her.

## 2024-04-27 ENCOUNTER — Encounter: Payer: Self-pay | Admitting: Physical Medicine and Rehabilitation

## 2024-04-27 ENCOUNTER — Encounter: Attending: Physical Medicine and Rehabilitation | Admitting: Physical Medicine and Rehabilitation

## 2024-04-27 DIAGNOSIS — M542 Cervicalgia: Secondary | ICD-10-CM | POA: Insufficient documentation

## 2024-04-27 DIAGNOSIS — S134XXS Sprain of ligaments of cervical spine, sequela: Secondary | ICD-10-CM | POA: Insufficient documentation

## 2024-04-27 DIAGNOSIS — R519 Headache, unspecified: Secondary | ICD-10-CM | POA: Insufficient documentation

## 2024-04-27 DIAGNOSIS — M7522 Bicipital tendinitis, left shoulder: Secondary | ICD-10-CM

## 2024-04-27 DIAGNOSIS — M7918 Myalgia, other site: Secondary | ICD-10-CM | POA: Diagnosis present

## 2024-04-27 MED ORDER — MELOXICAM 7.5 MG PO TABS: 7.5000 mg | ORAL_TABLET | Freq: Every day | ORAL | 0 refills | Status: AC
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
## 2024-05-25 ENCOUNTER — Encounter: Payer: Self-pay | Admitting: Physical Medicine and Rehabilitation

## 2024-05-25 ENCOUNTER — Encounter: Attending: Physical Medicine and Rehabilitation | Admitting: Physical Medicine and Rehabilitation

## 2024-05-25 VITALS — BP 107/72 | HR 73 | Ht 60.0 in | Wt 224.0 lb

## 2024-05-25 DIAGNOSIS — R7303 Prediabetes: Secondary | ICD-10-CM | POA: Insufficient documentation

## 2024-05-25 DIAGNOSIS — M7522 Bicipital tendinitis, left shoulder: Secondary | ICD-10-CM | POA: Diagnosis not present

## 2024-05-25 DIAGNOSIS — K219 Gastro-esophageal reflux disease without esophagitis: Secondary | ICD-10-CM | POA: Insufficient documentation

## 2024-05-25 DIAGNOSIS — G40909 Epilepsy, unspecified, not intractable, without status epilepticus: Secondary | ICD-10-CM | POA: Insufficient documentation

## 2024-05-25 DIAGNOSIS — M779 Enthesopathy, unspecified: Secondary | ICD-10-CM | POA: Insufficient documentation

## 2024-05-25 DIAGNOSIS — D573 Sickle-cell trait: Secondary | ICD-10-CM | POA: Insufficient documentation

## 2024-05-25 DIAGNOSIS — F332 Major depressive disorder, recurrent severe without psychotic features: Secondary | ICD-10-CM | POA: Insufficient documentation

## 2024-05-25 MED ORDER — LIDOCAINE HCL 1 % IJ SOLN
1.0000 mL | Freq: Once | INTRAMUSCULAR | Status: AC
  Administered 2024-05-25: 1 mL

## 2024-05-25 MED ORDER — TRIAMCINOLONE ACETONIDE 40 MG/ML IJ SUSP
40.0000 mg | Freq: Once | INTRAMUSCULAR | Status: AC
  Administered 2024-05-25: 40 mg via INTRAMUSCULAR
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
## 2024-05-25 NOTE — Patient Instructions (Signed)
-   Resume Usual Activities. Notify Physician of any unusual bleeding, erythema or concern for side effects as reviewed above. - Apply ice prn for pain - Tylenol  prn for pain - Follow up in as scheduled in 2-3 weeks to assess response to injection - if no improvement will consider EMG LUE

## 2024-06-08 ENCOUNTER — Encounter: Admitting: Physical Medicine and Rehabilitation

## 2024-06-15 ENCOUNTER — Encounter: Admitting: Physical Medicine and Rehabilitation

## 2024-07-28 ENCOUNTER — Ambulatory Visit: Admitting: Allergy & Immunology

## 2025-02-01 ENCOUNTER — Encounter: Admitting: Nurse Practitioner
